# Patient Record
Sex: Female | Born: 1945
Health system: Southern US, Community
[De-identification: ages and names within clinical notes are randomized; demographics above are authoritative.]

## PROBLEM LIST (undated history)

## (undated) DIAGNOSIS — I10 Essential (primary) hypertension: Secondary | ICD-10-CM

## (undated) DIAGNOSIS — M1990 Unspecified osteoarthritis, unspecified site: Secondary | ICD-10-CM

## (undated) DIAGNOSIS — R7303 Prediabetes: Secondary | ICD-10-CM

## (undated) DIAGNOSIS — E785 Hyperlipidemia, unspecified: Secondary | ICD-10-CM

## (undated) DIAGNOSIS — E8881 Metabolic syndrome: Secondary | ICD-10-CM

## (undated) DIAGNOSIS — A0472 Enterocolitis due to Clostridium difficile, not specified as recurrent: Principal | ICD-10-CM

## (undated) DIAGNOSIS — Z8619 Personal history of other infectious and parasitic diseases: Secondary | ICD-10-CM

## (undated) HISTORY — PX: FRACTURE SURGERY: SHX138

## (undated) HISTORY — PX: ABDOMINAL HYSTERECTOMY: SHX81

## (undated) HISTORY — DX: Metabolic syndrome: E88.810

## (undated) HISTORY — DX: Hyperlipidemia, unspecified: E78.5

## (undated) HISTORY — DX: Prediabetes: R73.03

## (undated) HISTORY — DX: Unspecified osteoarthritis, unspecified site: M19.90

## (undated) HISTORY — PX: ANKLE SURGERY: SHX546

## (undated) HISTORY — DX: Metabolic syndrome: E88.81

## (undated) HISTORY — DX: Personal history of other infectious and parasitic diseases: Z86.19

## (undated) HISTORY — DX: Enterocolitis due to Clostridium difficile, not specified as recurrent: A04.72

---

## 2001-08-12 ENCOUNTER — Encounter: Payer: Self-pay | Admitting: Family Medicine

## 2001-08-12 ENCOUNTER — Ambulatory Visit (HOSPITAL_COMMUNITY): Admission: RE | Admit: 2001-08-12 | Discharge: 2001-08-12 | Payer: Self-pay | Admitting: Family Medicine

## 2002-06-02 ENCOUNTER — Ambulatory Visit (HOSPITAL_COMMUNITY): Admission: RE | Admit: 2002-06-02 | Discharge: 2002-06-02 | Payer: Self-pay | Admitting: Family Medicine

## 2002-06-02 ENCOUNTER — Encounter: Payer: Self-pay | Admitting: Family Medicine

## 2002-09-09 ENCOUNTER — Encounter: Payer: Self-pay | Admitting: Family Medicine

## 2002-09-09 ENCOUNTER — Ambulatory Visit (HOSPITAL_COMMUNITY): Admission: RE | Admit: 2002-09-09 | Discharge: 2002-09-09 | Payer: Self-pay | Admitting: Family Medicine

## 2003-05-05 ENCOUNTER — Ambulatory Visit (HOSPITAL_COMMUNITY): Admission: RE | Admit: 2003-05-05 | Discharge: 2003-05-05 | Payer: Self-pay | Admitting: Unknown Physician Specialty

## 2003-05-05 IMAGING — US US SOFT TISSUE HEAD/NECK
1 series · 14 of 25 positions shown · non-contrast
Comparison: none

CLINICAL DATA: Enlarged thyroid.
 ULTRASOUND SOFT TISSUE NECK
 Multiple scans of the thyroid show the right lobe of the thyroid to measure 5.8 x 2.4 x 2.2 cm.  There are multiple nodules seen, the largest measures 1.6 x 1.6 x 2.1 cm.  No cystic areas are seen.  The isthmus thyroid measures 8 mm in thickness and shows no evidence of abnormality.  The left lobe of the thyroid measures 7.3 x 3.2 x 3.3 cm and has multiple nodular areas, but no cystic regions.  
 IMPRESSION
 Multinodular goiter as described above.

[Series 1: unknown · 0.11mm/px · 14 of 45 slices shown]
[im 1/45]
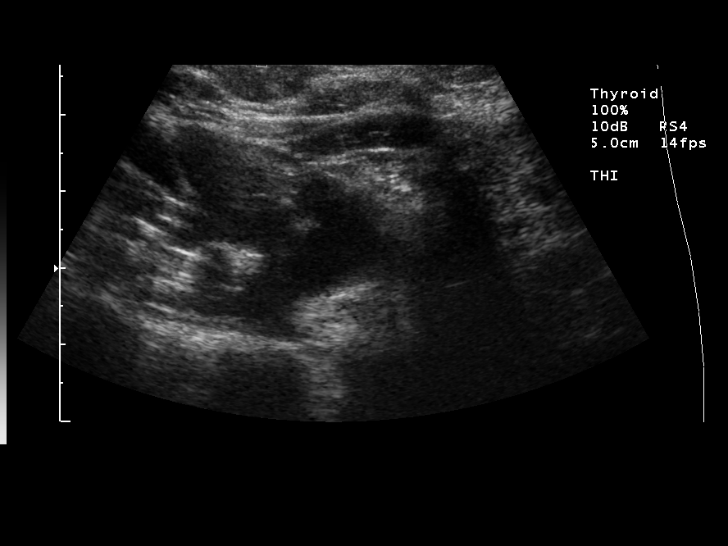
[im 4/45]
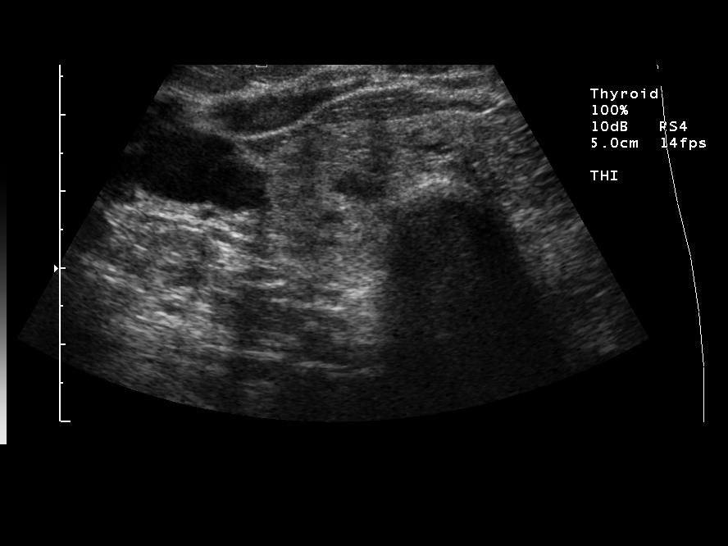
[im 8/45]
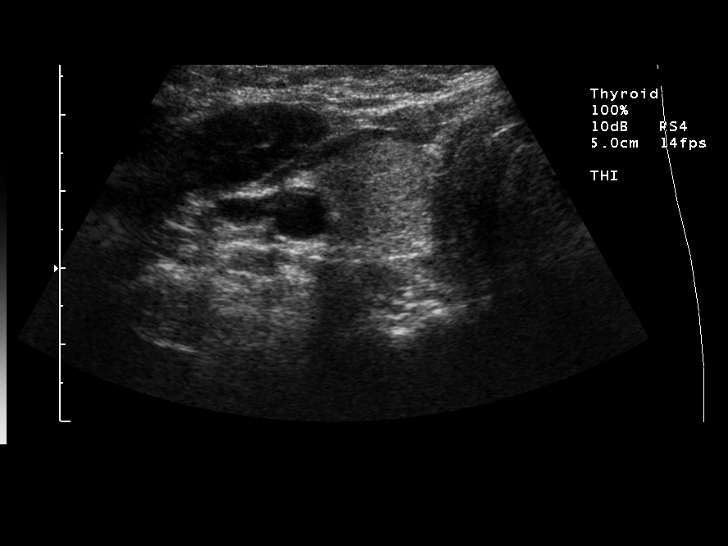
[im 12/45]
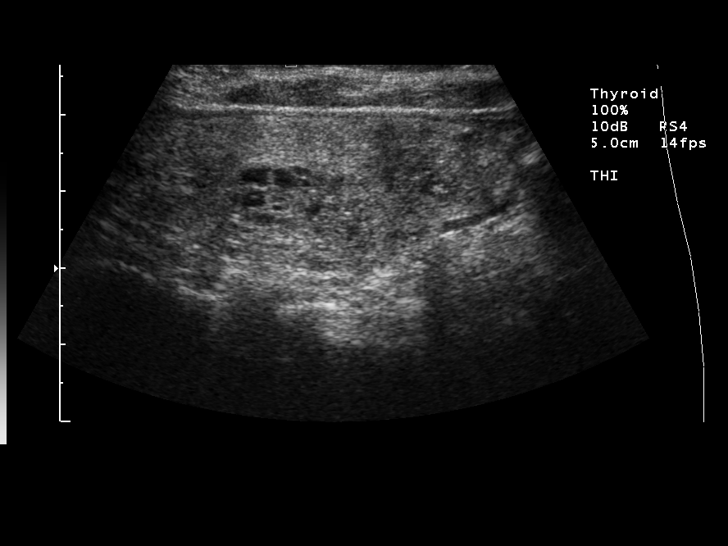
[im 15/45]
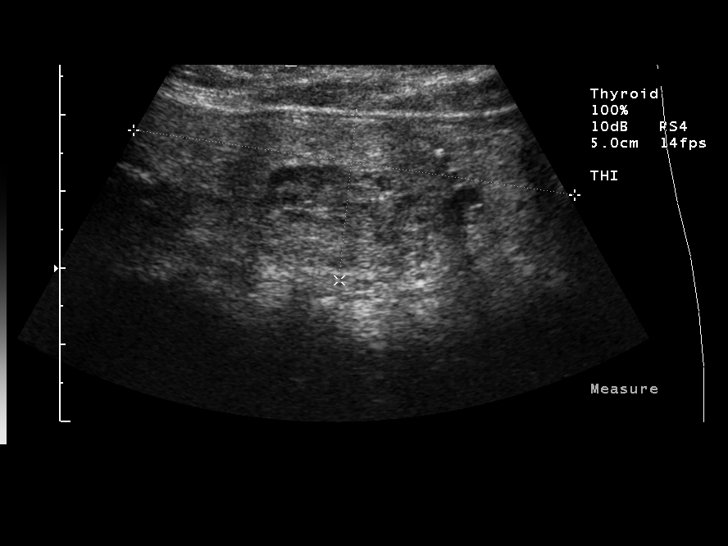
[im 17/45]
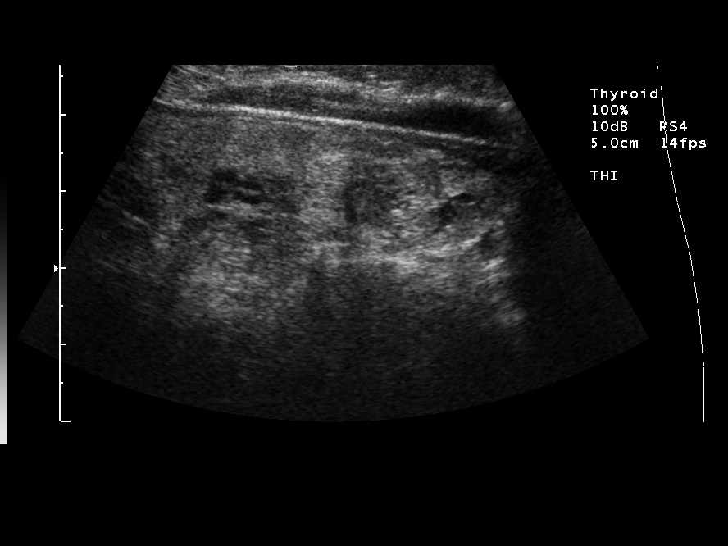
[im 21/45]
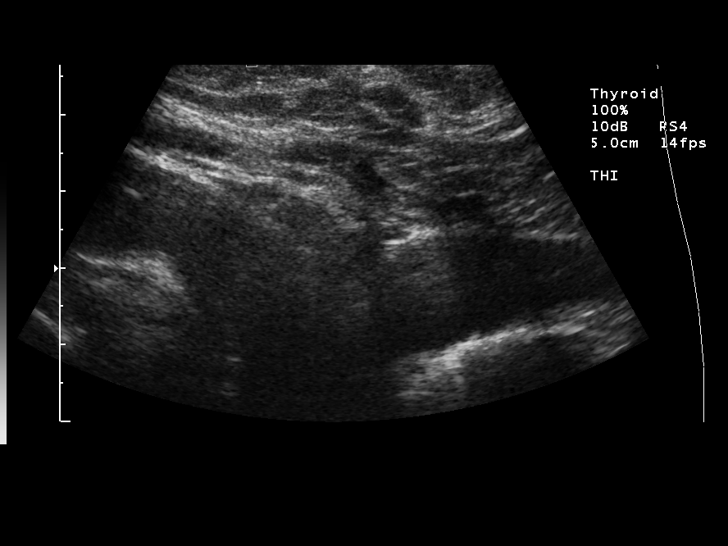
[im 24/45]
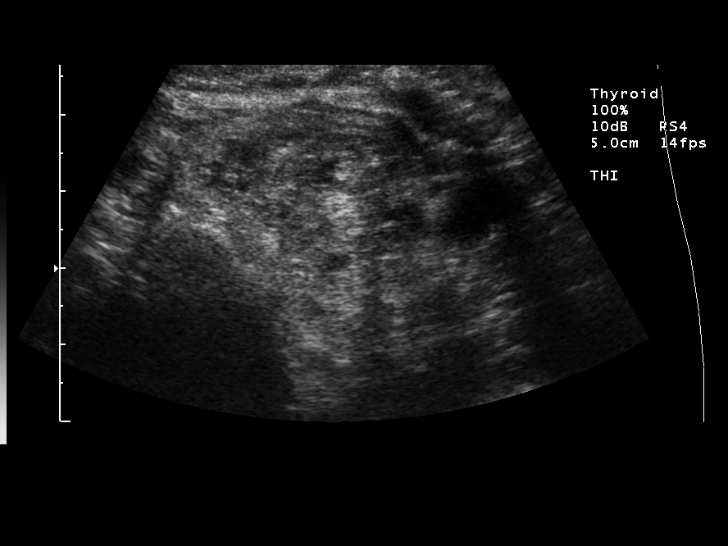
[im 28/45]
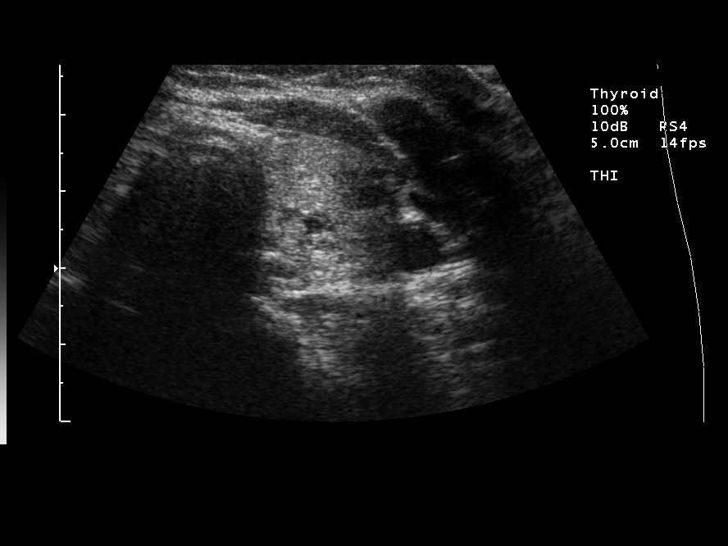
[im 30/45]
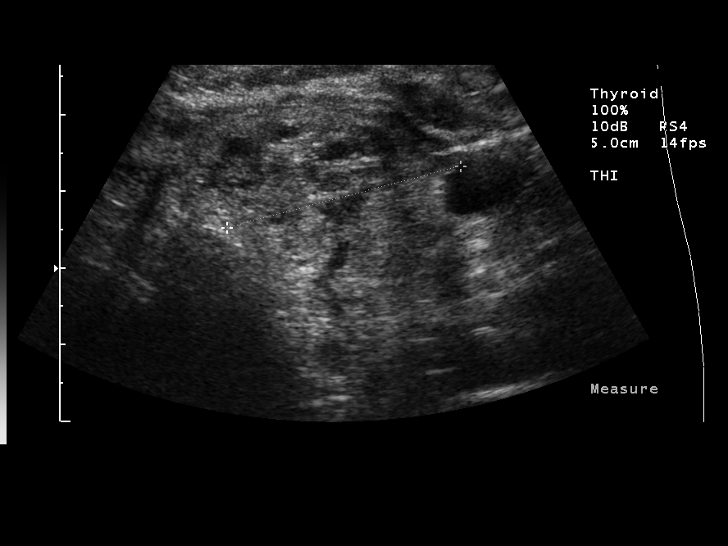
[im 34/45]
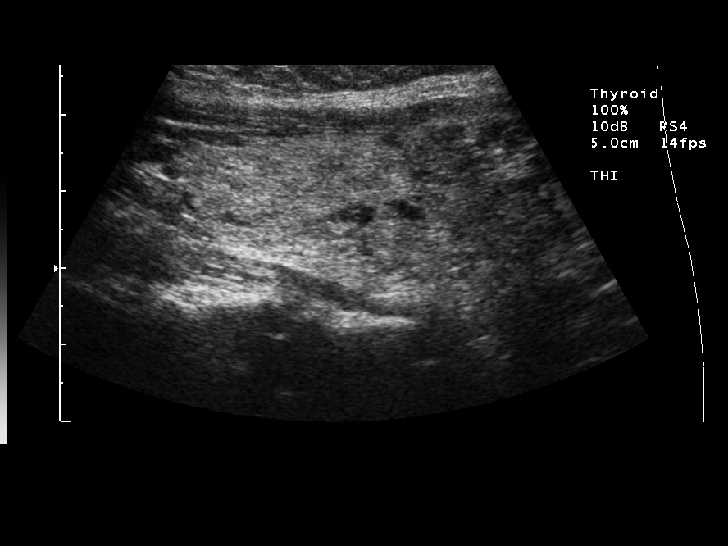
[im 37/45]
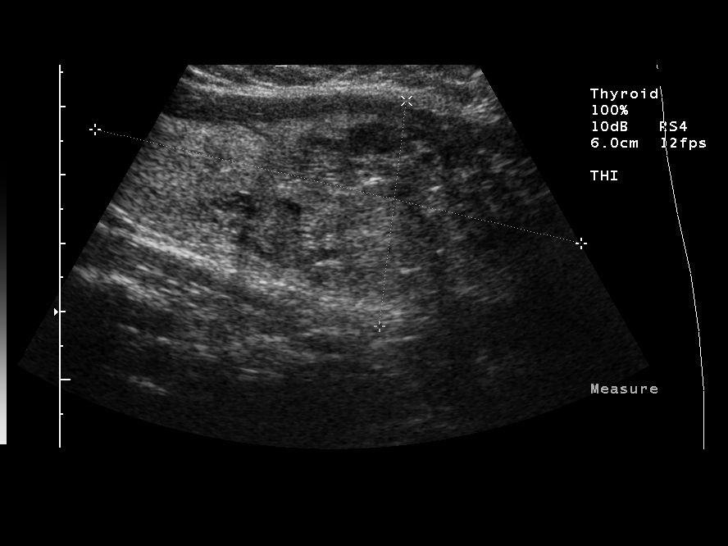
[im 41/45]
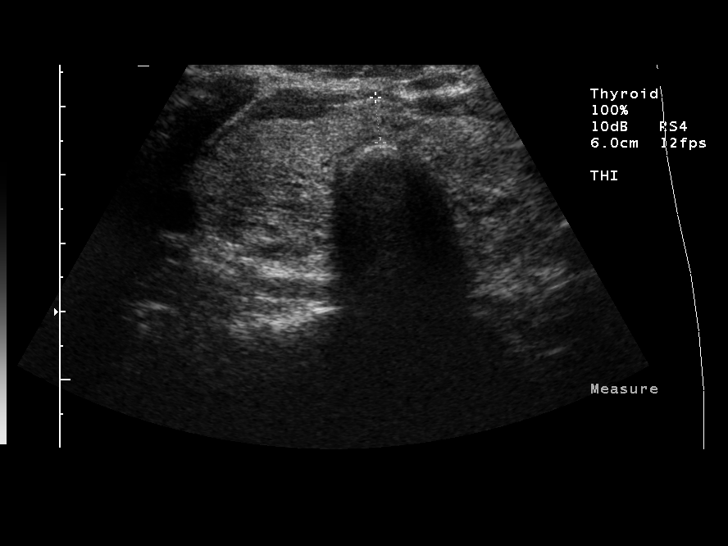
[im 45/45]
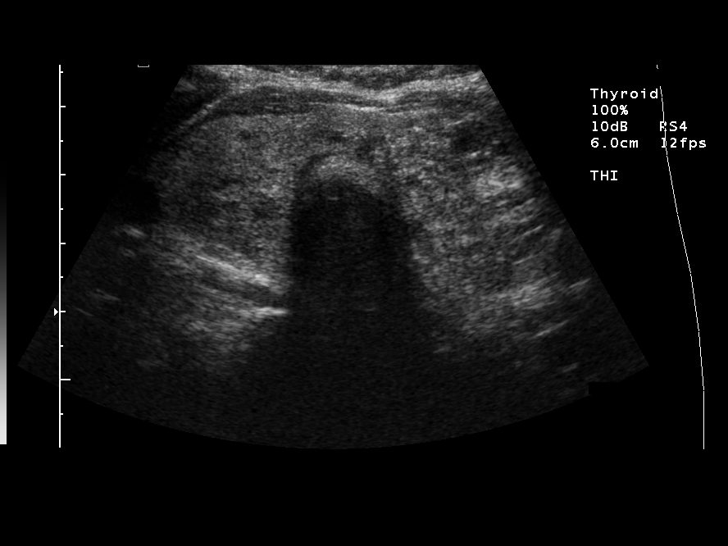

[14 of 25 positions shown; findings below may reference images not displayed]

## 2003-09-11 ENCOUNTER — Ambulatory Visit (HOSPITAL_COMMUNITY): Admission: RE | Admit: 2003-09-11 | Discharge: 2003-09-11 | Payer: Self-pay | Admitting: Family Medicine

## 2004-04-27 ENCOUNTER — Ambulatory Visit: Payer: Self-pay | Admitting: Family Medicine

## 2004-08-25 ENCOUNTER — Ambulatory Visit: Payer: Self-pay | Admitting: Family Medicine

## 2004-11-10 ENCOUNTER — Ambulatory Visit (HOSPITAL_COMMUNITY): Admission: RE | Admit: 2004-11-10 | Discharge: 2004-11-10 | Payer: Self-pay | Admitting: Family Medicine

## 2004-12-26 ENCOUNTER — Ambulatory Visit: Payer: Self-pay | Admitting: Family Medicine

## 2005-03-28 ENCOUNTER — Ambulatory Visit: Payer: Self-pay | Admitting: Family Medicine

## 2005-04-27 ENCOUNTER — Encounter: Payer: Self-pay | Admitting: Internal Medicine

## 2005-04-27 ENCOUNTER — Ambulatory Visit (HOSPITAL_COMMUNITY): Admission: RE | Admit: 2005-04-27 | Discharge: 2005-04-27 | Payer: Self-pay | Admitting: Internal Medicine

## 2005-04-27 ENCOUNTER — Ambulatory Visit: Payer: Self-pay | Admitting: Internal Medicine

## 2005-04-27 HISTORY — PX: COLONOSCOPY: SHX174

## 2005-08-24 ENCOUNTER — Ambulatory Visit: Payer: Self-pay | Admitting: Family Medicine

## 2005-11-13 ENCOUNTER — Ambulatory Visit (HOSPITAL_COMMUNITY): Admission: RE | Admit: 2005-11-13 | Discharge: 2005-11-13 | Payer: Self-pay | Admitting: Family Medicine

## 2005-12-25 ENCOUNTER — Ambulatory Visit: Payer: Self-pay | Admitting: Family Medicine

## 2006-03-29 ENCOUNTER — Ambulatory Visit: Payer: Self-pay | Admitting: Family Medicine

## 2006-05-10 ENCOUNTER — Ambulatory Visit: Payer: Self-pay | Admitting: Family Medicine

## 2006-09-04 ENCOUNTER — Encounter: Payer: Self-pay | Admitting: Family Medicine

## 2006-09-04 LAB — CONVERTED CEMR LAB
ALT: 20 units/L (ref 0–35)
BUN: 17 mg/dL (ref 6–23)
Bilirubin, Direct: 0.2 mg/dL (ref 0.0–0.3)
CO2: 26 meq/L (ref 19–32)
Chloride: 105 meq/L (ref 96–112)
Creatinine, Ser: 0.74 mg/dL (ref 0.40–1.20)
Glucose, Bld: 86 mg/dL (ref 70–99)
Potassium: 3.8 meq/L (ref 3.5–5.3)
Sodium: 143 meq/L (ref 135–145)
Total Protein: 7.2 g/dL (ref 6.0–8.3)
VLDL: 13 mg/dL (ref 0–40)

## 2007-12-28 ENCOUNTER — Encounter: Payer: Self-pay | Admitting: Family Medicine

## 2007-12-28 LAB — CONVERTED CEMR LAB
Alkaline Phosphatase: 67 units/L (ref 39–117)
BUN: 15 mg/dL (ref 6–23)
Bilirubin, Direct: 0.1 mg/dL (ref 0.0–0.3)
CO2: 22 meq/L (ref 19–32)
Chloride: 108 meq/L (ref 96–112)
Indirect Bilirubin: 0.4 mg/dL (ref 0.0–0.9)
LDL Cholesterol: 89 mg/dL (ref 0–99)
Sodium: 140 meq/L (ref 135–145)
Total Bilirubin: 0.5 mg/dL (ref 0.3–1.2)
Total Protein: 7.1 g/dL (ref 6.0–8.3)

## 2008-01-02 DIAGNOSIS — I10 Essential (primary) hypertension: Secondary | ICD-10-CM | POA: Insufficient documentation

## 2008-01-02 DIAGNOSIS — E669 Obesity, unspecified: Secondary | ICD-10-CM | POA: Insufficient documentation

## 2008-01-02 DIAGNOSIS — E785 Hyperlipidemia, unspecified: Secondary | ICD-10-CM | POA: Insufficient documentation

## 2008-01-06 ENCOUNTER — Ambulatory Visit: Payer: Self-pay | Admitting: Family Medicine

## 2008-01-22 ENCOUNTER — Ambulatory Visit (HOSPITAL_COMMUNITY): Admission: RE | Admit: 2008-01-22 | Discharge: 2008-01-22 | Payer: Self-pay | Admitting: Family Medicine

## 2008-04-17 ENCOUNTER — Ambulatory Visit: Payer: Self-pay | Admitting: Family Medicine

## 2008-05-21 ENCOUNTER — Telehealth: Payer: Self-pay | Admitting: Family Medicine

## 2008-05-25 ENCOUNTER — Encounter: Payer: Self-pay | Admitting: Family Medicine

## 2008-05-27 ENCOUNTER — Telehealth: Payer: Self-pay | Admitting: Family Medicine

## 2008-05-28 LAB — CONVERTED CEMR LAB
Alkaline Phosphatase: 82 units/L (ref 39–117)
Basophils Absolute: 0 10*3/uL (ref 0.0–0.1)
Bilirubin, Direct: 0.1 mg/dL (ref 0.0–0.3)
CO2: 23 meq/L (ref 19–32)
Calcium: 9.3 mg/dL (ref 8.4–10.5)
Creatinine, Ser: 0.74 mg/dL (ref 0.40–1.20)
Eosinophils Absolute: 0.1 10*3/uL (ref 0.0–0.7)
Eosinophils Relative: 1 % (ref 0–5)
Glucose, Bld: 98 mg/dL (ref 70–99)
HDL: 49 mg/dL (ref >39)
Hemoglobin: 13.4 g/dL (ref 12.0–15.0)
Lymphocytes Relative: 41 % (ref 12–46)
Lymphs Abs: 1.8 10*3/uL (ref 0.7–4.0)
Monocytes Absolute: 0.4 10*3/uL (ref 0.1–1.0)
Monocytes Relative: 10 % (ref 3–12)
Neutro Abs: 2.1 10*3/uL (ref 1.7–7.7)
Neutrophils Relative %: 47 % (ref 43–77)
Platelets: 362 10*3/uL (ref 150–400)
Potassium: 4.3 meq/L (ref 3.5–5.3)
RBC: 4.72 M/uL (ref 3.87–5.11)
Sodium: 141 meq/L (ref 135–145)
Total Bilirubin: 0.5 mg/dL (ref 0.3–1.2)
Total CHOL/HDL Ratio: 4.1
Triglycerides: 77 mg/dL (ref <150)
WBC: 4.4 10*3/uL (ref 4.0–10.5)

## 2008-06-17 ENCOUNTER — Ambulatory Visit: Payer: Self-pay | Admitting: Family Medicine

## 2008-08-10 ENCOUNTER — Encounter: Payer: Self-pay | Admitting: Family Medicine

## 2008-11-03 ENCOUNTER — Telehealth: Payer: Self-pay | Admitting: Family Medicine

## 2008-11-16 ENCOUNTER — Encounter: Payer: Self-pay | Admitting: Family Medicine

## 2008-11-16 LAB — CONVERTED CEMR LAB
ALT: 29 units/L (ref 0–35)
Alkaline Phosphatase: 71 units/L (ref 39–117)
BUN: 14 mg/dL (ref 6–23)
Bilirubin, Direct: 0.1 mg/dL (ref 0.0–0.3)
Calcium: 8.8 mg/dL (ref 8.4–10.5)
Glucose, Bld: 90 mg/dL (ref 70–99)
HDL: 47 mg/dL (ref >39)
LDL Cholesterol: 151 mg/dL — ABNORMAL HIGH (ref 0–99)
Potassium: 4.3 meq/L (ref 3.5–5.3)
Sodium: 144 meq/L (ref 135–145)
Total Bilirubin: 0.5 mg/dL (ref 0.3–1.2)
Total Protein: 6.9 g/dL (ref 6.0–8.3)
VLDL: 13 mg/dL (ref 0–40)

## 2008-12-02 ENCOUNTER — Ambulatory Visit: Payer: Self-pay | Admitting: Family Medicine

## 2009-01-25 ENCOUNTER — Encounter: Payer: Self-pay | Admitting: Family Medicine

## 2009-02-08 ENCOUNTER — Ambulatory Visit (HOSPITAL_COMMUNITY): Admission: RE | Admit: 2009-02-08 | Discharge: 2009-02-08 | Payer: Self-pay | Admitting: Family Medicine

## 2009-02-08 IMAGING — MG MM DIGITAL SCREENING
4 series · 4 of 4 positions shown · non-contrast
Comparison: Prior studies.

DG SCREEN MAMMOGRAM BILATERAL
Bilateral CC and MLO view(s) were taken.

DIGITAL SCREENING MAMMOGRAM WITH CAD:

[L CC]
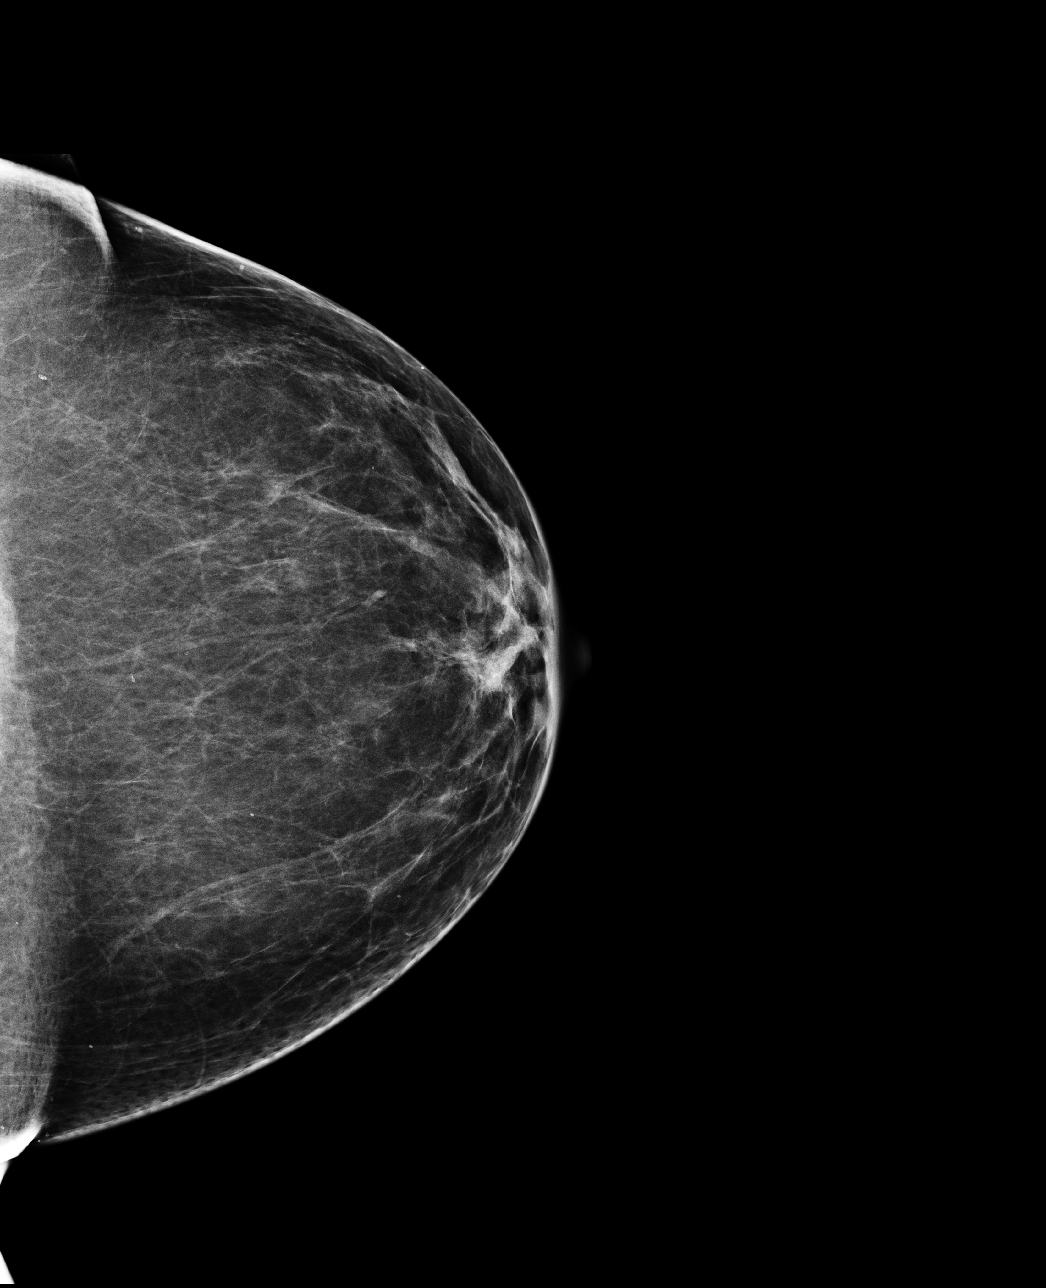

[L MLO]
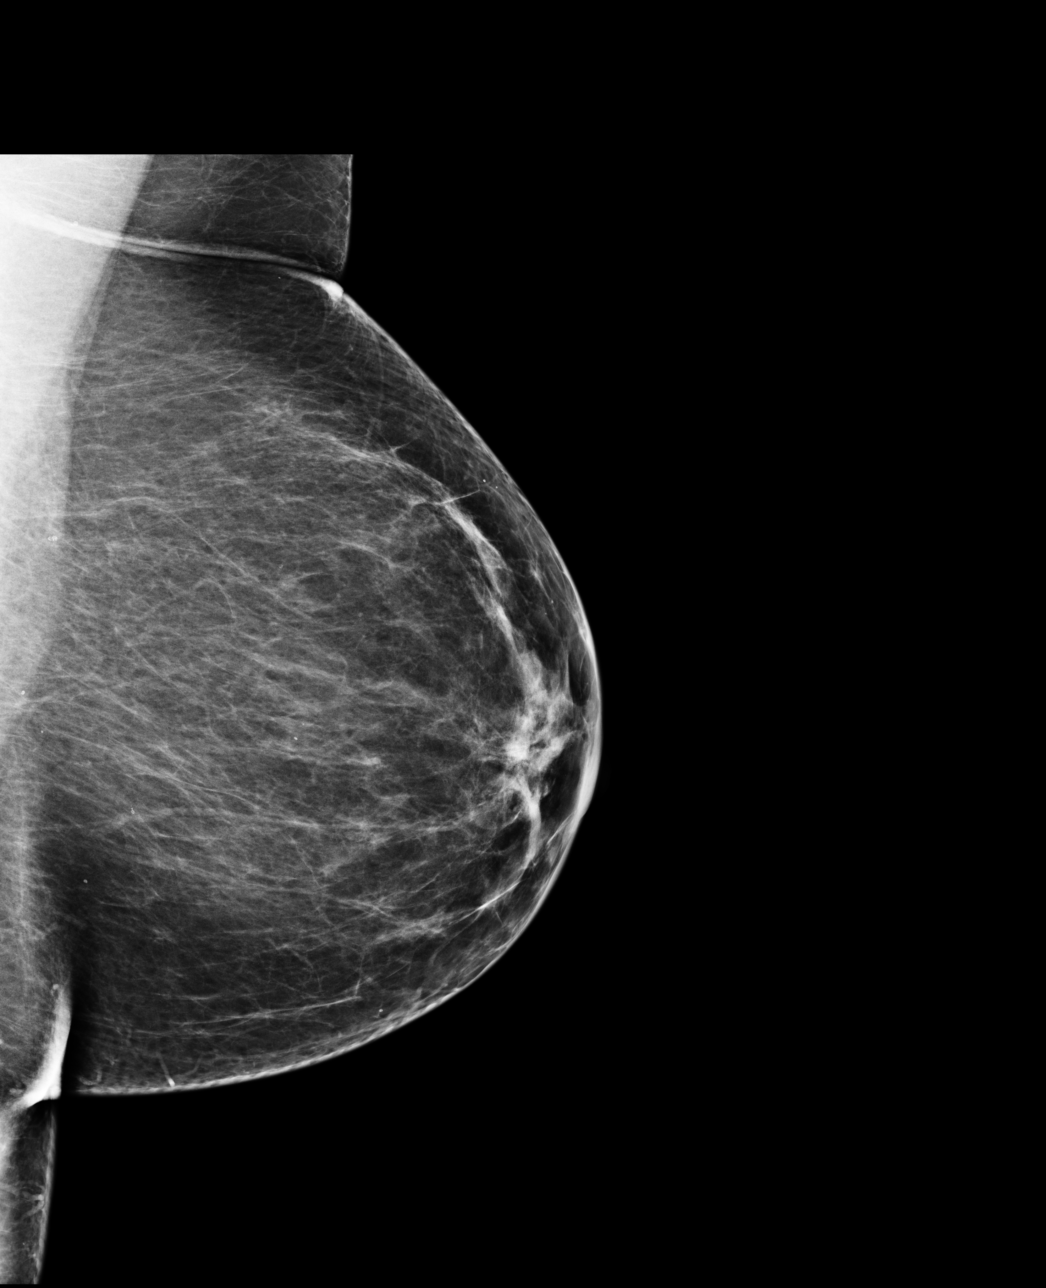

[R CC]
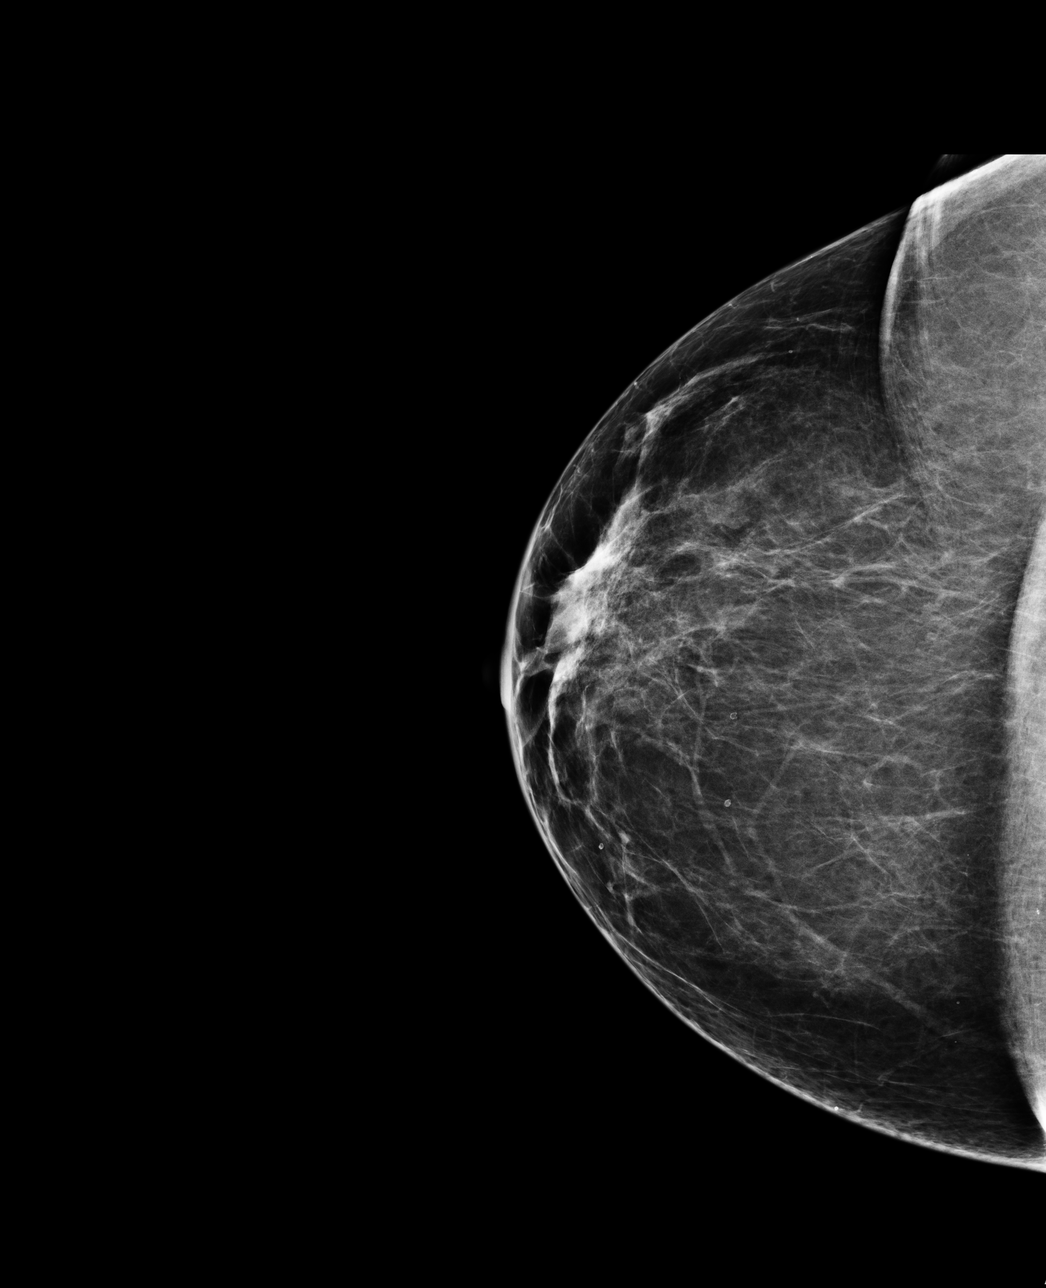

[R MLO]
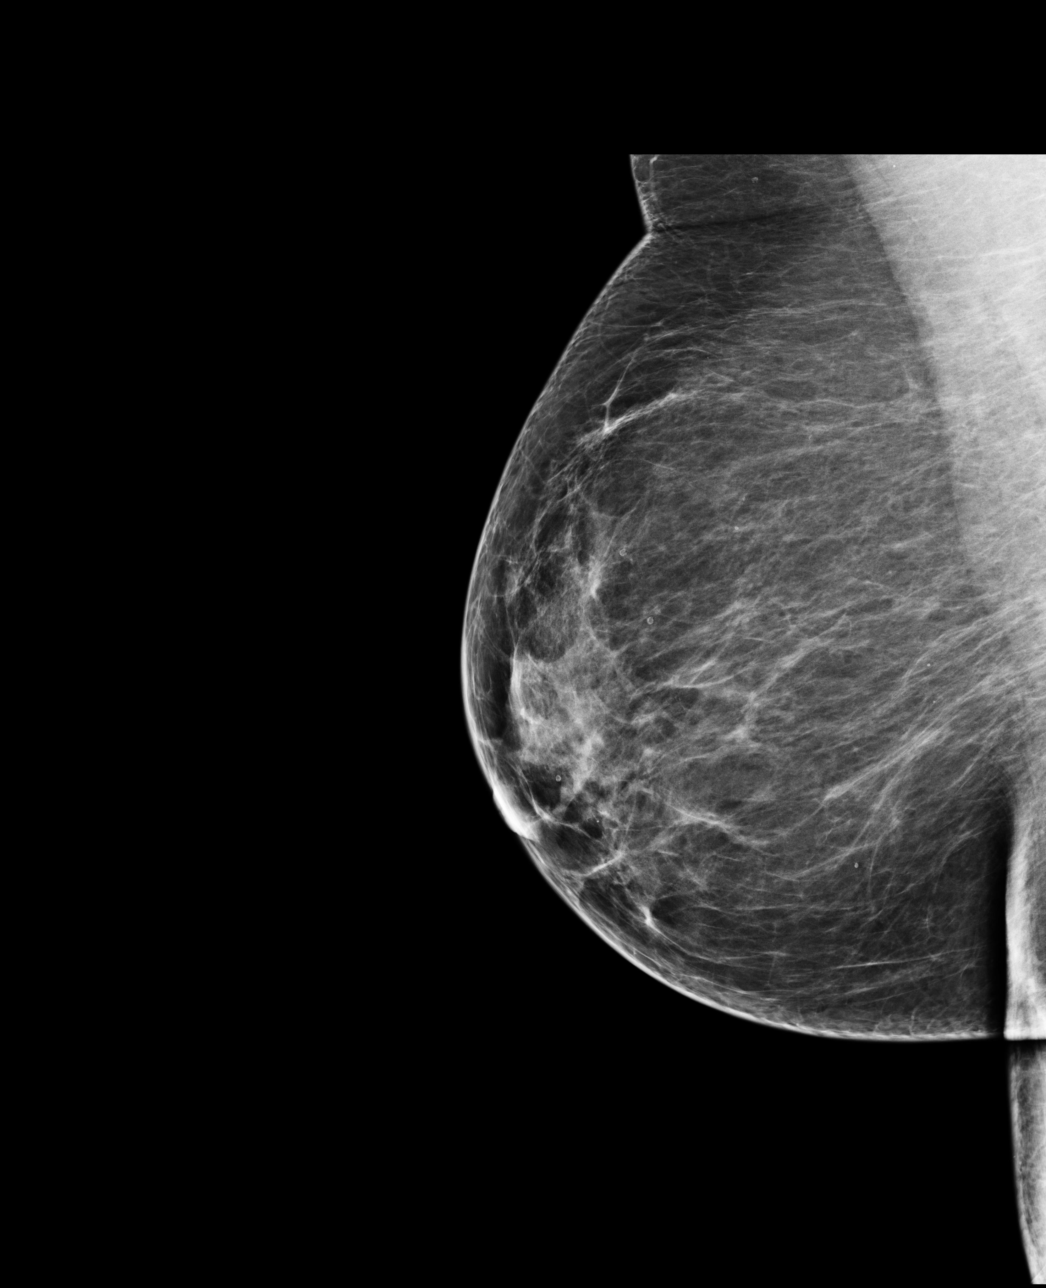

[4 of 4 positions shown; findings below may reference images not displayed]

There are scattered fibroglandular densities.  There is no dominant mass, architectural distortion 
or calcification to suggest malignancy.

Images were processed with CAD.
IMPRESSION: No mammographic evidence of malignancy.  Suggest yearly screening mammography.

A result letter of this screening mammogram will be mailed directly to the patient.

ASSESSMENT: Negative - BI-RADS 1

Screening mammogram in 1 year.
,

## 2009-03-26 ENCOUNTER — Ambulatory Visit: Payer: Self-pay | Admitting: Family Medicine

## 2009-04-26 LAB — CONVERTED CEMR LAB
ALT: 25 units/L (ref 0–35)
AST: 22 units/L (ref 0–37)
Albumin: 4.2 g/dL (ref 3.5–5.2)
Alkaline Phosphatase: 83 units/L (ref 39–117)
Bilirubin, Direct: 0.1 mg/dL (ref 0.0–0.3)
Cholesterol: 169 mg/dL (ref 0–200)
Indirect Bilirubin: 0.5 mg/dL (ref 0.0–0.9)
LDL Cholesterol: 106 mg/dL — ABNORMAL HIGH (ref 0–99)
Total Protein: 7.2 g/dL (ref 6.0–8.3)

## 2009-05-04 ENCOUNTER — Encounter: Payer: Self-pay | Admitting: Family Medicine

## 2009-05-27 ENCOUNTER — Telehealth: Payer: Self-pay | Admitting: Family Medicine

## 2009-11-22 ENCOUNTER — Telehealth: Payer: Self-pay | Admitting: Family Medicine

## 2009-11-23 ENCOUNTER — Ambulatory Visit: Payer: Self-pay | Admitting: Family Medicine

## 2009-11-24 LAB — CONVERTED CEMR LAB
ALT: 24 units/L (ref 0–35)
AST: 26 units/L (ref 0–37)
Basophils Absolute: 0 10*3/uL (ref 0.0–0.1)
Basophils Relative: 0 % (ref 0–1)
Bilirubin, Direct: 0.1 mg/dL (ref 0.0–0.3)
Chloride: 104 meq/L (ref 96–112)
Creatinine, Ser: 0.79 mg/dL (ref 0.40–1.20)
Eosinophils Relative: 1 % (ref 0–5)
HDL: 49 mg/dL (ref >39)
Hemoglobin: 12.8 g/dL (ref 12.0–15.0)
MCHC: 31.7 g/dL (ref 30.0–36.0)
Neutro Abs: 2 10*3/uL (ref 1.7–7.7)
RDW: 13.1 % (ref 11.5–15.5)
Sodium: 141 meq/L (ref 135–145)
VLDL: 13 mg/dL (ref 0–40)
WBC: 4.6 10*3/uL (ref 4.0–10.5)

## 2010-02-21 ENCOUNTER — Telehealth: Payer: Self-pay | Admitting: Family Medicine

## 2010-03-22 ENCOUNTER — Ambulatory Visit: Payer: Self-pay | Admitting: Family Medicine

## 2010-03-22 DIAGNOSIS — B029 Zoster without complications: Secondary | ICD-10-CM | POA: Insufficient documentation

## 2010-03-24 ENCOUNTER — Telehealth: Payer: Self-pay | Admitting: Family Medicine

## 2010-05-23 ENCOUNTER — Telehealth: Payer: Self-pay | Admitting: Family Medicine

## 2010-07-12 NOTE — Progress Notes (Signed)
Summary: speak with nurse  Phone Note Call from Patient   Summary of Call: needs to get nurse to call back for cream [phone] Initial call taken by: Rudene Anda,  March 24, 2010 8:50 AM  Follow-up for Phone Call        Wants cream for the shingles also Zovirax ? Can she have some sent to her pharmacy. I see its the same thing as the pills. Can she even take those together?  WM eden Follow-up by: Everitt Amber LPN,  March 24, 2010 1:56 PM  Additional Follow-up for Phone Call Additional follow up Details #1::        Advise pt that the treatment for shingles is the prescription pills I gave her.  We could prescription the cream to use in addition, just so she could put something on topically, but it really is just a waste of her money since she has no insurance.  Additional Follow-up by: Esperanza Sheets PA,  March 24, 2010 3:14 PM    Additional Follow-up for Phone Call Additional follow up Details #2::    Patient aware Follow-up by: Everitt Amber LPN,  March 24, 2010 3:46 PM

## 2010-07-12 NOTE — Assessment & Plan Note (Signed)
Summary: office visit   Vital Signs:  Patient profile:   65 year old female Menstrual status:  hysterectomy Height:      65 inches Weight:      210 pounds BMI:     35.07 O2 Sat:      99 % Pulse rate:   78 / minute BP sitting:   124 / 72  (right arm) CC: need refill on medication and she states crestor is too expensive, and to get lab work--rm #2 Is Patient Diabetic? No   CC:  need refill on medication and she states crestor is too expensive and and to get lab work--rm #2.  History of Present Illness: Pt is here today for check up.  Hx of hyperlipidemia.  Has been out of Crestor x 2 mos.  Due to expense.  Is currently taking Fish oil 1200mg  1 daily.  Pt states with her last labs she was also taking fish oil & had reduced her Crestor to twice a week.  Watching her diet.  Not exercising.  States she took Lipitor in the past.  Hx of htn. Taking medications as prescribed. No headache, chest pain or palpitations.  Pts mother is terminally ill. She is spending her evenings caring for her mother. Was walking for exercise regularly but has stopped.  Pt has lost some wt & hopes to resume exercise in the future.     Allergies: No Known Drug Allergies  Past History:  Past medical history reviewed for relevance to current acute and chronic problems.  Past Medical History: Reviewed history from 01/02/2008 and no changes required. Current Problems:  OBESITY (ICD-278.00) HYPERLIPIDEMIA (ICD-272.4) HYPERTENSION (ICD-401.9)  Review of Systems General:  Denies chills and fever. ENT:  Denies nasal congestion and sore throat. CV:  Denies chest pain or discomfort and palpitations. Resp:  Denies cough and shortness of breath. GI:  Denies abdominal pain, change in bowel habits, indigestion, nausea, and vomiting. Allergy:  Denies seasonal allergies and sneezing.  Physical Exam  General:  Well-developed,well-nourished,in no acute distress; alert,appropriate and cooperative throughout  examination Head:  Normocephalic and atraumatic without obvious abnormalities. No apparent alopecia or balding. Ears:  External ear exam shows no significant lesions or deformities.  Otoscopic examination reveals clear canals, tympanic membranes are intact bilaterally without bulging, retraction, inflammation or discharge. Hearing is grossly normal bilaterally. Nose:  External nasal examination shows no deformity or inflammation. Nasal mucosa are pink and moist without lesions or exudates. Mouth:  Oral mucosa and oropharynx without lesions or exudates.   Neck:  No deformities, masses, or tenderness noted. Lungs:  Normal respiratory effort, chest expands symmetrically. Lungs are clear to auscultation, no crackles or wheezes. Heart:  Normal rate and regular rhythm. S1 and S2 normal without gallop, murmur, click, rub or other extra sounds. Cervical Nodes:  No lymphadenopathy noted Psych:  Cognition and judgment appear intact. Alert and cooperative with normal attention span and concentration. No apparent delusions, illusions, hallucinations   Impression & Recommendations:  Problem # 1:  HYPERTENSION (ICD-401.9) Assessment Improved  Her updated medication list for this problem includes:    Lisinopril-hydrochlorothiazide 20-12.5 Mg Tabs (Lisinopril-hydrochlorothiazide) .Marland Kitchen... Take 1 tablet by mouth once a day  BP today: 124/72 Prior BP: 140/76 (12/02/2008)  Labs Reviewed: K+: 4.3 (11/16/2008) Creat: : 0.76 (11/16/2008)   Chol: 169 (04/17/2009)   HDL: 47 (04/17/2009)   LDL: 106 (04/17/2009)   TG: 78 (04/17/2009)  Problem # 2:  HYPERLIPIDEMIA (ICD-272.4) Assessment: Comment Only  Her updated medication list for this  problem includes:    Crestor 10 Mg Tabs (Rosuvastatin calcium) ..... One tab by mouth at bedtime  Problem # 3:  OBESITY (ICD-278.00) Assessment: Improved Discussed healthy diet and exercise.  Ht: 65 (11/23/2009)   Wt: 210 (11/23/2009)   BMI: 35.07 (11/23/2009)  Complete  Medication List: 1)  Anacin 81 Mg Tbec (Aspirin) .... One tab by mouth once daily 2)  Calcium 600 1500 Mg Tabs (Calcium carbonate) .... Two and half tabs by mouth once daily 3)  Fish Oil 1000 Mg Caps (Omega-3 fatty acids) .... One cap by mouth once daily 4)  Crestor 10 Mg Tabs (Rosuvastatin calcium) .... One tab by mouth at bedtime 5)  Lisinopril-hydrochlorothiazide 20-12.5 Mg Tabs (Lisinopril-hydrochlorothiazide) .... Take 1 tablet by mouth once a day  Patient Instructions: 1)  Please schedule a follow-up appointment in 3 months with Dr Lodema Hong.  You should have a physical at this time. 2)  It is important that you exercise regularly at least 20 minutes 5 times a week. If you develop chest pain, have severe difficulty breathing, or feel very tired , stop exercising immediately and seek medical attention. 3)  You need to lose weight. Consider a lower calorie diet and regular exercise.  4)  I have refilled your blood pressure medication. 5)  Wait for your cholesterol results to make a decision regarding your cholesterol medication. Prescriptions: LISINOPRIL-HYDROCHLOROTHIAZIDE 20-12.5 MG TABS (LISINOPRIL-HYDROCHLOROTHIAZIDE) Take 1 tablet by mouth once a day  #90 x 3   Entered and Authorized by:   Esperanza Sheets PA   Signed by:   Esperanza Sheets PA on 11/23/2009   Method used:   Electronically to        [facility] (retail)       [address]       [place], [place]  [address]       Ph: [phone]       Fax: [phone]   RxID:   [id number]

## 2010-07-12 NOTE — Progress Notes (Signed)
Summary: lab order  Phone Note Call from Patient   Summary of Call: pt coming in to see Dawn tomorrow and she wants jamie to fax over a lab order on her. [phone] Initial call taken by: Rudene Anda,  November 22, 2009 9:04 AM  Follow-up for Phone Call        what labs will this patient need? Follow-up by: Adella Hare LPN,  November 22, 2009 9:10 AM  Additional Follow-up for Phone Call Additional follow up Details #1::        fasting cbc, chem7 , lipidds, hepatic Additional Follow-up by: Syliva Overman MD,  November 22, 2009 10:15 AM    Additional Follow-up for Phone Call Additional follow up Details #2::    lab order sent Follow-up by: Adella Hare LPN,  November 22, 2009 11:16 AM

## 2010-07-12 NOTE — Letter (Signed)
Summary: med review sheet  med review sheet   Imported By: Rudene Anda 03/22/2010 16:00:47  _____________________________________________________________________  External Attachment:    Type:   Image     Comment:   External Document

## 2010-07-12 NOTE — Progress Notes (Signed)
Summary: Lab work please advise  Phone Note Call from Patient   Summary of Call: patient had an appt. for Wednesday the 14th of Sept. however she had to change it b/c she no longer has insurance she resch. for the 18th of Oct. she would like you to fax order for lab work to the lab if she needs any.  Thanks  Initial call taken by: Curtis Sites,  February 21, 2010 4:26 PM  Follow-up for Phone Call        fasting lipid  needed Follow-up by: Syliva Overman MD,  February 21, 2010 4:56 PM  Additional Follow-up for Phone Call Additional follow up Details #1::        order faxed as requested Additional Follow-up by: Adella Hare LPN,  February 23, 2010 11:43 AM

## 2010-07-12 NOTE — Progress Notes (Signed)
Summary: BP MEDS  Phone Note Call from Patient   Summary of Call: NEEDS HER BP MEDICINE FILLED AT [facility] [place]  CALL BACK AT [phone] Initial call taken by: Lind Guest,  November 22, 2009 8:33 AM  Follow-up for Phone Call        one month only, needs appt Follow-up by: Adella Hare LPN,  November 22, 2009 9:02 AM    Prescriptions: LISINOPRIL-HYDROCHLOROTHIAZIDE 20-12.5 MG TABS (LISINOPRIL-HYDROCHLOROTHIAZIDE) Take 1 tablet by mouth once a day  #30 x 0   Entered by:   Adella Hare LPN   Authorized by:   Syliva Overman MD   Signed by:   Adella Hare LPN on 01/60/1093   Method used:   Electronically to        [facility] (retail)       [address]       [place], [place]  [address]       Ph: [phone]       Fax: [phone]   RxID:   [id number]

## 2010-07-12 NOTE — Assessment & Plan Note (Signed)
Summary: office visit   Vital Signs:  Patient profile:   65 year old female Menstrual status:  hysterectomy Height:      65 inches Weight:      218.25 pounds BMI:     36.45 O2 Sat:      99 % Pulse rate:   90 / minute Resp:     16 per minute BP sitting:   134 / 80  (left arm) Cuff size:   large  Vitals Entered By: Everitt Amber LPN  CC: Has been hurting from the waste down, started today when she bent over to get something out of the fridge. Also has a red rash that doesn't itch or burn on left hip that just came up    CC:  Has been hurting from the waste down and started today when she bent over to get something out of the fridge. Also has a red rash that doesn't itch or burn on left hip that just came up .  History of Present Illness: Pt reports aching discomfort in lower back and Lt leg since she awoke this am.  Noticed when she got in the shower that she has a rash on her Lt hip.  She is wondering if this is shingles.  Hx of aching and discomfort in Rt lower leg since ankle injury yrs ago.  Took Ibuprofen 600 mg this am. Pt has no health ins at this time.    Current Medications (verified): 1)  Anacin 81 Mg  Tbec (Aspirin) .... One Tab By Mouth Once Daily 2)  Calcium 600 1500 Mg  Tabs (Calcium Carbonate) .... Two and Half Tabs By Mouth Once Daily 3)  Fish Oil 1000 Mg  Caps (Omega-3 Fatty Acids) .... One Cap By Mouth Once Daily 4)  Lisinopril-Hydrochlorothiazide 20-12.5 Mg Tabs (Lisinopril-Hydrochlorothiazide) .... Take 1 Tablet By Mouth Once A Day 5)  Simvastatin 40 Mg Tabs (Simvastatin) .... One Tab By Mouth At Bedtime  Allergies (verified): 1)  ! * Pcn  Past History:  Past medical history reviewed for relevance to current acute and chronic problems.  Past Medical History: Reviewed history from 01/02/2008 and no changes required. Current Problems:  OBESITY (ICD-278.00) HYPERLIPIDEMIA (ICD-272.4) HYPERTENSION (ICD-401.9)  Review of Systems General:  Denies chills and  fever. GI:  Denies abdominal pain, change in bowel habits, nausea, and vomiting. GU:  Denies dysuria and urinary frequency.  Physical Exam  General:  Well-developed,well-nourished,in no acute distress; alert,appropriate and cooperative throughout examination Lungs:  Normal respiratory effort, chest expands symmetrically. Lungs are clear to auscultation, no crackles or wheezes. Heart:  Normal rate and regular rhythm. S1 and S2 normal without gallop, murmur, click, rub or other extra sounds. Msk:  Lumbar spinous process and musculature nontender. Neurologic:  alert & oriented X3, sensation intact to light touch, and gait normal.   Skin:  Lt hip erythematous macular and papular rash, grouped. Psych:  Cognition and judgment appear intact. Alert and cooperative with normal attention span and concentration. No apparent delusions, illusions, hallucinations   Impression & Recommendations:  Problem # 1:  HERPES ZOSTER (ICD-053.9) Assessment New  Complete Medication List: 1)  Anacin 81 Mg Tbec (Aspirin) .... One tab by mouth once daily 2)  Calcium 600 1500 Mg Tabs (Calcium carbonate) .... Two and half tabs by mouth once daily 3)  Fish Oil 1000 Mg Caps (Omega-3 fatty acids) .... One cap by mouth once daily 4)  Lisinopril-hydrochlorothiazide 20-12.5 Mg Tabs (Lisinopril-hydrochlorothiazide) .... Take 1 tablet by mouth once a day  5)  Simvastatin 40 Mg Tabs (Simvastatin) .... One tab by mouth at bedtime 6)  Acyclovir 200 Mg Caps (Acyclovir) .... Take 4 caps 5 times a day as directed  Patient Instructions: 1)  Please schedule a follow-up appointment as needed. 2)  You are being treated for shingles.  Take Acyclovir as directed. 3)  You may use Ibuprofen as needed for discomfort.  If you feel that you need something stronger please let us know. Prescriptions: ACYCLOVIR 200 MG CAPS (ACYCLOVIR) take 4 caps 5 times a day as directed  #90 x 1   Entered and Authorized by:   Esperanza Sheets PA   Signed by:    Esperanza Sheets PA on 03/22/2010   Method used:   Electronically to        [facility] (retail)       [address]       [place], [place]  [address]       Ph: [phone]       Fax: [phone]   RxID:   [id number]

## 2010-07-14 NOTE — Progress Notes (Signed)
Summary: MEDCIATION  Phone Note Call from Patient   Summary of Call: PATIENT CALLED IN STATES THAT SHE HAS BEEN TAKING MOTHER MEDICATION LIPATOR FOR CHOLESTROL BECAUSE SIMVASTAIN 40 MG GIVES HER MUSCLE CRAMPS WOULD RATHER TAKE LIPATOR.. AND WANTED KNOW ABOUT THE LAB WORK.  PLEASE CALLED BACK @ [phone]  Initial call taken by: Eugenio Hoes,  May 23, 2010 10:58 AM  Follow-up for Phone Call        cholesterol was high in june, labs otherwise normal, she needs ov in jan/feb with rept lipid and hepatic at that time, let her know Follow-up by: Syliva Overman MD,  May 24, 2010 6:27 AM  Additional Follow-up for Phone Call Additional follow up Details #1::        can she have lipitor instead of simvastatin Additional Follow-up by: Adella Hare LPN,  May 24, 2010 2:38 PM    Additional Follow-up for Phone Call Additional follow up Details #2::    ok lipitor 40mg  Take 1 tab by mouth at bedtime #30 refill 2 pls send in after you spk with her Follow-up by: Syliva Overman MD,  May 25, 2010 6:38 AM  Additional Follow-up for Phone Call Additional follow up Details #3:: Details for Additional Follow-up Action Taken: called patient, left message Additional Follow-up by: Adella Hare LPN,  May 25, 2010 10:12 AM  called patient, no answer Adella Hare LPN  May 26, 2010 8:25 AM

## 2010-08-18 ENCOUNTER — Other Ambulatory Visit: Payer: Self-pay | Admitting: Family Medicine

## 2010-08-18 DIAGNOSIS — Z139 Encounter for screening, unspecified: Secondary | ICD-10-CM

## 2010-08-23 ENCOUNTER — Ambulatory Visit (HOSPITAL_COMMUNITY)
Admission: RE | Admit: 2010-08-23 | Discharge: 2010-08-23 | Disposition: A | Discharge status: Home / Self Care | Payer: Self-pay | Source: Ambulatory Visit | Attending: Family Medicine | Admitting: Family Medicine

## 2010-08-23 DIAGNOSIS — Z139 Encounter for screening, unspecified: Secondary | ICD-10-CM

## 2010-08-23 IMAGING — MG MM DIGITAL [PERSON_NAME] SCREEN BILAT {[facility]}
5 series · 5 of 5 positions shown · non-contrast
Comparison: none

DG FRANTZ SCREENING BILAT
Bilateral CC and MLO view(s) were taken.

DIGITAL SCREENING MAMMOGRAM WITH CAD:
There are scattered fibroglandular densities.  No masses or malignant type calcifications are 
identified.  Compared with prior studies.
Images were processed with CAD.

[L CC (1 of 2)]
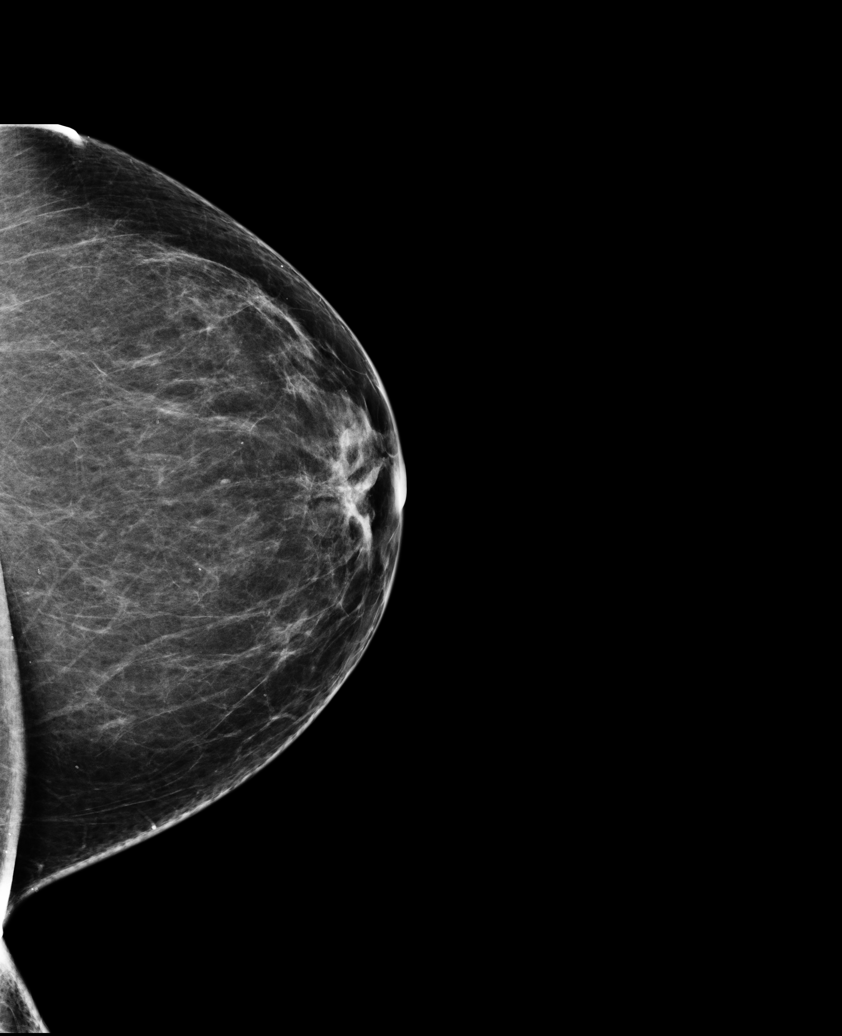

[L MLO]
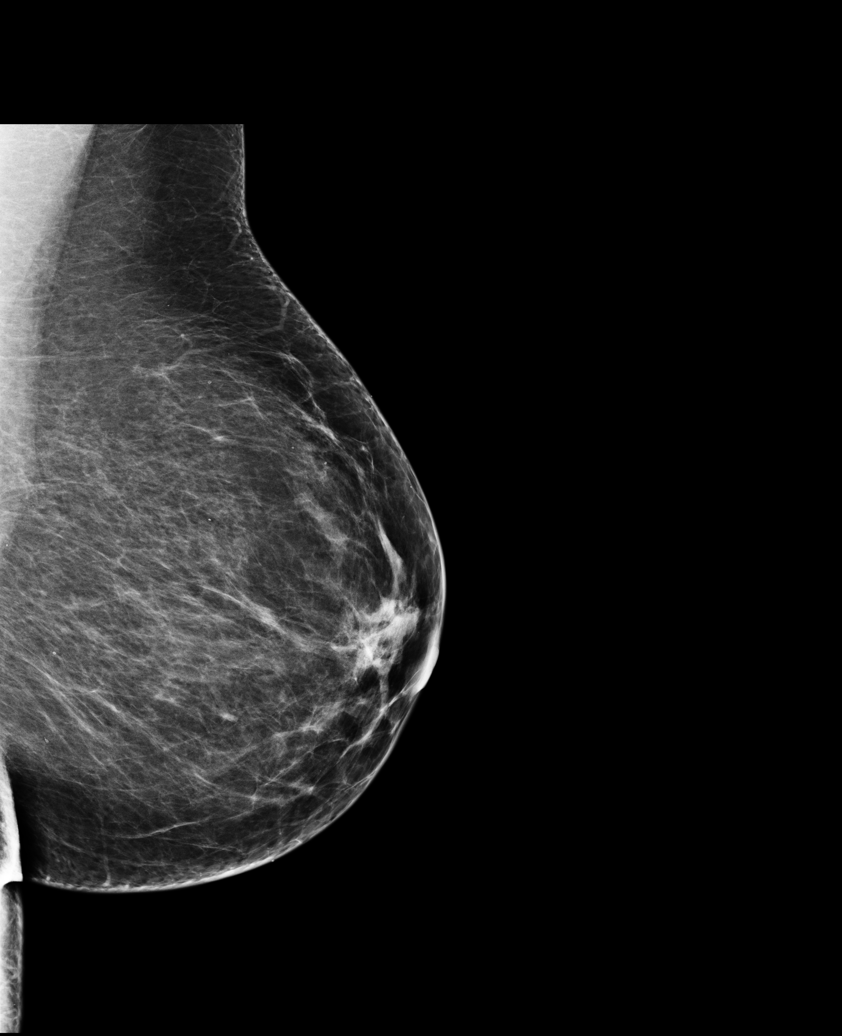

[R CC]
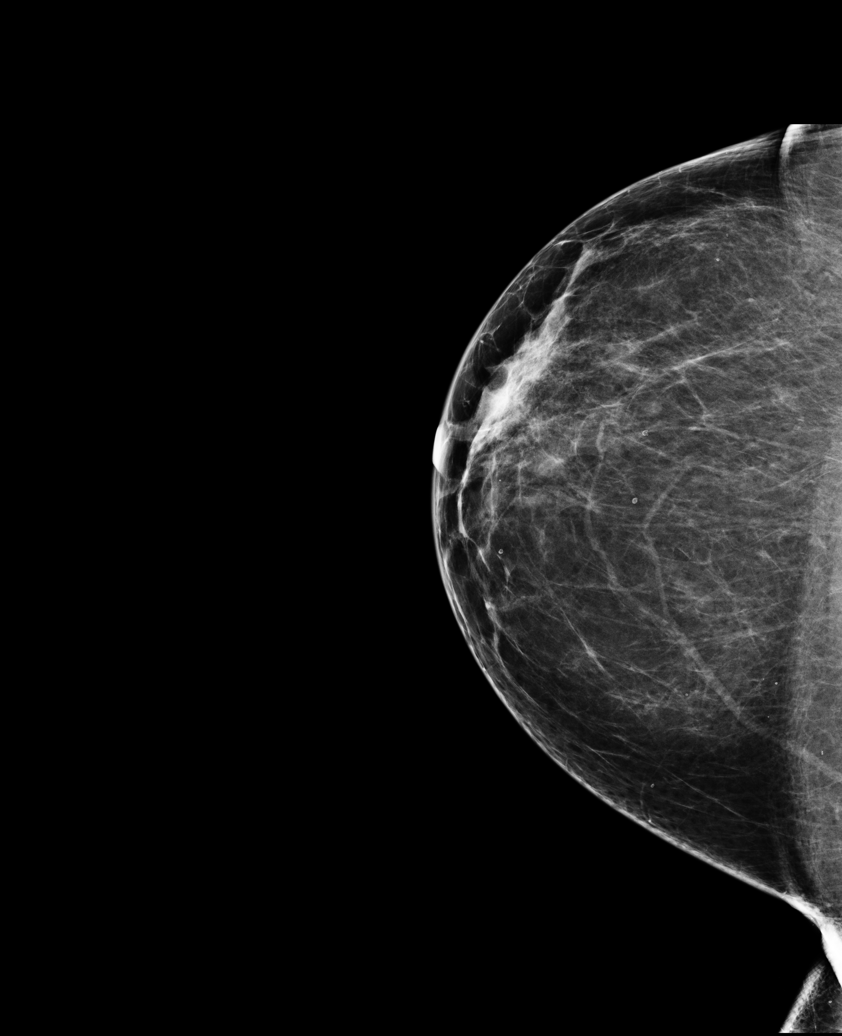

[R MLO]
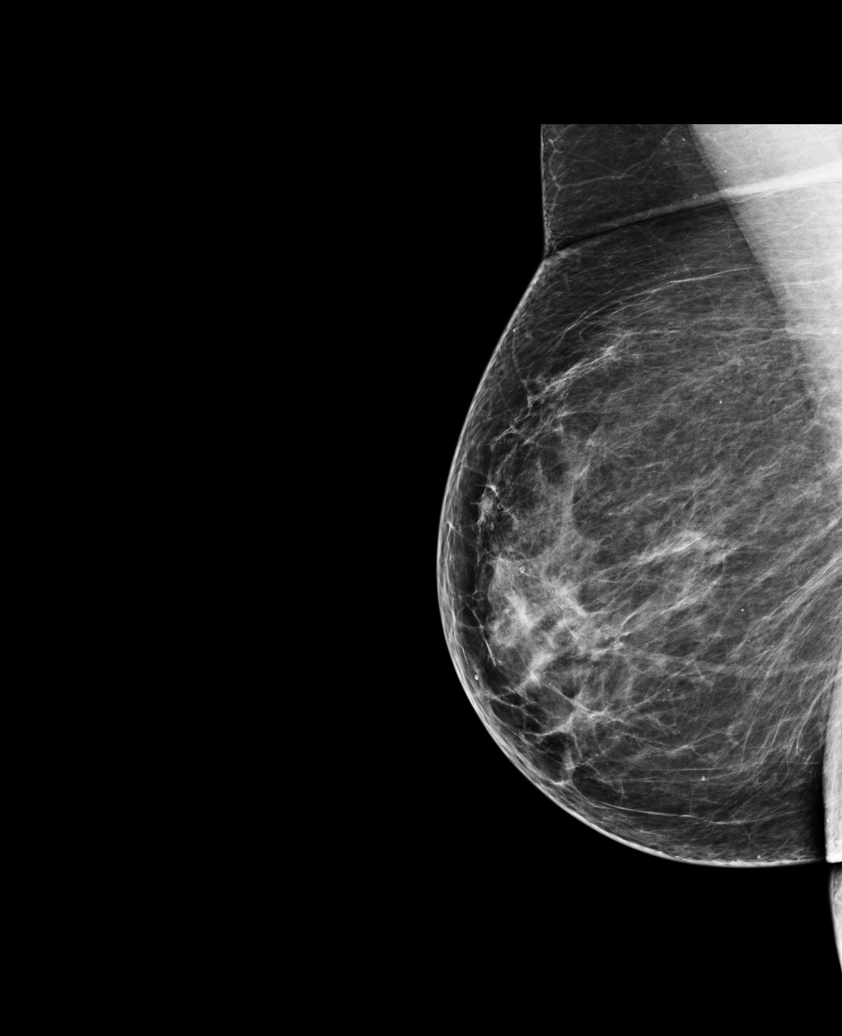

[L CC (2 of 2)]
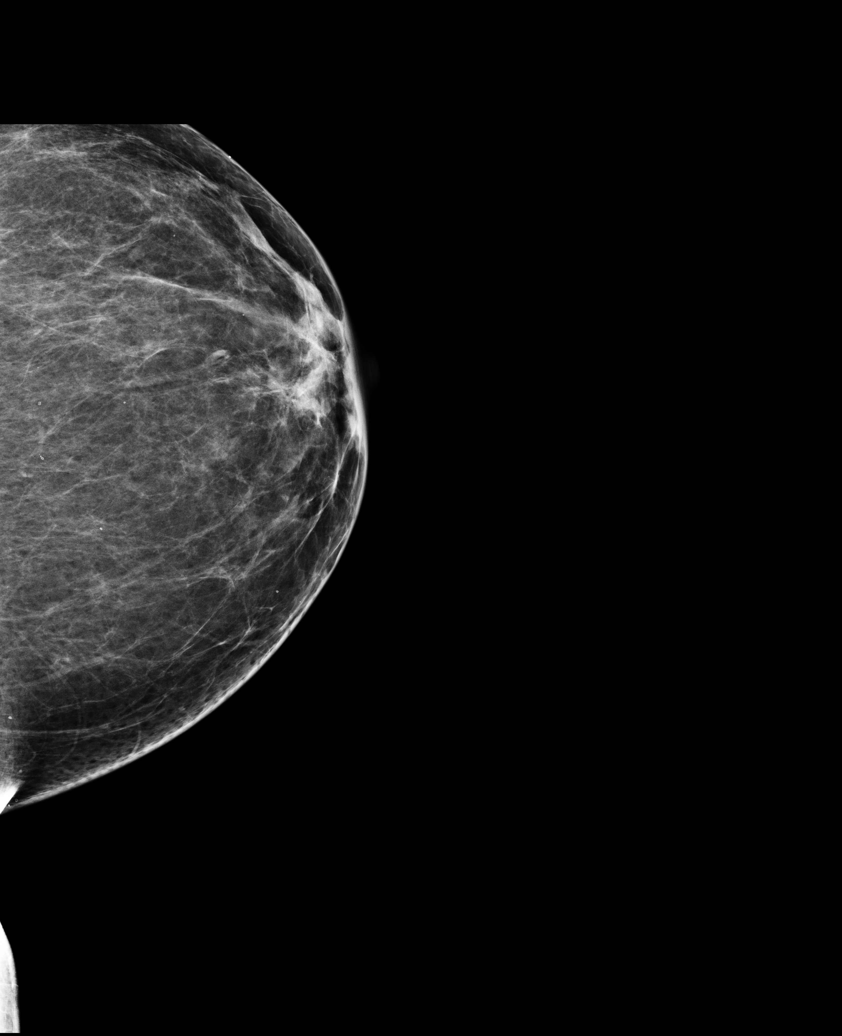

[5 of 5 positions shown; findings below may reference images not displayed]

IMPRESSION: No specific mammographic evidence of malignancy.  Next screening mammogram is recommended in one 
year.

A result letter of this screening mammogram will be mailed directly to the patient.

ASSESSMENT: Negative - BI-RADS 1

Screening mammogram in 1 year.
,

## 2010-10-28 NOTE — Op Note (Signed)
NAME:  Karen [name], Karen [name]              ACCOUNT NO.:  [id number]   MEDICAL RECORD NO.:  [id number]          PATIENT TYPE:  AMB   LOCATION:  DAY                           FACILITY:  [facility]   PHYSICIAN:  R. Roetta Sessions, M.D. DATE OF BIRTH:  July 20, 1945   DATE OF PROCEDURE:  04/27/2005  DATE OF DISCHARGE:                                 OPERATIVE REPORT   PROCEDURE:  Colonoscopy with biopsy, snare polypectomy.   INDICATIONS FOR PROCEDURE:  The patient is a 65 year old lady with no GI  symptoms referred for screening colonoscopy. She was referred by Dr.  Lodema Hong. No prior imaging of her colon. No family history of colorectal  cancer. Colonoscopy is now being done. This approach has been discussed with  the patient at length. Potential risks, benefits, and alternatives have been  reviewed and questions answered. She is agreeable. Please see documentation  in the medical record.   PROCEDURE NOTE:  O2 saturation, blood pressure, pulse, and respirations were  monitored throughout the entire procedure. Conscious sedation with Versed 4  mg IV and Demerol 75 mg IV in divided doses.   INSTRUMENT:  Olympus video chip system.   FINDINGS:  Digital rectal exam revealed no abnormalities.   ENDOSCOPIC FINDINGS:  Prep was adequate.   Rectum:  Examination of the rectal mucosa including retroflexed view of the  anal verge revealed no abnormalities.   Colon:  Colonic mucosa was surveyed from the rectosigmoid junction through  the left, transverse, and right colon to the area of the appendiceal  orifice, ileocecal valve, and cecum. These structures were well seen and  photographed for the record. From this level, the scope was slowly  withdrawn, and all previously mentioned mucosal surfaces were again seen.  The patient was noted to have a 3-mm polyp at the splenic flexure which was  cold biopsied/removed and a 5-mm pedunculated polyp in the descending colon  which was cold snared. In recovery, the  remainder of the colonic mucosa  appeared normal. The patient tolerated the procedure well and was reactive  to endoscopy.   IMPRESSION:  1.  Normal rectum.  2.  Diminutive polyp at the splenic flexure cold biopsied/removed. Mid      descending polyp removed with cold snare technique. The remainder of the      colonic mucosa appeared normal.   RECOMMENDATIONS:  No aspirin or arthritis medications for 10 days. Follow up  on pathology. Further recommendations to follow.      Jonathon Bellows, M.D.  Electronically Signed     RMR/MEDQ  D:  04/27/2005  T:  04/27/2005  Job:  [id number]   cc:   Milus Mallick. Lodema Hong, M.D.  Fax: [phone]

## 2011-09-07 ENCOUNTER — Other Ambulatory Visit (HOSPITAL_COMMUNITY): Payer: Self-pay | Admitting: Family Medicine

## 2011-09-07 DIAGNOSIS — Z139 Encounter for screening, unspecified: Secondary | ICD-10-CM

## 2011-09-14 ENCOUNTER — Other Ambulatory Visit (HOSPITAL_COMMUNITY): Payer: Self-pay | Admitting: *Deleted

## 2011-09-14 ENCOUNTER — Ambulatory Visit (HOSPITAL_COMMUNITY)
Admission: RE | Admit: 2011-09-14 | Discharge: 2011-09-14 | Disposition: A | Discharge status: Home / Self Care | Payer: Self-pay | Source: Ambulatory Visit | Attending: *Deleted | Admitting: *Deleted

## 2011-09-14 ENCOUNTER — Ambulatory Visit (HOSPITAL_COMMUNITY)
Admission: RE | Admit: 2011-09-14 | Discharge: 2011-09-14 | Disposition: A | Discharge status: Home / Self Care | Payer: Self-pay | Source: Ambulatory Visit | Attending: Family Medicine | Admitting: Family Medicine

## 2011-09-14 DIAGNOSIS — M25569 Pain in unspecified knee: Secondary | ICD-10-CM

## 2011-09-14 DIAGNOSIS — M25469 Effusion, unspecified knee: Secondary | ICD-10-CM | POA: Insufficient documentation

## 2011-09-14 DIAGNOSIS — Z139 Encounter for screening, unspecified: Secondary | ICD-10-CM

## 2011-09-14 IMAGING — CR DG KNEE 1-2V*R*
2 series · 2 of 2 positions shown · non-contrast
Comparison: None.

CLINICAL DATA: Pain.  No known injury.

RIGHT KNEE - 1-2 VIEW

[view not recorded (1 of 2)]
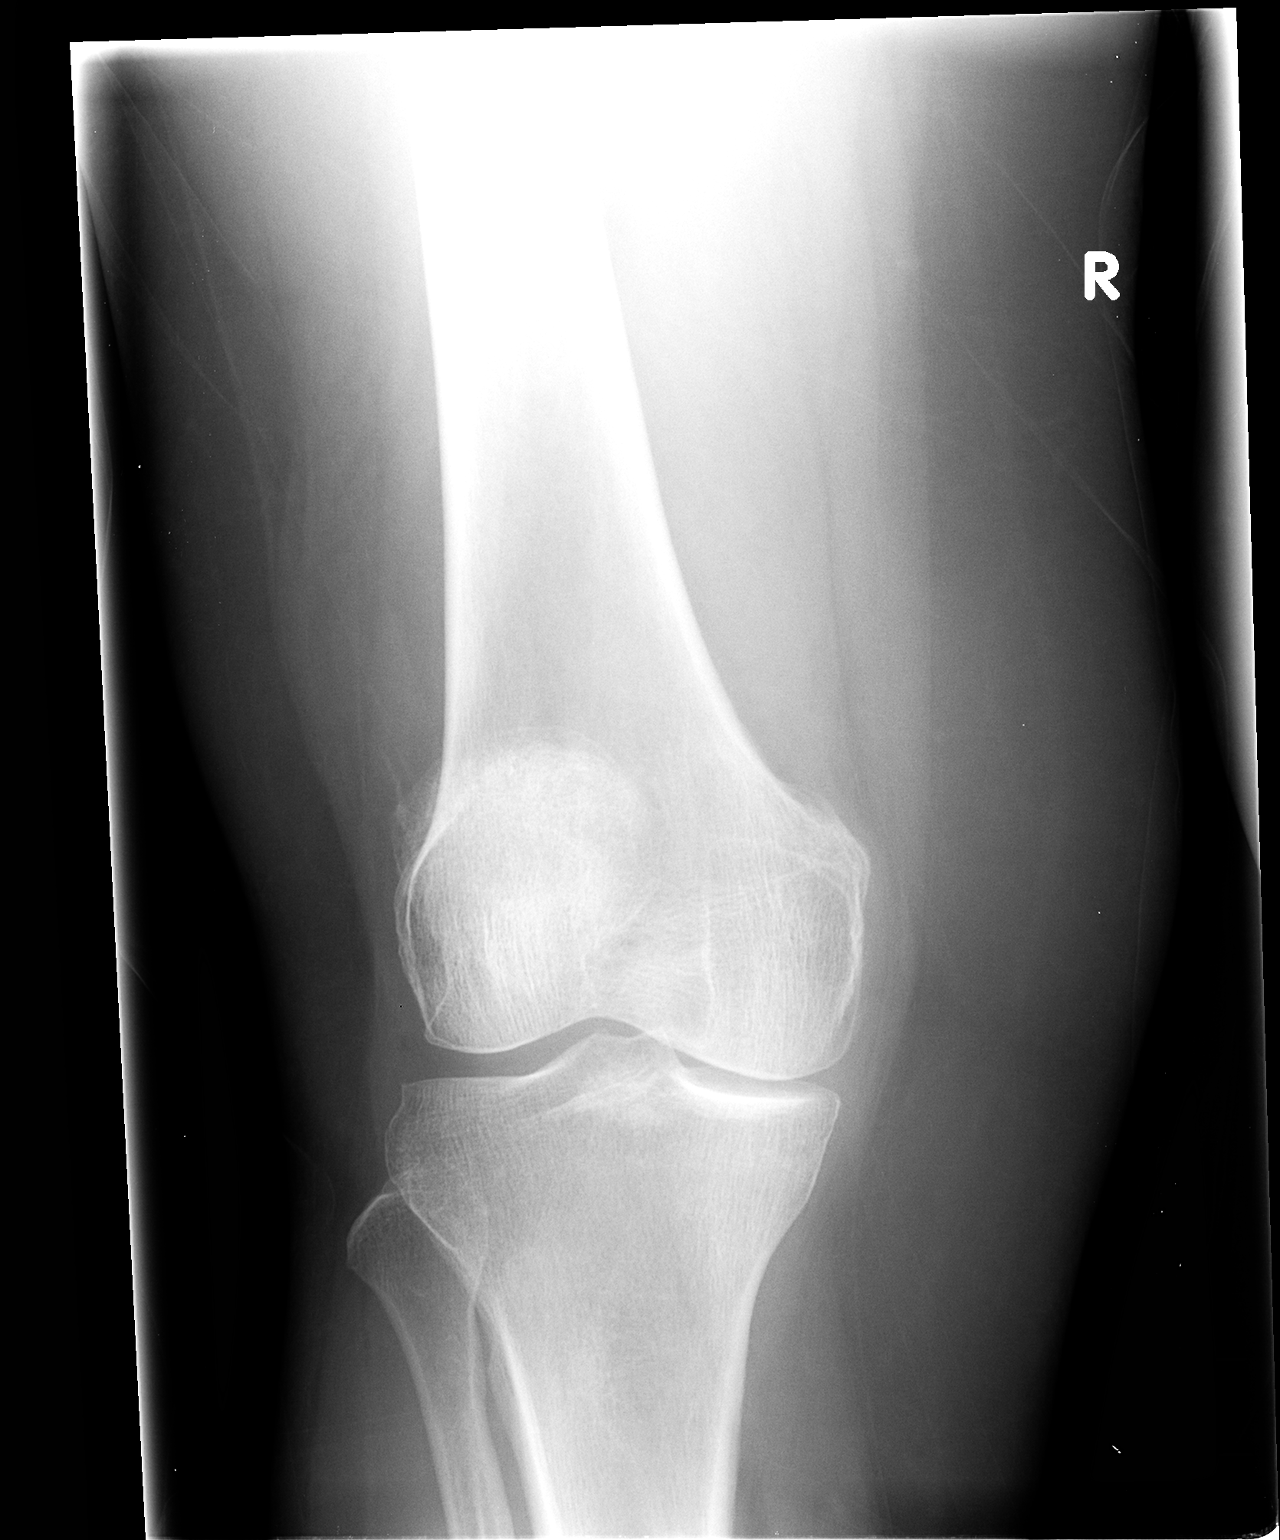

[view not recorded (2 of 2)]
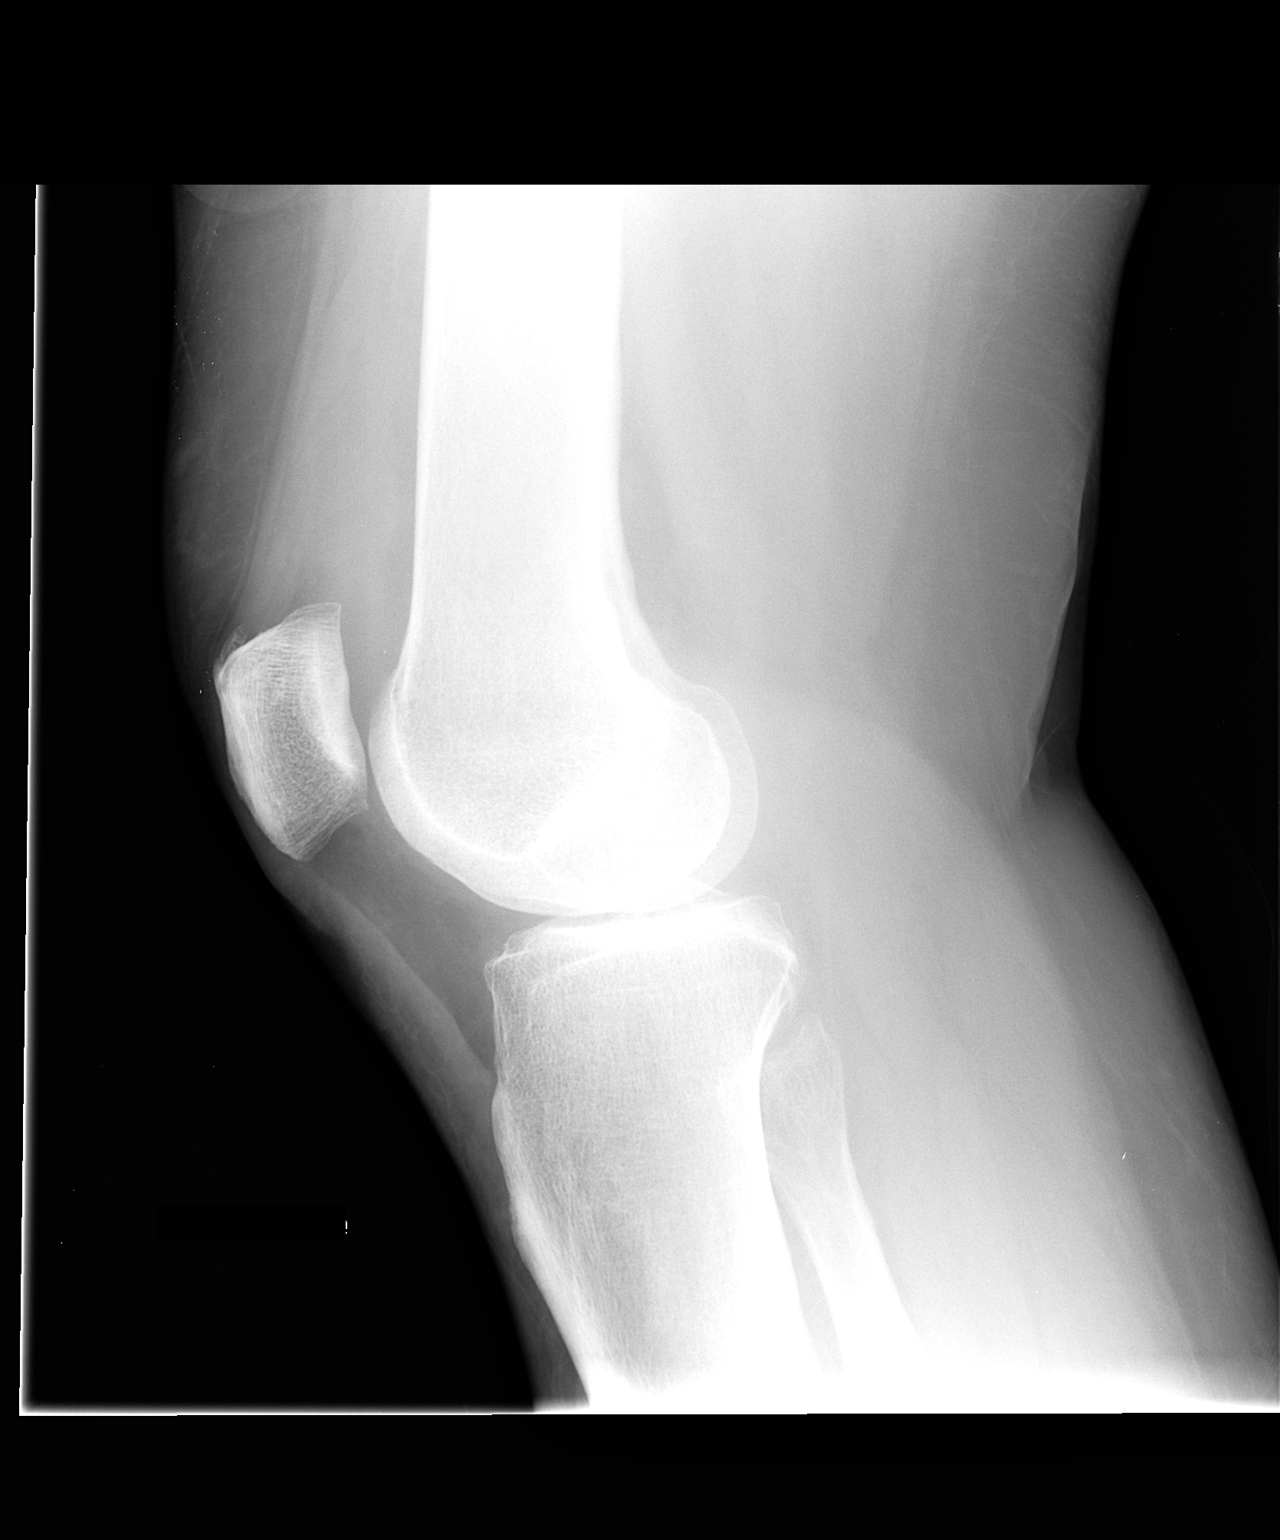

[2 of 2 positions shown; findings below may reference images not displayed]

FINDINGS: Medial tibiofemoral and patellofemoral joint degenerative
changes.  Small joint effusion.  No fracture or dislocation.
IMPRESSION: Medial tibiofemoral and patellofemoral joint degenerative changes.
Small joint effusion.

## 2011-09-15 ENCOUNTER — Telehealth: Payer: Self-pay

## 2011-09-15 NOTE — Telephone Encounter (Signed)
Wanting the results of her right knee xray

## 2011-09-15 NOTE — Telephone Encounter (Signed)
Patient aware to contact the health dept

## 2011-09-15 NOTE — Telephone Encounter (Signed)
Pt has not been seen here since 2010??, we did not order this.

## 2011-11-15 ENCOUNTER — Emergency Department (HOSPITAL_COMMUNITY)
Admission: EM | Admit: 2011-11-15 | Discharge: 2011-11-15 | Disposition: A | Discharge status: Home / Self Care | Payer: Self-pay | Attending: Emergency Medicine | Admitting: Emergency Medicine

## 2011-11-15 ENCOUNTER — Encounter (HOSPITAL_COMMUNITY): Payer: Self-pay | Admitting: Emergency Medicine

## 2011-11-15 DIAGNOSIS — Z880 Allergy status to penicillin: Secondary | ICD-10-CM | POA: Insufficient documentation

## 2011-11-15 DIAGNOSIS — I10 Essential (primary) hypertension: Secondary | ICD-10-CM | POA: Insufficient documentation

## 2011-11-15 DIAGNOSIS — M1710 Unilateral primary osteoarthritis, unspecified knee: Secondary | ICD-10-CM | POA: Insufficient documentation

## 2011-11-15 DIAGNOSIS — M25569 Pain in unspecified knee: Secondary | ICD-10-CM

## 2011-11-15 HISTORY — DX: Essential (primary) hypertension: I10

## 2011-11-15 MED ORDER — HYDROCODONE-ACETAMINOPHEN 5-325 MG PO TABS: 1.0000 | ORAL_TABLET | ORAL | Status: DC | PRN

## 2011-11-15 MED ORDER — HYDROCODONE-ACETAMINOPHEN 5-325 MG PO TABS
2.0000 | ORAL_TABLET | Freq: Once | ORAL | Status: AC
  Administered 2011-11-15: 2 via ORAL
  Filled 2011-11-15: qty 2

## 2011-11-15 NOTE — Discharge Instructions (Signed)
You have arthritis in the knee. Apply ice for swelling. Continue to use the ibuprofen. Use the stronger medicine in addition to the ibuprofen. Follow up with an orthopedist. One is listed for you on the paperwork.    Arthralgia Arthralgia is joint pain. A joint is a place where two bones meet. Joint pain can happen for many reasons. The joint can be bruised, stiff, infected, or weak from aging. Pain usually goes away after resting and taking medicine for soreness.  HOME CARE  Rest the joint as told by your doctor.   Keep the sore joint raised (elevated) for the first 24 hours.   Put ice on the joint area.   Put ice in a plastic bag.   Place a towel between your skin and the bag.   Leave the ice on for 15 to 20 minutes, 3 to 4 times a day.   Wear your splint, casting, elastic bandage, or sling as told by your doctor.   Only take medicine as told by your doctor. Do not take aspirin.   Use crutches as told by your doctor. Do not put weight on the joint until told to by your doctor.  GET HELP RIGHT AWAY IF:   You have bruising, puffiness (swelling), or more pain.   Your fingers or toes turn blue or start to lose feeling (numb).   Your medicine does not lessen the pain.   Your pain becomes severe.   You have a temperature by mouth above 102 F (38.9 C), not controlled by medicine.   You cannot move or use the joint.  MAKE SURE YOU:   Understand these instructions.   Will watch your condition.   Will get help right away if you are not doing well or get worse.  Document Released: 05/17/2009 Document Revised: 05/18/2011 Document Reviewed: 05/17/2009 [facility] Patient Information 2012 [place], [place].

## 2011-11-15 NOTE — ED Provider Notes (Signed)
History     CSN: [id number]  Arrival date & time 11/15/11  4098   First MD Initiated Contact with Patient 11/15/11 [phone]      Chief Complaint  Patient presents with  . Leg Pain    (Consider location/radiation/quality/duration/timing/severity/associated sxs/prior treatment) HPI Karen [name] is a 66 y.o. female with a h/o arthritis who presents to the Emergency Department complaining of persistent and worsening pain to her right knee. She has had increasing pain for over 3 months and is being followed at the health department. They have obtained an xray which shows arthritic changes and a mild effusion. Over the past 3-4 days she has been on her feet more and the pain has gotten worse. She is using ibuprofen 3 x day with no relief.  PCP Dr. Lodema Hong  Past Medical History  Diagnosis Date  . Hypertension     Past Surgical History  Procedure Date  . Ankle surgery   . Abdominal hysterectomy     History reviewed. No pertinent family history.  History  Substance Use Topics  . Smoking status: Never Smoker   . Smokeless tobacco: Not on file  . Alcohol Use: No    OB History    Grav Para Term Preterm Abortions TAB SAB Ect Mult Living                  Review of Systems  Constitutional: Negative for fever.       10 Systems reviewed and are negative for acute change except as noted in the HPI.  HENT: Negative for congestion.   Eyes: Negative for discharge and redness.  Respiratory: Negative for cough and shortness of breath.   Cardiovascular: Negative for chest pain.  Gastrointestinal: Negative for vomiting and abdominal pain.  Musculoskeletal: Negative for back pain.       Right knee pain  Skin: Negative for rash.  Neurological: Negative for syncope, numbness and headaches.  Psychiatric/Behavioral:       No behavior change.    Allergies  Penicillins  Home Medications   Current Outpatient Rx  Name Route Sig Dispense Refill  . LISINOPRIL-HYDROCHLOROTHIAZIDE 20-25  MG PO TABS Oral Take 1 tablet by mouth daily.      BP 155/79  Pulse 89  Temp(Src) 98.5 F (36.9 C) (Oral)  Resp 16  Ht 5\' 6"  (1.676 m)  Wt 200 lb (90.719 kg)  BMI 32.28 kg/m2  SpO2 97%  Physical Exam  Nursing note and vitals reviewed. Constitutional: She is oriented to person, place, and time.       Awake, alert, nontoxic appearance.  HENT:  Head: Atraumatic.  Eyes: Right eye exhibits no discharge. Left eye exhibits no discharge.  Neck: Neck supple.  Pulmonary/Chest: Effort normal. She exhibits no tenderness.  Abdominal: Soft. There is no tenderness. There is no rebound.  Musculoskeletal: She exhibits no tenderness.       Baseline ROM, no obvious new focal weakness.FROM to right knee with mild discomfort. NO crepitus. No marked effusion noted.   Neurological: She is alert and oriented to person, place, and time.       Mental status and motor strength appears baseline for patient and situation.  Skin: No rash noted.  Psychiatric: She has a normal mood and affect.    ED Course  Procedures (including critical care time)    MDM  Patient with persistent worsening knee pain c/w arthritis.Given analgesics. Referral to orthopedist.Pt stable in ED with no significant deterioration in condition.The patient appears reasonably screened  and/or stabilized for discharge and I doubt any other medical condition or other [facility] requiring further screening, evaluation, or treatment in the ED at this time prior to discharge.  MDM Reviewed: nursing note, vitals and previous chart Reviewed previous: x-ray           Nicoletta Dress. Colon Branch, MD 11/15/11 [phone]

## 2011-11-15 NOTE — ED Notes (Signed)
Patient complaining of right leg pain x 3 months, getting worse the last 3-4 days.

## 2011-11-22 ENCOUNTER — Encounter: Payer: Self-pay | Admitting: Orthopedic Surgery

## 2011-11-22 ENCOUNTER — Ambulatory Visit (INDEPENDENT_AMBULATORY_CARE_PROVIDER_SITE_OTHER): Payer: Self-pay | Admitting: Orthopedic Surgery

## 2011-11-22 VITALS — BP 130/70 | Ht 66.0 in | Wt 200.0 lb

## 2011-11-22 DIAGNOSIS — IMO0002 Reserved for concepts with insufficient information to code with codable children: Secondary | ICD-10-CM

## 2011-11-22 DIAGNOSIS — M76899 Other specified enthesopathies of unspecified lower limb, excluding foot: Secondary | ICD-10-CM

## 2011-11-22 DIAGNOSIS — M1710 Unilateral primary osteoarthritis, unspecified knee: Secondary | ICD-10-CM

## 2011-11-22 HISTORY — DX: Other specified enthesopathies of unspecified lower limb, excluding foot: M76.899

## 2011-11-22 MED ORDER — HYDROCODONE-ACETAMINOPHEN 5-325 MG PO TABS: 1.0000 | ORAL_TABLET | Freq: Four times a day (QID) | ORAL | Status: AC | PRN
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
## 2011-11-22 NOTE — Patient Instructions (Signed)
You have received a steroid shot. 15% of patients experience increased pain at the injection site with in the next 24 hours. This is best treated with ice and tylenol extra strength 2 tabs every 8 hours. If you are still having pain please call the office.   Exercises   Weight loss   Wear and Tear Disorders of the Knee (Arthritis, Osteoarthritis) Everyone will experience wear and tear injuries (arthritis, osteoarthritis) of the knee. These are the changes we all get as we age. They come from the joint stress of daily living. The amount of cartilage damage in your knee and your symptoms determine if you need surgery. Mild problems require approximately two months recovery time. More severe problems take several months to recover. With mild problems, your surgeon may find worn and rough cartilage surfaces. With severe changes, your surgeon may find cartilage that has completely worn away and exposed the bone. Loose bodies of bone and cartilage, bone spurs (excess bone growth), and injuries to the menisci (cushions between the large bones of your leg) are also common. All of these problems can cause pain. For a mild wear and tear problem, rough cartilage may simply need to be shaved and smoothed. For more severe problems with areas of exposed bone, your surgeon may use an instrument for roughing up the bone surfaces to stimulate new cartilage growth. Loose bodies are usually removed. Torn menisci may be trimmed or repaired. ABOUT THE ARTHROSCOPIC PROCEDURE Arthroscopy is a surgical technique. It allows your orthopedic surgeon to diagnose and treat your knee injury with accuracy. The surgeon looks into your knee through a small scope. The scope is like a small (pencil-sized) telescope. Arthroscopy is less invasive than open knee surgery. You can expect a more rapid recovery. After the procedure, you will be moved to a recovery area until most of the effects of the medication have worn off. Your caregiver will  discuss the test results with you. RECOVERY The severity of the arthritis and the type of procedure performed will determine recovery time. Other important factors include age, physical condition, medical conditions, and the type of rehabilitation program. Strengthening your muscles after arthroscopy helps guarantee a better recovery. Follow your caregiver's instructions. Use crutches, rest, elevate, ice, and do knee exercises as instructed. Your caregivers will help you and instruct you with exercises and other physical therapy required to regain your mobility, muscle strength, and functioning following surgery. Only take over-the-counter or prescription medicines for pain, discomfort, or fever as directed by your caregiver.   SEEK MEDICAL CARE IF:    There is increased bleeding (more than a small spot) from the wound.   You notice redness, swelling, or increasing pain in the wound.   Pus is coming from wound.   You develop an unexplained oral temperature above 102 F (38.9 C) , or as your caregiver suggests.   You notice a foul smell coming from the wound or dressing.   You have severe pain with motion of the knee.  SEEK IMMEDIATE MEDICAL CARE IF:    You develop a rash.   You have difficulty breathing.   You have any allergic problems.  MAKE SURE YOU:    Understand these instructions.   Will watch your condition.   Will get help right away if you are not doing well or get worse.  Document Released: 05/26/2000 Document Revised: 05/18/2011 Document Reviewed: 10/23/2007 [facility] Patient Information 2012 [place], [place].

## 2012-06-12 DIAGNOSIS — R7303 Prediabetes: Secondary | ICD-10-CM

## 2012-06-12 HISTORY — DX: Prediabetes: R73.03

## 2012-09-11 ENCOUNTER — Ambulatory Visit (INDEPENDENT_AMBULATORY_CARE_PROVIDER_SITE_OTHER): Payer: Medicare Other | Admitting: Family Medicine

## 2012-09-11 ENCOUNTER — Encounter: Payer: Self-pay | Admitting: Family Medicine

## 2012-09-11 VITALS — BP 132/74 | HR 76 | Resp 18 | Ht 66.5 in | Wt 222.1 lb

## 2012-09-11 DIAGNOSIS — E8881 Metabolic syndrome: Secondary | ICD-10-CM

## 2012-09-11 DIAGNOSIS — M1710 Unilateral primary osteoarthritis, unspecified knee: Secondary | ICD-10-CM

## 2012-09-11 DIAGNOSIS — Z131 Encounter for screening for diabetes mellitus: Secondary | ICD-10-CM

## 2012-09-11 DIAGNOSIS — I10 Essential (primary) hypertension: Secondary | ICD-10-CM

## 2012-09-11 DIAGNOSIS — E669 Obesity, unspecified: Secondary | ICD-10-CM

## 2012-09-11 DIAGNOSIS — E785 Hyperlipidemia, unspecified: Secondary | ICD-10-CM

## 2012-09-11 DIAGNOSIS — R7309 Other abnormal glucose: Secondary | ICD-10-CM

## 2012-09-11 DIAGNOSIS — R7303 Prediabetes: Secondary | ICD-10-CM

## 2012-09-11 LAB — CBC
HCT: 37.5 % (ref 36.0–46.0)
MCH: 28.7 pg (ref 26.0–34.0)
MCV: 83.5 fL (ref 78.0–100.0)
Platelets: 416 10*3/uL — ABNORMAL HIGH (ref 150–400)
RBC: 4.49 MIL/uL (ref 3.87–5.11)
RDW: 13.2 % (ref 11.5–15.5)

## 2012-09-11 LAB — BASIC METABOLIC PANEL
CO2: 29 mEq/L (ref 19–32)
Calcium: 9.7 mg/dL (ref 8.4–10.5)
Chloride: 102 mEq/L (ref 96–112)
Creat: 0.73 mg/dL (ref 0.50–1.10)

## 2012-09-11 LAB — LIPID PANEL
Cholesterol: 212 mg/dL — ABNORMAL HIGH (ref 0–200)
HDL: 44 mg/dL (ref >39)
Total CHOL/HDL Ratio: 4.8 Ratio

## 2012-09-11 LAB — HEMOGLOBIN A1C
Hgb A1c MFr Bld: 6.1 % — ABNORMAL HIGH (ref <5.7)
Mean Plasma Glucose: 128 mg/dL — ABNORMAL HIGH (ref <117)

## 2012-09-11 LAB — TSH: TSH: 1.055 u[IU]/mL (ref 0.350–4.500)

## 2012-09-11 MED ORDER — PREDNISONE 5 MG PO TABS: 5.0000 mg | ORAL_TABLET | Freq: Two times a day (BID) | ORAL | Status: AC

## 2012-09-11 MED ORDER — LISINOPRIL-HYDROCHLOROTHIAZIDE 20-25 MG PO TABS: 1.0000 | ORAL_TABLET | Freq: Every day | ORAL | Status: DC

## 2012-09-11 NOTE — Patient Instructions (Addendum)
F/u inn 5 month and 3 weeks   It is important that you exercise regularly at least 30 minutes 5 times a week. If you develop chest pain, have severe difficulty breathing, or feel very tired, stop exercising immediately and seek medical attention    A healthy diet is rich in fruit, vegetables and whole grains. Poultry fish, nuts and beans are a healthy choice for protein rather then red meat. A low sodium diet and drinking 64 ounces of water daily is generally recommended. Oils and sweet should be limited. Carbohydrates especially for those who are diabetic or overweight, should be limited to 30-45 gram per meal. It is important to eat on a regular schedule, at least 3 times daily. Snacks should be primarily fruits, vegetables or nuts.  Fasting lipid, chem 7, HBA1C, TSh, CBC today   Prednisone twice daily for 5 days, and ibuprofen twice daily for 5 days for knee pain

## 2012-09-14 ENCOUNTER — Telehealth: Payer: Self-pay | Admitting: Family Medicine

## 2012-09-14 ENCOUNTER — Encounter: Payer: Self-pay | Admitting: Family Medicine

## 2012-09-14 DIAGNOSIS — E8881 Metabolic syndrome: Secondary | ICD-10-CM | POA: Insufficient documentation

## 2012-09-14 DIAGNOSIS — R7302 Impaired glucose tolerance (oral): Secondary | ICD-10-CM | POA: Insufficient documentation

## 2012-09-14 DIAGNOSIS — I10 Essential (primary) hypertension: Secondary | ICD-10-CM

## 2012-09-14 DIAGNOSIS — E785 Hyperlipidemia, unspecified: Secondary | ICD-10-CM

## 2012-09-14 NOTE — Progress Notes (Signed)
  Subjective:    Patient ID: Karen [name], female    DOB: 1945/09/23, 67 y.o.   MRN: [id number]  HPI The PT is here for follow up and re-evaluation of chronic medical conditions, medication management and review of any available recent lab and radiology data.  Preventive health is updated, specifically  Cancer screening and Immunization.  Needs pneumonia vaccine, same will be administered at next visit Questions or concerns regarding consultations or procedures which the PT has had in the interim are  Addressed.Had to see orthopedics re severe arthritis of the knee. Has been followed at the health dept pending insurance coverage The PT denies any adverse reactions to current medications since the last visit.  There are no new concerns.  There are no specific complaints       Review of Systems See HPI Denies recent fever or chills. Denies sinus pressure, nasal congestion, ear pain or sore throat. Denies chest congestion, productive cough or wheezing. Denies chest pains, palpitations and leg swelling Denies abdominal pain, nausea, vomiting,diarrhea or constipation.   Denies dysuria, frequency, hesitancy or incontinence. C/o knee pain and swelling and limitation in mobility. Denies headaches, seizures, numbness, or tingling. Denies depression, anxiety or insomnia. Denies skin break down or rash.         Objective:   Physical Exam  Patient alert and oriented and in no cardiopulmonary distress.  HEENT: No facial asymmetry, EOMI, no sinus tenderness,  oropharynx pink and moist.  Neck supple no adenopathy.  Chest: Clear to auscultation bilaterally.  CVS: S1, S2 no murmurs, no S3.  ABD: Soft non tender. Bowel sounds normal.  Ext: No edema  MS: Adequate ROM spine, shoulders, hips and knees.  Skin: Intact, no ulcerations or rash noted.  Psych: Good eye contact, normal affect. Memory intact not anxious or depressed appearing.  CNS: CN 2-12 intact, power, tone and sensation  normal throughout.       Assessment & Plan:

## 2012-09-14 NOTE — Assessment & Plan Note (Signed)
New diagnosis. Pt to be contacted after the visit when lab is available for education re the seriousness of the dx and need to work on lifestyle change and weight lsss, she will also be referred to a class and literature provided Rept in 4 monht

## 2012-09-14 NOTE — Assessment & Plan Note (Signed)
Controlled, no change in medication DASH diet and commitment to daily physical activity for a minimum of 30 minutes discussed and encouraged, as a part of hypertension management. The importance of attaining a healthy weight is also discussed.  

## 2012-09-14 NOTE — Assessment & Plan Note (Signed)
Elevated LDL in pt with increased CV risk due to prediabetic status.Has resisted statins in the past, will re introduce the idea again based on overall picture Hyperlipidemia:Low fat diet discussed and encouraged.

## 2012-09-14 NOTE — Assessment & Plan Note (Signed)
Increased risk of cAd without lifestyle change, weight loss and improvement in labs , this will be discussed at length at next visit

## 2012-09-14 NOTE — Assessment & Plan Note (Signed)
Uncontrolled right knee pain and swelling , trial of oral meds, if no relief , she will call for referral to ortho

## 2012-09-14 NOTE — Assessment & Plan Note (Signed)
Deteriorated. Patient re-educated about  the importance of commitment to a  minimum of 150 minutes of exercise per week. The importance of healthy food choices with portion control discussed. Encouraged to start a food diary, count calories and to consider  joining a support group. Sample diet sheets offered. Goals set by the patient for the next several months.    

## 2012-09-14 NOTE — Telephone Encounter (Signed)
Luann, please change f/u to 4 month, do as the welcome to medicare visit, based on her labs she needs to come in sooner than i had previously expected.  Brandi, please order HBA1C, chem 7 for f/u visit, she NEEDs to draw blood 1 week before the visit, let her know and sh needs to collect sheet or you fax it in, thanks

## 2012-09-16 NOTE — Telephone Encounter (Signed)
Lab order mailed with instructions to do 1 week before visit

## 2012-09-16 NOTE — Telephone Encounter (Signed)
Needs labs done 1 week before visit

## 2012-09-16 NOTE — Addendum Note (Signed)
Addended by: Abner Greenspan on: 09/16/2012 08:16 AM   Modules accepted: Orders

## 2013-02-13 ENCOUNTER — Other Ambulatory Visit: Payer: Self-pay | Admitting: Family Medicine

## 2013-02-13 LAB — BASIC METABOLIC PANEL
BUN: 16 mg/dL (ref 6–23)
CO2: 27 mEq/L (ref 19–32)
Calcium: 9.7 mg/dL (ref 8.4–10.5)
Creat: 0.83 mg/dL (ref 0.50–1.10)
Glucose, Bld: 97 mg/dL (ref 70–99)
Sodium: 136 mEq/L (ref 135–145)

## 2013-02-14 ENCOUNTER — Other Ambulatory Visit: Payer: Self-pay | Admitting: Family Medicine

## 2013-02-14 DIAGNOSIS — Z139 Encounter for screening, unspecified: Secondary | ICD-10-CM

## 2013-02-17 ENCOUNTER — Ambulatory Visit (HOSPITAL_COMMUNITY)
Admission: RE | Admit: 2013-02-17 | Discharge: 2013-02-17 | Disposition: A | Discharge status: Home / Self Care | Payer: Medicare Other | Source: Ambulatory Visit | Attending: Family Medicine | Admitting: Family Medicine

## 2013-02-17 DIAGNOSIS — Z1231 Encounter for screening mammogram for malignant neoplasm of breast: Secondary | ICD-10-CM | POA: Insufficient documentation

## 2013-02-17 DIAGNOSIS — Z139 Encounter for screening, unspecified: Secondary | ICD-10-CM

## 2013-02-20 ENCOUNTER — Encounter: Payer: Self-pay | Admitting: Family Medicine

## 2013-02-20 ENCOUNTER — Ambulatory Visit (INDEPENDENT_AMBULATORY_CARE_PROVIDER_SITE_OTHER): Payer: Medicare Other | Admitting: Family Medicine

## 2013-02-20 ENCOUNTER — Telehealth: Payer: Self-pay | Admitting: Family Medicine

## 2013-02-20 VITALS — BP 130/72 | HR 79 | Resp 16 | Ht 66.0 in | Wt 217.0 lb

## 2013-02-20 DIAGNOSIS — Z23 Encounter for immunization: Secondary | ICD-10-CM

## 2013-02-20 DIAGNOSIS — E785 Hyperlipidemia, unspecified: Secondary | ICD-10-CM

## 2013-02-20 DIAGNOSIS — E8881 Metabolic syndrome: Secondary | ICD-10-CM

## 2013-02-20 DIAGNOSIS — I10 Essential (primary) hypertension: Secondary | ICD-10-CM

## 2013-02-20 DIAGNOSIS — Z0000 Encounter for general adult medical examination without abnormal findings: Secondary | ICD-10-CM

## 2013-02-20 MED ORDER — LISINOPRIL-HYDROCHLOROTHIAZIDE 20-25 MG PO TABS: 1.0000 | ORAL_TABLET | Freq: Every day | ORAL | Status: DC

## 2013-02-20 NOTE — Patient Instructions (Addendum)
F/u in 4 month, call if you need me before  CONGRATS on 4 pound weight loss and improved blood sugar  Fasting lipid, chem 7 and HBA1C in 4 month  It is important that you exercise regularly at least 30 minutes 5 times a week. If you develop chest pain, have severe difficulty breathing, or feel very tired, stop exercising immediately and seek medical attention   We will give you info on carb counting and explain to improve your blood sugars. you will be referred for colonoscopy once record is obtained, if due  Pneumonia and flu vaccines today  Info on shingles vaccine, you need this, pls check with your pharmacy  Cholesterol is high, pls reduce fried and fatty foods

## 2013-02-20 NOTE — Telephone Encounter (Signed)
rx sent in 
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
## 2013-03-03 DIAGNOSIS — Z0000 Encounter for general adult medical examination without abnormal findings: Secondary | ICD-10-CM | POA: Insufficient documentation

## 2013-03-03 NOTE — Assessment & Plan Note (Signed)
Welcome to medicare exam as documented. Pt needs to work on reduced carb diet and regular physical activity to improve her health and prevent diabetes. She is motivated. Routine health maintenance issues need to be updated also

## 2013-03-04 ENCOUNTER — Telehealth: Payer: Self-pay | Admitting: Family Medicine

## 2013-03-05 ENCOUNTER — Encounter: Payer: Self-pay | Admitting: Internal Medicine

## 2013-03-05 ENCOUNTER — Telehealth: Payer: Self-pay | Admitting: Family Medicine

## 2013-03-05 ENCOUNTER — Telehealth: Payer: Self-pay | Admitting: Internal Medicine

## 2013-03-05 DIAGNOSIS — Z86010 Personal history of colonic polyps: Secondary | ICD-10-CM

## 2013-03-05 DIAGNOSIS — Z1211 Encounter for screening for malignant neoplasm of colon: Secondary | ICD-10-CM

## 2013-03-05 NOTE — Telephone Encounter (Signed)
Pt is aware of OV on 10/14 at 0900 with [facility] and appt card was mailed

## 2013-03-05 NOTE — Telephone Encounter (Signed)
Message copied by Ferne Reus on Wed Mar 05, 2013  8:46 AM ------      Message from: Corbin Ade      Created: Wed Mar 05, 2013  8:13 AM      Regarding: RE: colonoscopy check       Sounds good. We will be looking to get her scheduled      ----- Message -----         From: Kerri Perches, MD         Sent: 03/04/2013   5:52 PM           To: Corbin Ade, MD, Kerri Perches, MD      Subject: colonoscopy check                                        Karen [name],      Our mutual pt had 2 adenomatous polyps removed by you 04/27/2005      I plan on referring re for re evaluation, just let me know if oK            Thanks      Karen [name]        ------

## 2013-03-05 NOTE — Telephone Encounter (Signed)
Please let pt know that she need a colonoscopy, I have discussed with Dr Jena Gauss and she is beiong referred back to him, thanks

## 2013-03-05 NOTE — Telephone Encounter (Signed)
Message copied by Ferne Reus on Wed Mar 05, 2013  8:45 AM ------      Message from: Corbin Ade      Created: Wed Mar 05, 2013  8:13 AM      Regarding: RE: colonoscopy check       Sounds good. We will be looking to get her scheduled      ----- Message -----         From: Kerri Perches, MD         Sent: 03/04/2013   5:52 PM           To: Corbin Ade, MD, Kerri Perches, MD      Subject: colonoscopy check                                        Alba Destine,      Our mutual pt had 2 adenomatous polyps removed by you 04/27/2005      I plan on referring re for re evaluation, just let me know if oK            Thanks      Claris Che        ------

## 2013-03-25 ENCOUNTER — Ambulatory Visit: Payer: Medicare Other | Admitting: Gastroenterology

## 2013-04-17 ENCOUNTER — Ambulatory Visit: Payer: Medicare Other | Admitting: Gastroenterology

## 2013-05-01 ENCOUNTER — Ambulatory Visit (INDEPENDENT_AMBULATORY_CARE_PROVIDER_SITE_OTHER): Payer: Medicare Other | Admitting: Gastroenterology

## 2013-05-01 ENCOUNTER — Encounter (INDEPENDENT_AMBULATORY_CARE_PROVIDER_SITE_OTHER): Payer: Self-pay

## 2013-05-01 ENCOUNTER — Encounter: Payer: Self-pay | Admitting: Gastroenterology

## 2013-05-01 VITALS — BP 126/73 | HR 73 | Temp 98.7°F | Ht 66.5 in | Wt 205.0 lb

## 2013-05-01 DIAGNOSIS — Z860101 Personal history of adenomatous and serrated colon polyps: Secondary | ICD-10-CM

## 2013-05-01 DIAGNOSIS — Z86010 Personal history of colonic polyps: Secondary | ICD-10-CM | POA: Insufficient documentation

## 2013-05-01 MED ORDER — PEG-KCL-NACL-NASULF-NA ASC-C 100 G PO SOLR: 1.0000 | ORAL | Status: DC

## 2013-05-01 NOTE — Patient Instructions (Signed)
1. We have scheduled you for a colonoscopy with Dr. Jena Gauss. Please see separate instructions.

## 2013-05-01 NOTE — Progress Notes (Signed)
[facility] PCP 

## 2013-05-01 NOTE — Addendum Note (Signed)
Addended by: Jennings Books on: 05/01/2013 11:34 AM   Modules accepted: Orders

## 2013-05-01 NOTE — Assessment & Plan Note (Signed)
Due for surveillance colonoscopy at this time.  I have discussed the risks, alternatives, benefits with regards to but not limited to the risk of reaction to medication, bleeding, infection, perforation and the patient is agreeable to proceed. Written consent to be obtained.  

## 2013-05-01 NOTE — Progress Notes (Signed)
Primary Care Physician:  Syliva Overman, MD  Primary Gastroenterologist:  Roetta Sessions, MD   Chief Complaint  Patient presents with  . Colonoscopy    past time for next one/ no problems    HPI:  Karen [name] is a 67 y.o. female here to schedule surveillance colonoscopy. H/o adenomatous polyps in 2006. She has chronic constipation controlled with fiber. 16 pounds weight loss since 09/2012 intentionally. Denies abdominal pain, diarrhea, melena, brbpr, heartburn, dysphagia, vomiting, anorexia.  Current Outpatient Prescriptions  Medication Sig Dispense Refill  . aspirin 81 MG tablet Take 81 mg by mouth daily.      . Cholecalciferol (D3-1000) 1000 UNITS tablet Take 1,000 Units by mouth daily.      . Coenzyme Q10 (CO Q 10 PO) Take by mouth.      . Ginger, Zingiber officinalis, (GINGER ROOT) 550 MG CAPS Take by mouth.      . Glucosamine-Chondroitin (OSTEO BI-FLEX REGULAR STRENGTH) 250-200 MG TABS Take 1 tablet by mouth daily.      Marland Kitchen ibuprofen (ADVIL,MOTRIN) 200 MG tablet Take 200 mg by mouth as needed for pain.      Marland Kitchen lisinopril-hydrochlorothiazide (PRINZIDE,ZESTORETIC) 20-25 MG per tablet Take 1 tablet by mouth daily.  90 tablet  1  . Misc Natural Products (OSTEO BI-FLEX ADV DOUBLE ST PO) Take by mouth.      . Omega-3 Fatty Acids (FISH OIL) 1200 MG CAPS Take by mouth.       No current facility-administered medications for this visit.    Allergies as of 05/01/2013 - Review Complete 05/01/2013  Allergen Reaction Noted  . Penicillins  03/22/2010    Past Medical History  Diagnosis Date  . Hypertension   . Hyperlipidemia   . Arthritis   . Prediabetes 2014    lifestyle     Past Surgical History  Procedure Laterality Date  . Ankle surgery    . Abdominal hysterectomy  1983 approx    fibroids, partial  . Fracture surgery Right mid 1990's    pin put in and waas removed  . Colonoscopy  04/27/2005    [id number] polyp at the splenic flexure cold biopsied/removed. Mid   descending polyp removed with cold snare technique. The remainder of the of the colon looked normal/ Normal rectum. inflammed adenomatous polyps.    Family History  Problem Relation Age of Onset  . Arthritis    . Diabetes      History   Social History  . Marital Status: Married    Spouse Name: N/A    Number of Children: N/A  . Years of Education: 12   Occupational History  . Not on file.   Social History Main Topics  . Smoking status: Never Smoker   . Smokeless tobacco: Not on file  . Alcohol Use: No  . Drug Use: No  . Sexual Activity:    Other Topics Concern  . Not on file   Social History Narrative  . No narrative on file      ROS:  General: Negative for anorexia, weight loss, fever, chills, fatigue, weakness. Eyes: Negative for vision changes.  ENT: Negative for hoarseness, difficulty swallowing , nasal congestion. CV: Negative for chest pain, angina, palpitations, dyspnea on exertion, peripheral edema.  Respiratory: Negative for dyspnea at rest, dyspnea on exertion, cough, sputum, wheezing.  GI: See history of present illness. GU:  Negative for dysuria, hematuria, urinary incontinence, urinary frequency, nocturnal urination.  MS: Negative for joint pain, low back pain.  Derm: Negative for  rash or itching.  Neuro: Negative for weakness, abnormal sensation, seizure, frequent headaches, memory loss, confusion.  Psych: Negative for anxiety, depression, suicidal ideation, hallucinations.  Endo: Negative for unusual weight change.  Heme: Negative for bruising or bleeding. Allergy: Negative for rash or hives.    Physical Examination:  BP 126/73  Pulse 73  Temp(Src) 98.7 F (37.1 C) (Oral)  Ht 5' 6.5" (1.689 m)  Wt 205 lb (92.987 kg)  BMI 32.60 kg/m2   General: Well-nourished, well-developed in no acute distress.  Head: Normocephalic, atraumatic.   Eyes: Conjunctiva pink, no icterus. Mouth: Oropharyngeal mucosa moist and pink , no lesions erythema or  exudate. Neck: Supple without thyromegaly, masses, or lymphadenopathy.  Lungs: Clear to auscultation bilaterally.  Heart: Regular rate and rhythm, no murmurs rubs or gallops.  Abdomen: Bowel sounds are normal, nontender, nondistended, no hepatosplenomegaly or masses, no abdominal bruits or    hernia , no rebound or guarding.   Rectal: deferred Extremities: No lower extremity edema. No clubbing or deformities.  Neuro: Alert and oriented x 4 , grossly normal neurologically.  Skin: Warm and dry, no rash or jaundice.   Psych: Alert and cooperative, normal mood and affect.  Labs: Lab Results  Component Value Date   WBC 5.1 09/11/2012   HGB 12.9 09/11/2012   HCT 37.5 09/11/2012   MCV 83.5 09/11/2012   PLT 416* 09/11/2012     Imaging Studies: No results found.

## 2013-05-09 ENCOUNTER — Telehealth: Payer: Self-pay | Admitting: Family Medicine

## 2013-05-09 NOTE — Telephone Encounter (Signed)
Call was made earlier this year for pt to receive pneumonia vaccine

## 2013-05-14 ENCOUNTER — Encounter (HOSPITAL_COMMUNITY): Payer: Self-pay | Admitting: Pharmacy Technician

## 2013-05-25 ENCOUNTER — Emergency Department (HOSPITAL_COMMUNITY)
Admission: EM | Admit: 2013-05-25 | Discharge: 2013-05-25 | Disposition: A | Discharge status: Home / Self Care | Payer: Medicare Other | Attending: Emergency Medicine | Admitting: Emergency Medicine

## 2013-05-25 ENCOUNTER — Encounter (HOSPITAL_COMMUNITY): Payer: Self-pay | Admitting: Emergency Medicine

## 2013-05-25 DIAGNOSIS — K047 Periapical abscess without sinus: Secondary | ICD-10-CM | POA: Insufficient documentation

## 2013-05-25 DIAGNOSIS — Z862 Personal history of diseases of the blood and blood-forming organs and certain disorders involving the immune mechanism: Secondary | ICD-10-CM | POA: Insufficient documentation

## 2013-05-25 DIAGNOSIS — L03211 Cellulitis of face: Secondary | ICD-10-CM | POA: Insufficient documentation

## 2013-05-25 DIAGNOSIS — M129 Arthropathy, unspecified: Secondary | ICD-10-CM | POA: Insufficient documentation

## 2013-05-25 DIAGNOSIS — Z7982 Long term (current) use of aspirin: Secondary | ICD-10-CM | POA: Insufficient documentation

## 2013-05-25 DIAGNOSIS — I10 Essential (primary) hypertension: Secondary | ICD-10-CM | POA: Insufficient documentation

## 2013-05-25 DIAGNOSIS — Z79899 Other long term (current) drug therapy: Secondary | ICD-10-CM | POA: Insufficient documentation

## 2013-05-25 DIAGNOSIS — Z8639 Personal history of other endocrine, nutritional and metabolic disease: Secondary | ICD-10-CM | POA: Insufficient documentation

## 2013-05-25 DIAGNOSIS — Z880 Allergy status to penicillin: Secondary | ICD-10-CM | POA: Insufficient documentation

## 2013-05-25 DIAGNOSIS — L0201 Cutaneous abscess of face: Secondary | ICD-10-CM | POA: Insufficient documentation

## 2013-05-25 MED ORDER — CLINDAMYCIN HCL 150 MG PO CAPS: 450.0000 mg | ORAL_CAPSULE | Freq: Three times a day (TID) | ORAL | Status: AC

## 2013-05-25 MED ORDER — IBUPROFEN 400 MG PO TABS: 400.0000 mg | ORAL_TABLET | Freq: Four times a day (QID) | ORAL | Status: DC | PRN

## 2013-05-25 NOTE — ED Provider Notes (Signed)
CSN: [id number]     Arrival date & time 05/25/13  7829 History  This chart was scribed for Shanna Cisco, MD by Quintella Reichert, ED scribe.  This patient was seen in room APA15/APA15 and the patient's care was started at 7:54 AM.   Chief Complaint  Patient presents with  . Dental Pain    Patient is a 67 y.o. female presenting with tooth pain. The history is provided by the patient. No language interpreter was used.  Dental Pain Toothache location: left lower. Quality:  Throbbing Severity:  Moderate Onset quality:  Gradual Duration:  3 days Timing:  Constant Progression:  Worsening Chronicity:  Recurrent Context comment:  "infected gum" Relieved by:  NSAIDs Associated symptoms: facial swelling   Associated symptoms: no congestion, no fever, no headaches and no neck pain     HPI Comments: Karen [name] is a 67 y.o. female who presents to the Emergency Department complaining of 3 days of constant, worsening, throbbing left lower dental pain.  Pt states she has "infected gum" in that area and she has had recurrent pain there for some time but this is the most severe it has been.  Pain is associated with facial swelling to that area which began 2 days ago.  She has attempted to treat pain with ibuprofen with some relief.  She denies drainage, fevers, trouble swallowing or opening mouth, nausea, vomiting, diarrhea or any other associated symptoms.  Pt does have a dentist.  Tetanus is UTD.   Past Medical History  Diagnosis Date  . Hypertension   . Hyperlipidemia   . Arthritis   . Prediabetes 2014    lifestyle     Past Surgical History  Procedure Laterality Date  . Ankle surgery    . Abdominal hysterectomy  1983 approx    fibroids, partial  . Fracture surgery Right mid 1990's    pin put in and waas removed  . Colonoscopy  04/27/2005    [id number] polyp at the splenic flexure cold biopsied/removed. Mid  descending polyp removed with cold snare technique. The remainder  of the of the colon looked normal/ Normal rectum. inflammed adenomatous polyps.    Family History  Problem Relation Age of Onset  . Arthritis    . Diabetes      History  Substance Use Topics  . Smoking status: Never Smoker   . Smokeless tobacco: Never Used  . Alcohol Use: No    OB History   Grav Para Term Preterm Abortions TAB SAB Ect Mult Living   4 3 3  1  1   3       Review of Systems  Constitutional: Negative for fever, chills, diaphoresis, activity change, appetite change and fatigue.  HENT: Positive for dental problem and facial swelling. Negative for congestion, rhinorrhea, sore throat and trouble swallowing.   Eyes: Negative for photophobia and discharge.  Respiratory: Negative for cough, chest tightness and shortness of breath.   Cardiovascular: Negative for chest pain, palpitations and leg swelling.  Gastrointestinal: Negative for nausea, vomiting, abdominal pain and diarrhea.  Endocrine: Negative for polydipsia and polyuria.  Genitourinary: Negative for dysuria, frequency, difficulty urinating and pelvic pain.  Musculoskeletal: Negative for arthralgias, back pain, neck pain and neck stiffness.  Skin: Negative for color change and wound.  Allergic/Immunologic: Negative for immunocompromised state.  Neurological: Negative for facial asymmetry, weakness, numbness and headaches.  Hematological: Does not bruise/bleed easily.  Psychiatric/Behavioral: Negative for confusion and agitation.     Allergies  Penicillins  Home  Medications   Current Outpatient Rx  Name  Route  Sig  Dispense  Refill  . aspirin 81 MG tablet   Oral   Take 81 mg by mouth daily.         . Cholecalciferol (D3-1000) 1000 UNITS tablet   Oral   Take 1,000 Units by mouth daily.         . clindamycin (CLEOCIN) 150 MG capsule   Oral   Take 3 capsules (450 mg total) by mouth 3 (three) times daily. For 10 days   90 capsule   0   . Coenzyme Q10 (CO Q 10 PO)   Oral   Take 1 tablet by  mouth daily.          . Ginger, Zingiber officinalis, (GINGER ROOT) 550 MG CAPS   Oral   Take 1 capsule by mouth daily.          . Glucosamine-Chondroitin (OSTEO BI-FLEX REGULAR STRENGTH) 250-200 MG TABS   Oral   Take 1 tablet by mouth daily.         Marland Kitchen ibuprofen (ADVIL,MOTRIN) 200 MG tablet   Oral   Take 200 mg by mouth as needed for pain.         Marland Kitchen ibuprofen (ADVIL,MOTRIN) 400 MG tablet   Oral   Take 1 tablet (400 mg total) by mouth every 6 (six) hours as needed.   30 tablet   0   . lisinopril-hydrochlorothiazide (PRINZIDE,ZESTORETIC) 20-25 MG per tablet   Oral   Take 1 tablet by mouth daily.   90 tablet   1   . Omega-3 Fatty Acids (FISH OIL) 1200 MG CAPS   Oral   Take 1 capsule by mouth daily.          . peg 3350 powder (MOVIPREP) 100 G SOLR   Oral   Take 1 kit (200 g total) by mouth as directed.   1 kit   0    BP 135/65  Pulse 82  Temp(Src) 98.3 F (36.8 C)  Resp 18  Ht 5' 6.5" (1.689 m)  Wt 199 lb (90.266 kg)  BMI 31.64 kg/m2  SpO2 98%  Physical Exam  Nursing note and vitals reviewed. Constitutional: She is oriented to person, place, and time. She appears well-developed and well-nourished. No distress.  HENT:  Head: Normocephalic.  Mouth/Throat: Oropharynx is clear and moist. No oropharyngeal exudate, posterior oropharyngeal edema or posterior oropharyngeal erythema.  Swelling to left lower face. Abscess along gumline of 2nd molar on lower left.  Eyes: Pupils are equal, round, and reactive to light.  Neck: Neck supple.  Cardiovascular: Normal rate, regular rhythm and normal heart sounds.   No murmur heard. Pulmonary/Chest: Effort normal and breath sounds normal. No respiratory distress. She has no wheezes. She has no rales.  Abdominal: Soft. She exhibits no distension. There is no tenderness. There is no rebound and no guarding.  Musculoskeletal: She exhibits no edema and no tenderness.  Neurological: She is alert and oriented to person,  place, and time.  Skin: Skin is warm and dry.  Psychiatric: She has a normal mood and affect.    ED Course  Procedures (including critical care time)  DIAGNOSTIC STUDIES: Oxygen Saturation is 98% on room air, normal by my interpretation.    COORDINATION OF CARE: 8:00 AM-Discussed treatment plan which includes I&D, antibiotics, and dental f/u with pt at bedside and pt agreed to plan.    INCISION AND DRAINAGE Performed by: Shanna Cisco, MD  Consent:  Verbal consent obtained. Risks and benefits: risks, benefits and alternatives were discussed Type: abscess Body area: Gumline of 2nd molar on lower left Anesthesia: local infiltration Incision was made with a scalpel. Local anesthetic: benzocaine w/o epinephrine Anesthetic total: 0.5 ml Complexity: complex Blunt dissection to break up loculations Drainage: purulent Drainage amount: small Packing material: None Patient tolerance: Patient tolerated the procedure well with no immediate complications.    Labs Review Labs Reviewed - No data to display  Imaging Review No results found.  EKG Interpretation   None       MDM   1. Dental abscess   2. Facial cellulitis    Pt is a 67 y.o. female with Pmhx as above who presents with several days of dental pain, facial swelling.  No fevers, trismus, difficulty swallowing. On PE, VSS, pt in NAD.  Pt has swelling, and purulence at gumline of tooth 18, mild L facial swelling w/o palpable facial abscess.  Posterior oropharynx clear.  I&D done w/ purulent drainage.  Will place on clinda (pen allergic) and have her follow closely with her dentist.  Return precautions given for new or worsening symptoms including fever, trismus, facial swelling, trouble swallowing.          I personally performed the services described in this documentation, which was scribed in my presence. The recorded information has been reviewed and is accurate.     Shanna Cisco, MD 05/25/13 [phone]

## 2013-05-25 NOTE — ED Notes (Signed)
Patient c/o left lower dental pain. Patient thinks gum is infected, unsure of any fevers. Patient reports taking 800mg  ibuprofen at 0645 with some relief.

## 2013-05-25 NOTE — ED Notes (Signed)
MD at bedside. 

## 2013-05-26 ENCOUNTER — Ambulatory Visit (HOSPITAL_COMMUNITY)
Admission: RE | Admit: 2013-05-26 | Discharge: 2013-05-26 | Disposition: A | Discharge status: Home / Self Care | Payer: Medicare Other | Source: Ambulatory Visit | Attending: Internal Medicine | Admitting: Internal Medicine

## 2013-05-26 ENCOUNTER — Encounter (HOSPITAL_COMMUNITY): Payer: Self-pay | Admitting: *Deleted

## 2013-05-26 ENCOUNTER — Encounter (HOSPITAL_COMMUNITY)
Admission: RE | Disposition: A | Discharge status: Home / Self Care | Payer: Self-pay | Source: Ambulatory Visit | Attending: Internal Medicine

## 2013-05-26 DIAGNOSIS — D126 Benign neoplasm of colon, unspecified: Secondary | ICD-10-CM | POA: Insufficient documentation

## 2013-05-26 DIAGNOSIS — Z860100 Personal history of colon polyps, unspecified: Secondary | ICD-10-CM | POA: Insufficient documentation

## 2013-05-26 DIAGNOSIS — E785 Hyperlipidemia, unspecified: Secondary | ICD-10-CM | POA: Insufficient documentation

## 2013-05-26 DIAGNOSIS — I10 Essential (primary) hypertension: Secondary | ICD-10-CM | POA: Insufficient documentation

## 2013-05-26 DIAGNOSIS — Z1211 Encounter for screening for malignant neoplasm of colon: Secondary | ICD-10-CM

## 2013-05-26 DIAGNOSIS — Z7982 Long term (current) use of aspirin: Secondary | ICD-10-CM | POA: Insufficient documentation

## 2013-05-26 HISTORY — PX: COLONOSCOPY: SHX5424

## 2013-05-26 SURGERY — COLONOSCOPY
Anesthesia: Moderate Sedation

## 2013-05-26 MED ORDER — MEPERIDINE HCL 100 MG/ML IJ SOLN
INTRAMUSCULAR | Status: AC
  Filled 2013-05-26: qty 2

## 2013-05-26 MED ORDER — MEPERIDINE HCL 100 MG/ML IJ SOLN
INTRAMUSCULAR | Status: DC | PRN
  Administered 2013-05-26: 25 mg via INTRAVENOUS
  Administered 2013-05-26: 50 mg via INTRAVENOUS

## 2013-05-26 MED ORDER — STERILE WATER FOR IRRIGATION IR SOLN
Status: DC | PRN
  Administered 2013-05-26: 11:00:00

## 2013-05-26 MED ORDER — SODIUM CHLORIDE 0.9 % IV SOLN
INTRAVENOUS | Status: DC
  Administered 2013-05-26: 1000 mL via INTRAVENOUS

## 2013-05-26 MED ORDER — ONDANSETRON HCL 4 MG/2ML IJ SOLN
INTRAMUSCULAR | Status: AC
  Filled 2013-05-26: qty 2

## 2013-05-26 MED ORDER — MIDAZOLAM HCL 5 MG/5ML IJ SOLN
INTRAMUSCULAR | Status: DC | PRN
  Administered 2013-05-26: 1 mg via INTRAVENOUS
  Administered 2013-05-26: 2 mg via INTRAVENOUS

## 2013-05-26 MED ORDER — MIDAZOLAM HCL 5 MG/5ML IJ SOLN
INTRAMUSCULAR | Status: AC
  Filled 2013-05-26: qty 10

## 2013-05-26 MED ORDER — ONDANSETRON HCL 4 MG/2ML IJ SOLN
INTRAMUSCULAR | Status: DC | PRN
  Administered 2013-05-26: 4 mg via INTRAVENOUS

## 2013-05-26 NOTE — Op Note (Signed)
[facility] [address] [place], [address]   COLONOSCOPY PROCEDURE REPORT  PATIENT: [name], Karen Roseman.  MR#:         [id number] BIRTHDATE: [birth year] , 67  yrs. old GENDER: Female ENDOSCOPIST: R.  Roetta Sessions, MD FACP FACG REFERRED BY:  Syliva Overman, M.D. PROCEDURE DATE:  05/26/2013 PROCEDURE:     Ileocolonoscopy with multiple snare polypectomies  INDICATIONS: Distant history of colonic adenoma  INFORMED CONSENT:  The risks, benefits, alternatives and imponderables including but not limited to bleeding, perforation as well as the possibility of a missed lesion have been reviewed.  The potential for biopsy, lesion removal, etc. have also been discussed.  Questions have been answered.  All parties agreeable. Please see the history and physical in the medical record for more information.  MEDICATIONS: Versed 3 mg IV and Demerol 75 mg IV in divided doses. Zofran 4 mg IV  DESCRIPTION OF PROCEDURE:  After a digital rectal exam was performed, the EC-3890Li ([id number])  colonoscope was advanced from the anus through the rectum and colon to the area of the cecum, ileocecal valve and appendiceal orifice.  The cecum was deeply intubated.  These structures were well-seen and photographed for the record.  From the level of the cecum and ileocecal valve, the scope was slowly and cautiously withdrawn.  The mucosal surfaces were carefully surveyed utilizing scope tip deflection to facilitate fold flattening as needed.  The scope was pulled down into the rectum where a thorough examination including retroflexion was performed.    FINDINGS:  Adequate preparation. Normal rectum.. (2) 4 mm polyps in the transverse segment;  (1) 6 mm polyp in the mid descending segment; otherwise, the remainder of the colonic mucosa appeared normal. The distal 5 cm of terminal ileal mucosa also appeared normal. THERAPEUTIC / DIAGNOSTIC MANEUVERS PERFORMED:  The above-mentioned polyps  were cold snare removed and recovered for pathology  COMPLICATIONS: None  CECAL WITHDRAWAL TIME:  13 minutes  IMPRESSION:  Multiple colonic polyps removed as described above  RECOMMENDATIONS: Followup on pathology  _______________________________ eSigned:  R. Roetta Sessions, MD FACP [facility] 05/26/2013 11:52 AM   CC:

## 2013-05-26 NOTE — H&P (View-Only) (Signed)
Primary Care Physician:  Margaret Simpson, MD  Primary Gastroenterologist:  Michael Rourk, MD   Chief Complaint  Patient presents with  . Colonoscopy    past time for next one/ no problems    HPI:  Karen [name] is a 67 y.o. female here to schedule surveillance colonoscopy. H/o adenomatous polyps in 2006. She has chronic constipation controlled with fiber. 16 pounds weight loss since 09/2012 intentionally. Denies abdominal pain, diarrhea, melena, brbpr, heartburn, dysphagia, vomiting, anorexia.  Current Outpatient Prescriptions  Medication Sig Dispense Refill  . aspirin 81 MG tablet Take 81 mg by mouth daily.      . Cholecalciferol (D3-1000) 1000 UNITS tablet Take 1,000 Units by mouth daily.      . Coenzyme Q10 (CO Q 10 PO) Take by mouth.      . Ginger, Zingiber officinalis, (GINGER ROOT) 550 MG CAPS Take by mouth.      . Glucosamine-Chondroitin (OSTEO BI-FLEX REGULAR STRENGTH) 250-200 MG TABS Take 1 tablet by mouth daily.      . ibuprofen (ADVIL,MOTRIN) 200 MG tablet Take 200 mg by mouth as needed for pain.      . lisinopril-hydrochlorothiazide (PRINZIDE,ZESTORETIC) 20-25 MG per tablet Take 1 tablet by mouth daily.  90 tablet  1  . Misc Natural Products (OSTEO BI-FLEX ADV DOUBLE ST PO) Take by mouth.      . Omega-3 Fatty Acids (FISH OIL) 1200 MG CAPS Take by mouth.       No current facility-administered medications for this visit.    Allergies as of 05/01/2013 - Review Complete 05/01/2013  Allergen Reaction Noted  . Penicillins  03/22/2010    Past Medical History  Diagnosis Date  . Hypertension   . Hyperlipidemia   . Arthritis   . Prediabetes 2014    lifestyle     Past Surgical History  Procedure Laterality Date  . Ankle surgery    . Abdominal hysterectomy  1983 approx    fibroids, partial  . Fracture surgery Right mid 1990's    pin put in and waas removed  . Colonoscopy  04/27/2005    RMR:Diminutive polyp at the splenic flexure cold biopsied/removed. Mid   descending polyp removed with cold snare technique. The remainder of the of the colon looked normal/ Normal rectum. inflammed adenomatous polyps.    Family History  Problem Relation Age of Onset  . Arthritis    . Diabetes      History   Social History  . Marital Status: Married    Spouse Name: N/A    Number of Children: N/A  . Years of Education: 12   Occupational History  . Not on file.   Social History Main Topics  . Smoking status: Never Smoker   . Smokeless tobacco: Not on file  . Alcohol Use: No  . Drug Use: No  . Sexual Activity:    Other Topics Concern  . Not on file   Social History Narrative  . No narrative on file      ROS:  General: Negative for anorexia, weight loss, fever, chills, fatigue, weakness. Eyes: Negative for vision changes.  ENT: Negative for hoarseness, difficulty swallowing , nasal congestion. CV: Negative for chest pain, angina, palpitations, dyspnea on exertion, peripheral edema.  Respiratory: Negative for dyspnea at rest, dyspnea on exertion, cough, sputum, wheezing.  GI: See history of present illness. GU:  Negative for dysuria, hematuria, urinary incontinence, urinary frequency, nocturnal urination.  MS: Negative for joint pain, low back pain.  Derm: Negative for   rash or itching.  Neuro: Negative for weakness, abnormal sensation, seizure, frequent headaches, memory loss, confusion.  Psych: Negative for anxiety, depression, suicidal ideation, hallucinations.  Endo: Negative for unusual weight change.  Heme: Negative for bruising or bleeding. Allergy: Negative for rash or hives.    Physical Examination:  BP 126/73  Pulse 73  Temp(Src) 98.7 F (37.1 C) (Oral)  Ht 5' 6.5" (1.689 m)  Wt 205 lb (92.987 kg)  BMI 32.60 kg/m2   General: Well-nourished, well-developed in no acute distress.  Head: Normocephalic, atraumatic.   Eyes: Conjunctiva pink, no icterus. Mouth: Oropharyngeal mucosa moist and pink , no lesions erythema or  exudate. Neck: Supple without thyromegaly, masses, or lymphadenopathy.  Lungs: Clear to auscultation bilaterally.  Heart: Regular rate and rhythm, no murmurs rubs or gallops.  Abdomen: Bowel sounds are normal, nontender, nondistended, no hepatosplenomegaly or masses, no abdominal bruits or    hernia , no rebound or guarding.   Rectal: deferred Extremities: No lower extremity edema. No clubbing or deformities.  Neuro: Alert and oriented x 4 , grossly normal neurologically.  Skin: Warm and dry, no rash or jaundice.   Psych: Alert and cooperative, normal mood and affect.  Labs: Lab Results  Component Value Date   WBC 5.1 09/11/2012   HGB 12.9 09/11/2012   HCT 37.5 09/11/2012   MCV 83.5 09/11/2012   PLT 416* 09/11/2012     Imaging Studies: No results found.    

## 2013-05-26 NOTE — Interval H&P Note (Signed)
History and Physical Interval Note:  05/26/2013 11:19 AM  Karen [name]  has presented today for surgery, with the diagnosis of H/O COLON POLYPS  The various methods of treatment have been discussed with the patient and family. After consideration of risks, benefits and other options for treatment, the patient has consented to  Procedure(s) with comments: COLONOSCOPY (N/A) - 11:00AM as a surgical intervention .  The patient's history has been reviewed, patient examined, no change in status, stable for surgery.  I have reviewed the patient's chart and labs.  Questions were answered to the patient's satisfaction.     No change. Colonoscopy per plan.The risks, benefits, limitations, alternatives and imponderables have been reviewed with the patient. Questions have been answered. All parties are agreeable.   [name]

## 2013-05-27 ENCOUNTER — Encounter: Payer: Self-pay | Admitting: Internal Medicine

## 2013-05-28 ENCOUNTER — Encounter (HOSPITAL_COMMUNITY): Payer: Self-pay | Admitting: Internal Medicine

## 2013-05-30 ENCOUNTER — Encounter: Payer: Self-pay | Admitting: Internal Medicine

## 2013-06-18 LAB — BASIC METABOLIC PANEL
BUN: 19 mg/dL (ref 6–23)
CO2: 30 mEq/L (ref 19–32)
Calcium: 9.6 mg/dL (ref 8.4–10.5)
Chloride: 102 mEq/L (ref 96–112)
Creat: 0.75 mg/dL (ref 0.50–1.10)
Glucose, Bld: 89 mg/dL (ref 70–99)
Potassium: 4 mEq/L (ref 3.5–5.3)
Sodium: 138 mEq/L (ref 135–145)

## 2013-06-18 LAB — LIPID PANEL
Cholesterol: 252 mg/dL — ABNORMAL HIGH (ref 0–200)
HDL: 49 mg/dL (ref >39)
LDL Cholesterol: 191 mg/dL — ABNORMAL HIGH (ref 0–99)
Total CHOL/HDL Ratio: 5.1 Ratio
Triglycerides: 60 mg/dL (ref <150)
VLDL: 12 mg/dL (ref 0–40)

## 2013-06-18 LAB — HEMOGLOBIN A1C
Hgb A1c MFr Bld: 5.8 % — ABNORMAL HIGH (ref <5.7)
Mean Plasma Glucose: 120 mg/dL — ABNORMAL HIGH (ref <117)

## 2013-06-25 ENCOUNTER — Encounter: Payer: Self-pay | Admitting: Family Medicine

## 2013-06-25 ENCOUNTER — Ambulatory Visit (INDEPENDENT_AMBULATORY_CARE_PROVIDER_SITE_OTHER): Payer: Medicare HMO | Admitting: Family Medicine

## 2013-06-25 ENCOUNTER — Encounter (INDEPENDENT_AMBULATORY_CARE_PROVIDER_SITE_OTHER): Payer: Self-pay

## 2013-06-25 VITALS — BP 128/74 | HR 98 | Temp 99.8°F | Resp 16 | Ht 66.0 in | Wt 197.0 lb

## 2013-06-25 DIAGNOSIS — E669 Obesity, unspecified: Secondary | ICD-10-CM

## 2013-06-25 DIAGNOSIS — J208 Acute bronchitis due to other specified organisms: Principal | ICD-10-CM

## 2013-06-25 DIAGNOSIS — G4700 Insomnia, unspecified: Secondary | ICD-10-CM

## 2013-06-25 DIAGNOSIS — B9689 Other specified bacterial agents as the cause of diseases classified elsewhere: Secondary | ICD-10-CM

## 2013-06-25 DIAGNOSIS — R7309 Other abnormal glucose: Secondary | ICD-10-CM

## 2013-06-25 DIAGNOSIS — J209 Acute bronchitis, unspecified: Secondary | ICD-10-CM

## 2013-06-25 DIAGNOSIS — I10 Essential (primary) hypertension: Secondary | ICD-10-CM

## 2013-06-25 DIAGNOSIS — R748 Abnormal levels of other serum enzymes: Secondary | ICD-10-CM

## 2013-06-25 DIAGNOSIS — R7303 Prediabetes: Secondary | ICD-10-CM

## 2013-06-25 DIAGNOSIS — A499 Bacterial infection, unspecified: Secondary | ICD-10-CM

## 2013-06-25 DIAGNOSIS — E8881 Metabolic syndrome: Secondary | ICD-10-CM

## 2013-06-25 DIAGNOSIS — E785 Hyperlipidemia, unspecified: Secondary | ICD-10-CM

## 2013-06-25 LAB — HEPATIC FUNCTION PANEL
ALT: 38 U/L — AB (ref 0–35)
AST: 35 U/L (ref 0–37)
Albumin: 3.9 g/dL (ref 3.5–5.2)
Alkaline Phosphatase: 81 U/L (ref 39–117)
BILIRUBIN DIRECT: 0.1 mg/dL (ref 0.0–0.3)
BILIRUBIN INDIRECT: 0.3 mg/dL (ref 0.0–0.9)
Total Bilirubin: 0.4 mg/dL (ref 0.3–1.2)
Total Protein: 7.1 g/dL (ref 6.0–8.3)

## 2013-06-25 MED ORDER — LEVOFLOXACIN 500 MG PO TABS
500.0000 mg | ORAL_TABLET | Freq: Every day | ORAL | Status: AC
Start: 2013-06-25 — End: 2013-06-30

## 2013-06-25 MED ORDER — PROMETHAZINE-DM 6.25-15 MG/5ML PO SYRP: ORAL_SOLUTION | ORAL | Status: AC

## 2013-06-25 MED ORDER — SIMVASTATIN 20 MG PO TABS: 20.0000 mg | ORAL_TABLET | Freq: Every day | ORAL | Status: DC

## 2013-06-25 MED ORDER — TEMAZEPAM 15 MG PO CAPS: 15.0000 mg | ORAL_CAPSULE | Freq: Every evening | ORAL | Status: DC | PRN

## 2013-06-25 MED ORDER — BENZONATATE 100 MG PO CAPS: 100.0000 mg | ORAL_CAPSULE | Freq: Three times a day (TID) | ORAL | Status: DC | PRN

## 2013-06-25 NOTE — Patient Instructions (Addendum)
F/u in May, call if youi need me before  Congrats on weight loss and improved blood sugar, keep it up  Cholesterol is higher, pls cut back on fat intake , new is zocor 1 at bedtime  You will get info on good sleep hygien and start restoril at bedtime  Commit to exercise daily  At the [facility] are being treated for acute bronchitis  Fasting lipid, cmp and HBA1C

## 2013-06-26 NOTE — Assessment & Plan Note (Signed)
Improved , pt congratulated on this, and encouraged to continue healthy lifestyle changes

## 2013-06-26 NOTE — Assessment & Plan Note (Signed)
Sleep hygiene reviewed, pt will also start restoril

## 2013-06-26 NOTE — Assessment & Plan Note (Signed)
Decongestant and antibiotic prescribed, also cough suppressant

## 2013-06-26 NOTE — Assessment & Plan Note (Signed)
Controlled, no change in medication DASH diet and commitment to daily physical activity for a minimum of 30 minutes discussed and encouraged, as a part of hypertension management. The importance of attaining a healthy weight is also discussed.  

## 2013-06-26 NOTE — Assessment & Plan Note (Signed)
Deteriorated, pt reports overindulgence in hot wings. Hyperlipidemia:Low fat diet discussed and encouraged.  Start statin also

## 2013-06-26 NOTE — Progress Notes (Signed)
   Subjective:    Patient ID: Karen [name], female    DOB: Jan 10, 1946, 68 y.o.   MRN: [id number]  HPI The PT is here for follow up and re-evaluation of chronic medical conditions, medication management and review of any available recent lab and radiology data.  Preventive health is updated, specifically  Cancer screening and Immunization.   Recent colonoscopy, pt has adenomatous polyp and is for rept study in 3 years. The PT denies any adverse reactions to current medications since the last visit.  C/o poor sleep, relationship with her spouse is not good, depression screen is negative, just unhappy in her home and states she intends to leave in the Spring. A lot of positive support from her 3 children and her grandchildren. A lot of support from her spiritual home base also 3 day h/o cough, fever and chills, had very close contact with her ill grandchild this past weekend who has pneumonia Has done extremely well with reduced sweet intake and exercise , 18 pound weight loss and improved blood sugar       Review of Systems See HPI  Denies sinus pressure, nasal congestion, ear pain or sore throat.  Denies chest pains, palpitations and leg swelling Denies abdominal pain, nausea, vomiting,diarrhea or constipation.   Denies dysuria, frequency, hesitancy or incontinence. Denies joint pain, swelling and limitation in mobility. Denies headaches, seizures, numbness, or tingling.  Denies skin break down or rash.        Objective:   Physical Exam Patient alert and oriented and in no cardiopulmonary distress.  HEENT: No facial asymmetry, EOMI, no sinus tenderness,  oropharynx pink and moist.  Neck supple no adenopathy.  Chest:Adequate air entry, few crackles in right mid lung, no wheezes  CVS: S1, S2 no murmurs, no S3.  ABD: Soft non tender. Bowel sounds normal.  Ext: No edema  MS: Adequate ROM spine, shoulders, hips and knees.  Skin: Intact, no ulcerations or rash  noted.  Psych: Good eye contact, normal affect. Memory intact not anxious or depressed appearing.  CNS: CN 2-12 intact, power, tone and sensation normal throughout.        Assessment & Plan:

## 2013-06-26 NOTE — Assessment & Plan Note (Signed)
Improved. Pt applauded on succesful weight loss through lifestyle change, and encouraged to continue same. Weight loss goal set for the next several months.  

## 2013-06-27 NOTE — Addendum Note (Signed)
Addended by: Eual Fines on: 06/27/2013 02:02 PM   Modules accepted: Orders

## 2013-07-11 NOTE — Telephone Encounter (Signed)
Contact attempted re need for pneumonia vaccine unable to contact pt , no msg left

## 2013-07-15 ENCOUNTER — Encounter (HOSPITAL_COMMUNITY): Payer: Self-pay | Admitting: Emergency Medicine

## 2013-07-15 ENCOUNTER — Telehealth: Payer: Self-pay | Admitting: Family Medicine

## 2013-07-15 ENCOUNTER — Emergency Department (HOSPITAL_COMMUNITY)
Admission: EM | Admit: 2013-07-15 | Discharge: 2013-07-15 | Disposition: A | Discharge status: Home / Self Care | Payer: Medicare HMO | Attending: Emergency Medicine | Admitting: Emergency Medicine

## 2013-07-15 DIAGNOSIS — R110 Nausea: Secondary | ICD-10-CM | POA: Insufficient documentation

## 2013-07-15 DIAGNOSIS — R5383 Other fatigue: Secondary | ICD-10-CM

## 2013-07-15 DIAGNOSIS — R109 Unspecified abdominal pain: Secondary | ICD-10-CM | POA: Insufficient documentation

## 2013-07-15 DIAGNOSIS — R42 Dizziness and giddiness: Secondary | ICD-10-CM | POA: Insufficient documentation

## 2013-07-15 DIAGNOSIS — Z880 Allergy status to penicillin: Secondary | ICD-10-CM | POA: Insufficient documentation

## 2013-07-15 DIAGNOSIS — E785 Hyperlipidemia, unspecified: Secondary | ICD-10-CM | POA: Insufficient documentation

## 2013-07-15 DIAGNOSIS — R197 Diarrhea, unspecified: Secondary | ICD-10-CM | POA: Insufficient documentation

## 2013-07-15 DIAGNOSIS — Z7982 Long term (current) use of aspirin: Secondary | ICD-10-CM | POA: Insufficient documentation

## 2013-07-15 DIAGNOSIS — E860 Dehydration: Secondary | ICD-10-CM

## 2013-07-15 DIAGNOSIS — Z8639 Personal history of other endocrine, nutritional and metabolic disease: Secondary | ICD-10-CM | POA: Insufficient documentation

## 2013-07-15 DIAGNOSIS — Z90710 Acquired absence of both cervix and uterus: Secondary | ICD-10-CM | POA: Insufficient documentation

## 2013-07-15 DIAGNOSIS — R5381 Other malaise: Secondary | ICD-10-CM | POA: Insufficient documentation

## 2013-07-15 DIAGNOSIS — Z79899 Other long term (current) drug therapy: Secondary | ICD-10-CM | POA: Insufficient documentation

## 2013-07-15 DIAGNOSIS — M129 Arthropathy, unspecified: Secondary | ICD-10-CM | POA: Insufficient documentation

## 2013-07-15 DIAGNOSIS — Z9889 Other specified postprocedural states: Secondary | ICD-10-CM | POA: Insufficient documentation

## 2013-07-15 DIAGNOSIS — Z862 Personal history of diseases of the blood and blood-forming organs and certain disorders involving the immune mechanism: Secondary | ICD-10-CM | POA: Insufficient documentation

## 2013-07-15 DIAGNOSIS — I10 Essential (primary) hypertension: Secondary | ICD-10-CM | POA: Insufficient documentation

## 2013-07-15 LAB — URINALYSIS, ROUTINE W REFLEX MICROSCOPIC
Bilirubin Urine: NEGATIVE
Glucose, UA: NEGATIVE mg/dL
HGB URINE DIPSTICK: NEGATIVE
Nitrite: NEGATIVE
PROTEIN: NEGATIVE mg/dL
Specific Gravity, Urine: 1.01 (ref 1.005–1.030)
UROBILINOGEN UA: 0.2 mg/dL (ref 0.0–1.0)
pH: 6 (ref 5.0–8.0)

## 2013-07-15 LAB — BASIC METABOLIC PANEL
BUN: 22 mg/dL (ref 6–23)
CALCIUM: 8.9 mg/dL (ref 8.4–10.5)
CO2: 26 mEq/L (ref 19–32)
Chloride: 96 mEq/L (ref 96–112)
Creatinine, Ser: 0.98 mg/dL (ref 0.50–1.10)
GFR calc Af Amer: 69 mL/min — ABNORMAL LOW (ref >90)
GFR, EST NON AFRICAN AMERICAN: 59 mL/min — AB (ref >90)
GLUCOSE: 108 mg/dL — AB (ref 70–99)
Potassium: 3.3 mEq/L — ABNORMAL LOW (ref 3.7–5.3)
Sodium: 135 mEq/L — ABNORMAL LOW (ref 137–147)

## 2013-07-15 LAB — CBC WITH DIFFERENTIAL/PLATELET
BASOS ABS: 0 10*3/uL (ref 0.0–0.1)
Basophils Relative: 0 % (ref 0–1)
EOS ABS: 0 10*3/uL (ref 0.0–0.7)
EOS PCT: 0 % (ref 0–5)
HCT: 34.6 % — ABNORMAL LOW (ref 36.0–46.0)
Hemoglobin: 11.7 g/dL — ABNORMAL LOW (ref 12.0–15.0)
LYMPHS ABS: 1.7 10*3/uL (ref 0.7–4.0)
Lymphocytes Relative: 13 % (ref 12–46)
MCH: 30.2 pg (ref 26.0–34.0)
MCHC: 33.8 g/dL (ref 30.0–36.0)
MCV: 89.2 fL (ref 78.0–100.0)
Monocytes Absolute: 1.4 10*3/uL — ABNORMAL HIGH (ref 0.1–1.0)
Monocytes Relative: 10 % (ref 3–12)
Neutro Abs: 10.6 10*3/uL — ABNORMAL HIGH (ref 1.7–7.7)
Neutrophils Relative %: 77 % (ref 43–77)
Platelets: 401 10*3/uL — ABNORMAL HIGH (ref 150–400)
RBC: 3.88 MIL/uL (ref 3.87–5.11)
RDW: 12.9 % (ref 11.5–15.5)
WBC: 13.7 10*3/uL — ABNORMAL HIGH (ref 4.0–10.5)

## 2013-07-15 LAB — URINE MICROSCOPIC-ADD ON

## 2013-07-15 MED ORDER — DIPHENOXYLATE-ATROPINE 2.5-0.025 MG PO TABS: 1.0000 | ORAL_TABLET | Freq: Four times a day (QID) | ORAL | Status: DC | PRN

## 2013-07-15 MED ORDER — SODIUM CHLORIDE 0.9 % IV SOLN
Freq: Once | INTRAVENOUS | Status: AC
  Administered 2013-07-15: 18:00:00 via INTRAVENOUS

## 2013-07-15 MED ORDER — ONDANSETRON HCL 4 MG PO TABS: 4.0000 mg | ORAL_TABLET | Freq: Every day | ORAL | Status: DC | PRN

## 2013-07-15 MED ORDER — DIPHENOXYLATE-ATROPINE 2.5-0.025 MG PO TABS
2.0000 | ORAL_TABLET | Freq: Once | ORAL | Status: AC
  Administered 2013-07-15: 2 via ORAL
  Filled 2013-07-15: qty 2

## 2013-07-15 NOTE — Telephone Encounter (Signed)
Vomiting no    Recommended treatment Hydration is important Fluids small frequent amounts as tolerated Good hygiene reduces transmission among family members Review Brat diet  Zofran 4 mg 1 tablet daily as needed for nausea and vomiting no more than 6 tablets   Diarrheayes  Recommended treatment  Imodium OTC  Can also offer Lomotil 1 tablet 4 times daily as needed no more than 10 tablets Good hygiene reduces transmission among family members Review Brat Diet  If patient starts to feel light headed or not passing much urine or becoming dehydrated will need to go to emergency room for IV hydration  Please call office if symptoms worsen or do not improve after 2-3 days

## 2013-07-15 NOTE — ED Provider Notes (Signed)
CSN: [id number]     Arrival date & time 07/15/13  1645 History  This chart was scribed for Karen Greek, MD by Elby Beck, ED Scribe. This patient was seen in room [facility]10/[facility]10 and the patient's care was started at 5:45 PM.   Chief Complaint  Patient presents with  . Diarrhea  . Nausea    The history is provided by the patient. No language interpreter was used.    HPI Comments: Karen [name] is a 68 y.o. female who presents to the Emergency Department complaining of persistent episodes of diarrhea over the past 5 days, with multiple episodes occurring per day. She states that she has not noticed any blood or black coloration in any of these stools, with the exception that today she had 1 diarrhea stool that was "darker than normal". She states that she had a Chocolate Boost this morning and believes this could be the cause. She reports associated intermittent nausea over the past 5 days, as well as lightheadedness and decreased urine output. She reports that she has been having cramping abdominal pain just prior to her episodes of diarrhea. She denies vomiting, chest pain, SOB, palpitations or any other symptoms.    Past Medical History  Diagnosis Date  . Hypertension   . Hyperlipidemia   . Arthritis   . Prediabetes 2014    lifestyle    Past Surgical History  Procedure Laterality Date  . Ankle surgery    . Abdominal hysterectomy  1983 approx    fibroids, partial  . Fracture surgery Right mid 1990's    pin put in and waas removed  . Colonoscopy  04/27/2005    [id number] polyp at the splenic flexure cold biopsied/removed. Mid  descending polyp removed with cold snare technique. The remainder of the of the colon looked normal/ Normal rectum. inflammed adenomatous polyps.  . Colonoscopy N/A 05/26/2013    Procedure: COLONOSCOPY;  Surgeon: Daneil Dolin, MD;  Location: [facility];  Service: Endoscopy;  Laterality: N/A;  11:00AM   Family History  Problem Relation  Age of Onset  . Arthritis    . Diabetes     History  Substance Use Topics  . Smoking status: Never Smoker   . Smokeless tobacco: Never Used  . Alcohol Use: No   OB History   Grav Para Term Preterm Abortions TAB SAB Ect Mult Living   4 3 3  1  1   3      Review of Systems  Respiratory: Negative for shortness of breath.   Cardiovascular: Negative for chest pain and palpitations.  Gastrointestinal: Positive for nausea, abdominal pain and diarrhea. Negative for vomiting.  Genitourinary: Positive for decreased urine volume.  Neurological: Positive for light-headedness.  All other systems reviewed and are negative.   Allergies  Penicillins  Home Medications   Current Outpatient Rx  Name  Route  Sig  Dispense  Refill  . aspirin 81 MG tablet   Oral   Take 81 mg by mouth daily.         . benzonatate (TESSALON) 100 MG capsule   Oral   Take 1 capsule (100 mg total) by mouth 3 (three) times daily as needed for cough.   21 capsule   0   . Cholecalciferol (D3-1000) 1000 UNITS tablet   Oral   Take 2,000 Units by mouth daily.          . Coenzyme Q10 (CO Q 10 PO)   Oral   Take 1 tablet  by mouth daily.          . Ginger, Zingiber officinalis, (GINGER ROOT) 550 MG CAPS   Oral   Take 1 capsule by mouth daily.          . Glucosamine-Chondroitin (OSTEO BI-FLEX REGULAR STRENGTH) 250-200 MG TABS   Oral   Take 1 tablet by mouth daily.         Marland Kitchen ibuprofen (ADVIL,MOTRIN) 200 MG tablet   Oral   Take 200 mg by mouth every 8 (eight) hours as needed.          Marland Kitchen lisinopril-hydrochlorothiazide (PRINZIDE,ZESTORETIC) 20-25 MG per tablet   Oral   Take 1 tablet by mouth daily.   90 tablet   1   . Omega-3 Fatty Acids (FISH OIL) 1200 MG CAPS   Oral   Take 1 capsule by mouth daily.          . simvastatin (ZOCOR) 20 MG tablet   Oral   Take 20 mg by mouth at bedtime.         . temazepam (RESTORIL) 15 MG capsule   Oral   Take 1 capsule (15 mg total) by mouth at  bedtime as needed for sleep.   30 capsule   5   . diphenoxylate-atropine (LOMOTIL) 2.5-0.025 MG per tablet   Oral   Take 1 tablet by mouth 4 (four) times daily as needed for diarrhea or loose stools.   10 tablet   0   . ondansetron (ZOFRAN) 4 MG tablet   Oral   Take 1 tablet (4 mg total) by mouth daily as needed for nausea or vomiting.   6 tablet   0    Triage Vitals: BP 109/66  Pulse 112  Temp(Src) 98.8 F (37.1 C) (Oral)  Resp 18  Ht 5\' 6"  (1.676 m)  Wt 193 lb (87.544 kg)  BMI 31.17 kg/m2  SpO2 99%  Physical Exam  Constitutional: She is oriented to person, place, and time. She appears well-developed and well-nourished. No distress.  HENT:  Head: Normocephalic and atraumatic.  Right Ear: Hearing normal.  Left Ear: Hearing normal.  Nose: Nose normal.  Mouth/Throat: Oropharynx is clear and moist and mucous membranes are normal.  Eyes: Conjunctivae and EOM are normal. Pupils are equal, round, and reactive to light.  Neck: Normal range of motion. Neck supple.  Cardiovascular: Regular rhythm, S1 normal and S2 normal.  Exam reveals no gallop and no friction rub.   No murmur heard. Pulmonary/Chest: Effort normal and breath sounds normal. No respiratory distress. She exhibits no tenderness.  Abdominal: Soft. Normal appearance and bowel sounds are normal. There is no hepatosplenomegaly. There is no tenderness. There is no rebound, no guarding, no tenderness at McBurney's point and negative Murphy's sign. No hernia.  Musculoskeletal: Normal range of motion.  Neurological: She is alert and oriented to person, place, and time. She has normal strength. No cranial nerve deficit or sensory deficit. Coordination normal. GCS eye subscore is 4. GCS verbal subscore is 5. GCS motor subscore is 6.  Skin: Skin is warm, dry and intact. No rash noted. No cyanosis.  Psychiatric: She has a normal mood and affect. Her speech is normal and behavior is normal. Thought content normal.    ED Course   Procedures (including critical care time)  DIAGNOSTIC STUDIES: Oxygen Saturation is 99% on RA, normal by my interpretation.    COORDINATION OF CARE: 5:47 PM- Discussed plan to obtain diagnostic lab work. Will also order IV fluids. Pt advised  of plan for treatment and pt agrees.  Medications  0.9 %  sodium chloride infusion ( Intravenous New Bag/Given 07/15/13 1756)  0.9 %  sodium chloride infusion ( Intravenous Stopped 07/15/13 1910)   Labs Review Labs Reviewed  CBC WITH DIFFERENTIAL - Abnormal; Notable for the following:    WBC 13.7 (*)    Hemoglobin 11.7 (*)    HCT 34.6 (*)    Platelets 401 (*)    Neutro Abs 10.6 (*)    Monocytes Absolute 1.4 (*)    All other components within normal limits  BASIC METABOLIC PANEL - Abnormal; Notable for the following:    Sodium 135 (*)    Potassium 3.3 (*)    Glucose, Bld 108 (*)    GFR calc non Af Amer 59 (*)    GFR calc Af Amer 69 (*)    All other components within normal limits  URINALYSIS, ROUTINE W REFLEX MICROSCOPIC - Abnormal; Notable for the following:    Ketones, ur TRACE (*)    Leukocytes, UA MODERATE (*)    All other components within normal limits  URINE MICROSCOPIC-ADD ON - Abnormal; Notable for the following:    Squamous Epithelial / LPF MANY (*)    Bacteria, UA FEW (*)    All other components within normal limits  URINE CULTURE   Imaging Review No results found.  EKG Interpretation    Date/Time:  Tuesday July 15 2013 17:42:35 EST Ventricular Rate:  97 PR Interval:  166 QRS Duration: 86 QT Interval:  342 QTC Calculation: 434 R Axis:   36 Text Interpretation:  Normal sinus rhythm Possible Anterior infarct , age undetermined Abnormal ECG No previous ECGs available Confirmed by POLLINA  MD, CHRISTOPHER ([id number]) on 07/15/2013 6:11:28 PM            MDM  Diagnosis: 1. Diarrhea 2. Mild dehydration   Patient presents to the ER for evaluation of diarrhea, weakness. She is concerned about possible dehydration. BUN  and creatinine are unremarkable. Urinalysis is not significantly concentrated. She did have white blood cells in the urine with leukocytes, but many squamous epithelial cells. Will await urine culture, as patient's primary complaint is diarrhea and antibiotics could worsen the diarrhea.  Patient was administered IV fluids here in the ER. She has done well. Abdominal exam is benign and nontender. No concern for diverticulitis or other significant intra-abdominal process. Symptoms likely viral in nature.  I personally performed the services described in this documentation, which was scribed in my presence. The recorded information has been reviewed and is accurate.    Karen Greek, MD 07/15/13 2029

## 2013-07-15 NOTE — ED Notes (Signed)
Dr. Betsey Holiday in with pt.

## 2013-07-15 NOTE — ED Notes (Signed)
Pt c/o nausea, lightheaded, decreased urine output, and diarrhea x 5 days.  C/O lower abd pain prior to having diarrhea.

## 2013-07-15 NOTE — Discharge Instructions (Signed)
Dehydration, Adult Dehydration means your body does not have as much fluid as it needs. Your kidneys, brain, and heart will not work properly without the right amount of fluids and salt.  HOME CARE  Ask your doctor how to replace body fluid losses (rehydrate).  Drink enough fluids to keep your pee (urine) clear or pale yellow.  Drink small amounts of fluids often if you feel sick to your stomach (nauseous) or throw up (vomit).  Eat like you normally do.  Avoid:  Foods or drinks high in sugar.  Bubbly (carbonated) drinks.  Juice.  Very hot or cold fluids.  Drinks with caffeine.  Fatty, greasy foods.  Alcohol.  Tobacco.  Eating too much.  Gelatin desserts.  Wash your hands to avoid spreading germs (bacteria, viruses).  Only take medicine as told by your doctor.  Keep all doctor visits as told. GET HELP RIGHT AWAY IF:   You cannot drink something without throwing up.  You get worse even with treatment.  Your vomit has blood in it or looks greenish.  Your poop (stool) has blood in it or looks black and tarry.  You have not peed in 6 to 8 hours.  You pee a small amount of very dark pee.  You have a fever.  You pass out (faint).  You have belly (abdominal) pain that gets worse or stays in one spot (localizes).  You have a rash, stiff neck, or bad headache.  You get easily annoyed, sleepy, or are hard to wake up.  You feel weak, dizzy, or very thirsty. MAKE SURE YOU:   Understand these instructions.  Will watch your condition.  Will get help right away if you are not doing well or get worse. Document Released: 03/25/2009 Document Revised: 08/21/2011 Document Reviewed: 01/16/2011 Exit[employer] Patient Information 2014 [employer]. Diarrhea Diarrhea is watery poop (stool). It can make you feel weak, tired, thirsty, or give you a dry mouth (signs of dehydration). Watery poop is a sign of another problem, most often an infection. It often lasts 2 3 days.  It can last longer if it is a sign of something serious. Take care of yourself as told by your doctor. HOME CARE   Drink 1 cup (8 ounces) of fluid each time you have watery poop.  Do not drink the following fluids:  Those that contain simple sugars (fructose, glucose, galactose, lactose, sucrose, maltose).  Sports drinks.  Fruit juices.  Whole milk products.  Sodas.  Drinks with caffeine (coffee, tea, soda) or alcohol.  Oral rehydration solution may be used if the doctor says it is okay. You may make your own solution. Follow this recipe:    teaspoon table salt.   teaspoon baking soda.   teaspoon salt substitute containing potassium chloride.  1 tablespoons sugar.  1 liter (34 ounces) of water.  Avoid the following foods:  High fiber foods, such as raw fruits and vegetables.  Nuts, seeds, and whole grain breads and cereals.   Those that are sweetened with sugar alcohols (xylitol, sorbitol, mannitol).  Try eating the following foods:  Starchy foods, such as rice, toast, pasta, low-sugar cereal, oatmeal, baked potatoes, crackers, and bagels.  Bananas.  Applesauce.  Eat probiotic-rich foods, such as yogurt and milk products that are fermented.  Wash your hands well after each time you have watery poop.  Only take medicine as told by your doctor.  Take a warm bath to help lessen burning or pain from having watery poop. GET HELP RIGHT AWAY   IF:   You cannot drink fluids without throwing up (vomiting).  You keep throwing up.  You have blood in your poop, or your poop looks black and tarry.  You do not pee (urinate) in 6 8 hours, or there is only a small amount of very dark pee.  You have belly (abdominal) pain that gets worse or stays in the same spot (localizes).  You are weak, dizzy, confused, or lightheaded.  You have a very bad headache.  Your watery poop gets worse or does not get better.  You have a fever or lasting symptoms for more than 2 3  days.  You have a fever and your symptoms suddenly get worse. MAKE SURE YOU:   Understand these instructions.  Will watch your condition.  Will get help right away if you are not doing well or get worse. Document Released: 11/15/2007 Document Revised: 02/21/2012 Document Reviewed: 02/04/2012 Exit[employer] Patient Information 2014 [employer].  

## 2013-07-17 ENCOUNTER — Telehealth: Payer: Self-pay

## 2013-07-17 LAB — URINE CULTURE

## 2013-07-17 MED ORDER — MECLIZINE HCL 12.5 MG PO TABS: 12.5000 mg | ORAL_TABLET | Freq: Three times a day (TID) | ORAL | Status: DC | PRN

## 2013-07-17 NOTE — Telephone Encounter (Signed)
Patient aware and will go to the ED if she worsens.   She will get meds and call in am if she is no better.

## 2013-07-17 NOTE — Telephone Encounter (Signed)
pls send in antivert 12.5 mg three times daily as needed, for dizziness.#12 only.let her know Discuss BRATdiet with her She may need re eval in urgent care /ed. Advise  chem 7 stat and CBC and diff stat, since WBC and potassium were abn, if worse definitely needs to go to the ED for re eval page me with questions pls)

## 2013-07-17 NOTE — Addendum Note (Signed)
Addended by: Denman George B on: 07/17/2013 01:22 PM   Modules accepted: Orders

## 2013-07-18 ENCOUNTER — Inpatient Hospital Stay (HOSPITAL_COMMUNITY)
Admission: EM | Admit: 2013-07-18 | Discharge: 2013-07-22 | DRG: 372 | Disposition: A | Discharge status: Home / Self Care | Payer: Medicare HMO | Attending: Family Medicine | Admitting: Family Medicine

## 2013-07-18 ENCOUNTER — Encounter (HOSPITAL_COMMUNITY): Payer: Self-pay | Admitting: Emergency Medicine

## 2013-07-18 DIAGNOSIS — D72829 Elevated white blood cell count, unspecified: Secondary | ICD-10-CM | POA: Diagnosis present

## 2013-07-18 DIAGNOSIS — I10 Essential (primary) hypertension: Secondary | ICD-10-CM | POA: Insufficient documentation

## 2013-07-18 DIAGNOSIS — E785 Hyperlipidemia, unspecified: Secondary | ICD-10-CM | POA: Diagnosis present

## 2013-07-18 DIAGNOSIS — R1110 Vomiting, unspecified: Secondary | ICD-10-CM

## 2013-07-18 DIAGNOSIS — E860 Dehydration: Secondary | ICD-10-CM | POA: Diagnosis present

## 2013-07-18 DIAGNOSIS — Z833 Family history of diabetes mellitus: Secondary | ICD-10-CM

## 2013-07-18 DIAGNOSIS — E871 Hypo-osmolality and hyponatremia: Secondary | ICD-10-CM | POA: Diagnosis present

## 2013-07-18 DIAGNOSIS — E876 Hypokalemia: Secondary | ICD-10-CM | POA: Diagnosis present

## 2013-07-18 DIAGNOSIS — A0472 Enterocolitis due to Clostridium difficile, not specified as recurrent: Secondary | ICD-10-CM

## 2013-07-18 DIAGNOSIS — N179 Acute kidney failure, unspecified: Secondary | ICD-10-CM | POA: Diagnosis present

## 2013-07-18 DIAGNOSIS — M129 Arthropathy, unspecified: Secondary | ICD-10-CM | POA: Diagnosis present

## 2013-07-18 DIAGNOSIS — R197 Diarrhea, unspecified: Secondary | ICD-10-CM | POA: Diagnosis present

## 2013-07-18 DIAGNOSIS — I959 Hypotension, unspecified: Secondary | ICD-10-CM | POA: Diagnosis present

## 2013-07-18 HISTORY — DX: Enterocolitis due to Clostridium difficile, not specified as recurrent: A04.72

## 2013-07-18 LAB — BASIC METABOLIC PANEL
BUN: 31 mg/dL — ABNORMAL HIGH (ref 6–23)
CALCIUM: 8.7 mg/dL (ref 8.4–10.5)
CO2: 23 mEq/L (ref 19–32)
Chloride: 93 mEq/L — ABNORMAL LOW (ref 96–112)
Creatinine, Ser: 3.4 mg/dL — ABNORMAL HIGH (ref 0.50–1.10)
GFR, EST AFRICAN AMERICAN: 15 mL/min — AB (ref >90)
GFR, EST NON AFRICAN AMERICAN: 13 mL/min — AB (ref >90)
GLUCOSE: 130 mg/dL — AB (ref 70–99)
Potassium: 3.4 mEq/L — ABNORMAL LOW (ref 3.7–5.3)
SODIUM: 133 meq/L — AB (ref 137–147)

## 2013-07-18 LAB — CLOSTRIDIUM DIFFICILE BY PCR: Toxigenic C. Difficile by PCR: POSITIVE — AB

## 2013-07-18 LAB — CBC WITH DIFFERENTIAL/PLATELET
BASOS ABS: 0 10*3/uL (ref 0.0–0.1)
BASOS PCT: 0 % (ref 0–1)
EOS ABS: 0 10*3/uL (ref 0.0–0.7)
EOS PCT: 0 % (ref 0–5)
HCT: 36.6 % (ref 36.0–46.0)
Hemoglobin: 12.9 g/dL (ref 12.0–15.0)
LYMPHS ABS: 1.2 10*3/uL (ref 0.7–4.0)
Lymphocytes Relative: 4 % — ABNORMAL LOW (ref 12–46)
MCH: 30.8 pg (ref 26.0–34.0)
MCHC: 35.2 g/dL (ref 30.0–36.0)
MCV: 87.4 fL (ref 78.0–100.0)
Monocytes Absolute: 3.4 10*3/uL — ABNORMAL HIGH (ref 0.1–1.0)
Monocytes Relative: 11 % (ref 3–12)
NEUTROS PCT: 85 % — AB (ref 43–77)
Neutro Abs: 25.8 10*3/uL — ABNORMAL HIGH (ref 1.7–7.7)
PLATELETS: 386 10*3/uL (ref 150–400)
RBC: 4.19 MIL/uL (ref 3.87–5.11)
RDW: 12.8 % (ref 11.5–15.5)
WBC: 30.4 10*3/uL — ABNORMAL HIGH (ref 4.0–10.5)

## 2013-07-18 LAB — CG4 I-STAT (LACTIC ACID): LACTIC ACID, VENOUS: 1.62 mmol/L (ref 0.5–2.2)

## 2013-07-18 MED ORDER — SODIUM CHLORIDE 0.9 % IV BOLUS (SEPSIS)
500.0000 mL | Freq: Once | INTRAVENOUS | Status: AC
  Administered 2013-07-18: 500 mL via INTRAVENOUS

## 2013-07-18 MED ORDER — ENOXAPARIN SODIUM 40 MG/0.4ML ~~LOC~~ SOLN: 40.0000 mg | SUBCUTANEOUS | Status: DC

## 2013-07-18 MED ORDER — ACETAMINOPHEN 325 MG PO TABS: 650.0000 mg | ORAL_TABLET | Freq: Four times a day (QID) | ORAL | Status: DC | PRN

## 2013-07-18 MED ORDER — OXYCODONE HCL 5 MG PO TABS
5.0000 mg | ORAL_TABLET | ORAL | Status: DC | PRN
  Administered 2013-07-18 – 2013-07-20 (×5): 5 mg via ORAL
  Filled 2013-07-18 (×5): qty 1

## 2013-07-18 MED ORDER — ALBUTEROL SULFATE (2.5 MG/3ML) 0.083% IN NEBU: 2.5000 mg | INHALATION_SOLUTION | RESPIRATORY_TRACT | Status: DC | PRN

## 2013-07-18 MED ORDER — CHLORHEXIDINE GLUCONATE CLOTH 2 % EX PADS: 6.0000 | MEDICATED_PAD | Freq: Every day | CUTANEOUS | Status: DC

## 2013-07-18 MED ORDER — ENOXAPARIN SODIUM 30 MG/0.3ML ~~LOC~~ SOLN
30.0000 mg | Freq: Every day | SUBCUTANEOUS | Status: DC
  Filled 2013-07-18 (×2): qty 0.3

## 2013-07-18 MED ORDER — MUPIROCIN 2 % EX OINT: 1.0000 "application " | TOPICAL_OINTMENT | Freq: Two times a day (BID) | CUTANEOUS | Status: DC

## 2013-07-18 MED ORDER — METRONIDAZOLE 500 MG PO TABS
500.0000 mg | ORAL_TABLET | Freq: Once | ORAL | Status: AC
  Administered 2013-07-18: 500 mg via ORAL
  Filled 2013-07-18: qty 1

## 2013-07-18 MED ORDER — HYDROMORPHONE HCL PF 1 MG/ML IJ SOLN
0.5000 mg | INTRAMUSCULAR | Status: DC | PRN
  Administered 2013-07-21: 0.5 mg via INTRAVENOUS
  Filled 2013-07-18: qty 1

## 2013-07-18 MED ORDER — VITAMIN D 1000 UNITS PO TABS
2000.0000 [IU] | ORAL_TABLET | Freq: Every day | ORAL | Status: DC
  Administered 2013-07-19 – 2013-07-22 (×4): 2000 [IU] via ORAL
  Filled 2013-07-18 (×5): qty 2

## 2013-07-18 MED ORDER — SIMVASTATIN 20 MG PO TABS
20.0000 mg | ORAL_TABLET | Freq: Every day | ORAL | Status: DC
  Administered 2013-07-18 – 2013-07-21 (×4): 20 mg via ORAL
  Filled 2013-07-18 (×5): qty 1

## 2013-07-18 MED ORDER — TEMAZEPAM 15 MG PO CAPS: 15.0000 mg | ORAL_CAPSULE | Freq: Every evening | ORAL | Status: DC | PRN

## 2013-07-18 MED ORDER — POTASSIUM CHLORIDE IN NACL 20-0.9 MEQ/L-% IV SOLN
INTRAVENOUS | Status: DC
Start: 2013-07-18 — End: 2013-07-20
  Administered 2013-07-18 – 2013-07-20 (×4): via INTRAVENOUS

## 2013-07-18 MED ORDER — ACETAMINOPHEN 650 MG RE SUPP: 650.0000 mg | Freq: Four times a day (QID) | RECTAL | Status: DC | PRN

## 2013-07-18 MED ORDER — ASPIRIN EC 81 MG PO TBEC
81.0000 mg | DELAYED_RELEASE_TABLET | Freq: Every day | ORAL | Status: DC
  Administered 2013-07-19 – 2013-07-22 (×4): 81 mg via ORAL
  Filled 2013-07-18 (×4): qty 1

## 2013-07-18 MED ORDER — ONDANSETRON HCL 4 MG PO TABS
4.0000 mg | ORAL_TABLET | Freq: Four times a day (QID) | ORAL | Status: DC | PRN
  Administered 2013-07-19 – 2013-07-21 (×5): 4 mg via ORAL
  Filled 2013-07-18 (×5): qty 1

## 2013-07-18 MED ORDER — VANCOMYCIN 50 MG/ML ORAL SOLUTION
125.0000 mg | Freq: Four times a day (QID) | ORAL | Status: DC
  Administered 2013-07-18 – 2013-07-22 (×16): 125 mg via ORAL
  Filled 2013-07-18 (×21): qty 2.5

## 2013-07-18 MED ORDER — SODIUM CHLORIDE 0.9 % IJ SOLN
3.0000 mL | Freq: Two times a day (BID) | INTRAMUSCULAR | Status: DC
  Administered 2013-07-18 – 2013-07-22 (×6): 3 mL via INTRAVENOUS

## 2013-07-18 MED ORDER — SODIUM CHLORIDE 0.9 % IV BOLUS (SEPSIS)
1000.0000 mL | Freq: Once | INTRAVENOUS | Status: AC
  Administered 2013-07-18: 1000 mL via INTRAVENOUS

## 2013-07-18 MED ORDER — ONDANSETRON HCL 4 MG/2ML IJ SOLN
4.0000 mg | Freq: Once | INTRAMUSCULAR | Status: AC
  Administered 2013-07-18: 4 mg via INTRAVENOUS
  Filled 2013-07-18: qty 2

## 2013-07-18 MED ORDER — BENZONATATE 100 MG PO CAPS: 100.0000 mg | ORAL_CAPSULE | Freq: Three times a day (TID) | ORAL | Status: DC | PRN

## 2013-07-18 MED ORDER — ONDANSETRON HCL 4 MG/2ML IJ SOLN
4.0000 mg | Freq: Four times a day (QID) | INTRAMUSCULAR | Status: DC | PRN
  Administered 2013-07-18 (×2): 4 mg via INTRAVENOUS
  Filled 2013-07-18 (×2): qty 2

## 2013-07-18 NOTE — H&P (Signed)
History and Physical  Karen [name] [id number] DOB: [birth year] DOA: 07/18/2013  Referring physician: EDP PCP: [name], MD  Outpatient Specialists:  1. Not known  Chief Complaint: Diarrhea  HPI: Karen [name] is a 68 y.o. female , works as a Chief Strategy Officer, PMH of hypertension, hyperlipidemia, arthritis, Pre diabetes, recently completed 5 days of antibiotic treatment for URI, presented to the ED with intractable diarrhea, intermittent vomiting and weakness. She states that she completed antibiotic treatment almost 10 days ago. Approximately 5 days ago, she started experiencing diarrhea for which she was seen in the ED on 07/15/13. She was assessed as possible viral gastroenteritis, treated with IV fluids and discharged home. Patient states that for the last 2-3 days, diarrhea has gotten significantly worse, 6-7 times during the day and 4-5 times at night, watery green, foul smelling, no blood or mucus, preceded by lower abdominal cramping, no fever or chills, decreased appetite, intermittent nausea and vomiting up to 2-3 times per day which is non-bloody/no coffee ground. She has progressively become weak and this morning while attempting to get up, felt dizzy and like she was going to pass out but did not fall or have loss of consciousness. Urine output had improved following IV fluids in the ED but since then has diminished. She states that she has only taken to doses of Motrin in the last 1 week for knee pain. She has continued to take her antihypertensive medications. In the ED, the patient was hypotensive in the 80s which improved after 2.5 L of normal saline bolus. Creatinine 3.4 which was normal at 0.98 on 07/15/13, WBC 30.4. Stool C. difficile PCR and GI pathogen panel PCR were sent. She was given a dose of Flagyl and hospitalist admission was requested.   Review of Systems: All systems reviewed and apart from history of presenting illness, are negative.  Past Medical History    Diagnosis Date  . Hypertension   . Hyperlipidemia   . Arthritis   . Prediabetes 2014    lifestyle    Past Surgical History  Procedure Laterality Date  . Ankle surgery    . Abdominal hysterectomy  1983 approx    fibroids, partial  . Fracture surgery Right mid 1990's    pin put in and waas removed  . Colonoscopy  04/27/2005    [id number] polyp at the splenic flexure cold biopsied/removed. Mid  descending polyp removed with cold snare technique. The remainder of the of the colon looked normal/ Normal rectum. inflammed adenomatous polyps.  . Colonoscopy N/A 05/26/2013    Procedure: COLONOSCOPY;  Surgeon: Daneil Dolin, MD;  Location: [facility];  Service: Endoscopy;  Laterality: N/A;  11:00AM   Social History:  reports that she has never smoked. She has never used smokeless tobacco. She reports that she does not drink alcohol or use illicit drugs. Married. Independent of activities of daily living.  Allergies  Allergen Reactions  . Penicillins Rash    Family History  Problem Relation Age of Onset  . Arthritis    . Diabetes      Prior to Admission medications   Medication Sig Start Date End Date Taking? Authorizing Provider  aspirin 81 MG tablet Take 81 mg by mouth daily.   Yes Historical Provider, MD  benzonatate (TESSALON) 100 MG capsule Take 1 capsule (100 mg total) by mouth 3 (three) times daily as needed for cough. 06/25/13  Yes Fayrene Helper, MD  Cholecalciferol (D3-1000) 1000 UNITS tablet Take 2,000  Units by mouth daily.    Yes Historical Provider, MD  Coenzyme Q10 (CO Q 10 PO) Take 1 tablet by mouth daily.    Yes Historical Provider, MD  diphenoxylate-atropine (LOMOTIL) 2.5-0.025 MG per tablet Take 1 tablet by mouth 4 (four) times daily as needed for diarrhea or loose stools. 07/15/13  Yes [name], MD  Ginger, Zingiber officinalis, (GINGER ROOT) 550 MG CAPS Take 1 capsule by mouth daily.    Yes Historical Provider, MD  Glucosamine-Chondroitin  (OSTEO BI-FLEX REGULAR STRENGTH) 250-200 MG TABS Take 1 tablet by mouth daily.   Yes Historical Provider, MD  ibuprofen (ADVIL,MOTRIN) 200 MG tablet Take 200 mg by mouth every 8 (eight) hours as needed.    Yes Historical Provider, MD  lisinopril-hydrochlorothiazide (PRINZIDE,ZESTORETIC) 20-25 MG per tablet Take 1 tablet by mouth daily. 02/20/13 02/20/14 Yes Fayrene Helper, MD  meclizine (ANTIVERT) 12.5 MG tablet Take 1 tablet (12.5 mg total) by mouth 3 (three) times daily as needed for dizziness. 07/17/13  Yes Fayrene Helper, MD  Omega-3 Fatty Acids (FISH OIL) 1200 MG CAPS Take 1 capsule by mouth daily.    Yes Historical Provider, MD  ondansetron (ZOFRAN) 4 MG tablet Take 1 tablet (4 mg total) by mouth daily as needed for nausea or vomiting. 07/15/13 07/15/14 Yes Fayrene Helper, MD  promethazine (PHENERGAN) 25 MG suppository Place 25 mg rectally every 6 (six) hours as needed for nausea or vomiting.   Yes Historical Provider, MD  simvastatin (ZOCOR) 20 MG tablet Take 20 mg by mouth at bedtime. 06/25/13 06/25/14 Yes Fayrene Helper, MD  temazepam (RESTORIL) 15 MG capsule Take 1 capsule (15 mg total) by mouth at bedtime as needed for sleep. 06/25/13 01/23/14 Yes Fayrene Helper, MD   Physical Exam: Filed Vitals:   07/18/13 1045 07/18/13 1107 07/18/13 1149 07/18/13 1334  BP: 95/41 97/35 109/42 115/49  Pulse: 100 95 94 108  Temp:    99.3 F (37.4 C)  TempSrc:    Oral  Resp: 18  18 19   Height:      Weight:      SpO2: 98%   97%     General exam: Moderately built and nourished pleasant middle-aged female patient, lying comfortably supine on the gurney in no obvious distress. Does not appear septic or toxic.  Head, eyes and ENT: Nontraumatic and normocephalic. Pupils equally reacting to light and accommodation. Oral mucosa dry.  Neck: Supple. No JVD, carotid bruit or thyromegaly.  Lymphatics: No lymphadenopathy.  Respiratory system: Clear to auscultation. No increased work of  breathing.  Cardiovascular system: S1 and S2 heard, RRR. No JVD, murmurs, gallops, clicks or pedal edema.  Gastrointestinal system: Abdomen is nondistended, soft. Mild tenderness in the epigastric and lower or drinks but no peritoneal signs. Normal bowel sounds heard. No organomegaly or masses appreciated.  Central nervous system: Alert and oriented. No focal neurological deficits.  Extremities: Symmetric 5 x 5 power. Peripheral pulses symmetrically felt.   Skin: No rashes or acute findings.  Musculoskeletal system: Negative exam.  Psychiatry: Pleasant and cooperative.   Labs on Admission:  Basic Metabolic Panel:  Recent Labs Lab 07/15/13 1802 07/18/13 0949  NA 135* 133*  K 3.3* 3.4*  CL 96 93*  CO2 26 23  GLUCOSE 108* 130*  BUN 22 31*  CREATININE 0.98 3.40*  CALCIUM 8.9 8.7   Liver Function Tests: No results found for this basename: AST, ALT, ALKPHOS, BILITOT, PROT, ALBUMIN,  in the last 168 hours No results  found for this basename: LIPASE, AMYLASE,  in the last 168 hours No results found for this basename: AMMONIA,  in the last 168 hours CBC:  Recent Labs Lab 07/15/13 1802 07/18/13 0949  WBC 13.7* 30.4*  NEUTROABS 10.6* 25.8*  HGB 11.7* 12.9  HCT 34.6* 36.6  MCV 89.2 87.4  PLT 401* 386   Cardiac Enzymes: No results found for this basename: CKTOTAL, CKMB, CKMBINDEX, TROPONINI,  in the last 168 hours  BNP (last 3 results) No results found for this basename: PROBNP,  in the last 8760 hours CBG: No results found for this basename: GLUCAP,  in the last 168 hours  Radiological Exams on Admission: No results found.  EKG: Independently reviewed. Sinus tachycardia at 113 beats per minute, normal axis and no acute changes  Assessment/Plan Principal Problem:   Diarrhea-? C. difficile Active Problems:   HYPERLIPIDEMIA   HYPERTENSION   Dehydration   Hypokalemia   Hypotension   Leukocytosis   Acute renal failure   1. Diarrhea and vomiting: Highly  suspicious for C. difficile infection. Follow up on C. difficile PCR and GI pathogen panel PCR. Contact isolation. Given her marked leukocytosis and elevated creatinine, start oral vancomycin x14 days. Advised to avoid antibiotics and PPIs unless absolutely necessary. Recommended probiotic yogurts on discharge. IV fluids, oral diet as tolerated and when necessary antiemetics. Monitor closely 2. Acute renal failure: Multifactorial secondary to diarrhea and dehydration, HCTZ and ACE inhibitors, possible ATN from hypotension and NSAIDs. Hold all carotid medications. Aggressive IV fluid hydration. Strict intake and output monitoring. Check urine sodium and creatinine. Trend daily creatinine and if it does not improve or worsens, consider renal ultrasound and nephrology consultation. 3. Dehydration with hyponatremia: Secondary to diarrhea and vomiting. IV fluid hydration. Oral intake as tolerated. 4. Hypokalemia: Replace in IV fluids and follow BMP. 5. Marked leukocytosis: Possibly from C. difficile. Treatment as above and follow CBCs. 6. Hypotension: Secondary to dehydration. Hold antihypertensives. Bolused with IV fluids in ED and has improved. Continue IV fluids. 7. Presyncope: Secondary to hypotension. Management as above.     Code Status: Full  Family Communication: Discussed with patient's spouse and son at bedside.  Disposition Plan: Home when medically stable.  Time spent: 82 minutes  Aubryanna Nesheim, MD, FACP, FHM. [facility] Pager [phone]  If 7PM-7AM, please contact night-coverage [email or web address] Password [email or web address] 07/18/2013, 2:01 PM

## 2013-07-18 NOTE — ED Notes (Signed)
Patient c/o lower abd pain, nausea, vomiting, and diarrhea. Denies any fevers. Patient reports generalized fatigue. Per patient treated here 2 days ago for same reason but states that "everything" has gotten progressively worse.

## 2013-07-18 NOTE — ED Provider Notes (Signed)
CSN: [id number]     Arrival date & time 07/18/13  0925 History  This chart was scribed for Sharyon Cable, MD by Elby Beck, ED Scribe. This patient was seen in room APA05/[facility]05 and the patient's care was started at 10:10 AM.   Chief Complaint  Patient presents with  . Abdominal Pain    Patient is a 68 y.o. female presenting with abdominal pain. The history is provided by the patient. No language interpreter was used.  Abdominal Pain Pain location: lower. Pain quality: cramping   Pain quality comment:  And "soreness" Pain radiates to:  Does not radiate Pain severity:  Moderate Onset quality:  Gradual Duration:  5 days Timing:  Intermittent Progression:  Worsening Chronicity:  New Context: not recent travel   Worsened by:  Nothing tried Ineffective treatments: Immodium and Phenergan. Associated symptoms: cough, diarrhea, fatigue, nausea and vomiting   Associated symptoms: no chest pain, no fever, no hematemesis, no hematochezia and no shortness of breath     HPI Comments: Karen [name] is a 67 y.o. female who presents to the Emergency Department complaining of persistent, daily episodes of non-bloody diarrhea over the past 5 days, with associated episodes of non-bloody emesis over the past 2 days. Pt reports that she was seen here for the same 2 days ago, and that all of her symptoms have persisted and progressively worsened since being seen. She also reports feeling associated fatigue and she suspects she is dehydrated. She states that she has tried a Phenergan suppository and Imodium without relief of her symptoms. She also reports associated gradually worsening "cramping" lower abdominal pain, as well as a mild cough recently. She denies any syncopal episodes, but states that she had lightheadedness and almost fell last night. She states that she was recently on a 5 day course of antibiotics for acute bronchitis. 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 She denies any recent travel to endemic areas. She denies chest pain, SOB, headache or any other symptoms. .   Past Medical History  Diagnosis Date  . Hypertension   . Hyperlipidemia   . Arthritis   . Prediabetes 2014    lifestyle    Past Surgical History  Procedure Laterality Date  . Ankle surgery    . Abdominal hysterectomy  1983 approx    fibroids, partial  . Fracture surgery Right mid 1990's    pin put in and waas removed  . Colonoscopy  04/27/2005    [id number] polyp at the splenic flexure cold biopsied/removed. Mid  descending polyp removed with cold snare technique. The remainder of the of the colon looked normal/ Normal rectum. inflammed adenomatous polyps.  . Colonoscopy N/A 05/26/2013    Procedure: COLONOSCOPY;  Surgeon: Daneil Dolin, MD;  Location: [facility];  Service: Endoscopy;  Laterality: N/A;  11:00AM   Family History  Problem Relation Age of Onset  . Arthritis    . Diabetes     History  Substance Use Topics  . Smoking status: Never Smoker   . Smokeless tobacco: Never Used  . Alcohol Use: No   OB History   Grav Para Term Preterm Abortions TAB SAB Ect Mult Living   4 3 3  1  1   3      Review of Systems  Constitutional: Positive for fatigue. Negative for fever.  Respiratory: Positive for cough. Negative for shortness of breath.   Cardiovascular: Negative for chest pain.  Gastrointestinal: Positive for nausea, vomiting,  abdominal pain and diarrhea. Negative for hematochezia and hematemesis.  Neurological: Positive for light-headedness. Negative for syncope and headaches.  All other systems reviewed and are negative.   Allergies  Penicillins  Home Medications   Current Outpatient Rx  Name  Route  Sig  Dispense  Refill  . aspirin 81 MG tablet   Oral   Take 81 mg by mouth daily.         . benzonatate (TESSALON) 100 MG capsule   Oral   Take 1 capsule (100 mg total) by mouth 3 (three) times daily as needed for cough.   21  capsule   0   . Cholecalciferol (D3-1000) 1000 UNITS tablet   Oral   Take 2,000 Units by mouth daily.          . Coenzyme Q10 (CO Q 10 PO)   Oral   Take 1 tablet by mouth daily.          . diphenoxylate-atropine (LOMOTIL) 2.5-0.025 MG per tablet   Oral   Take 1 tablet by mouth 4 (four) times daily as needed for diarrhea or loose stools.   15 tablet   0   . Ginger, Zingiber officinalis, (GINGER ROOT) 550 MG CAPS   Oral   Take 1 capsule by mouth daily.          . Glucosamine-Chondroitin (OSTEO BI-FLEX REGULAR STRENGTH) 250-200 MG TABS   Oral   Take 1 tablet by mouth daily.         Marland Kitchen ibuprofen (ADVIL,MOTRIN) 200 MG tablet   Oral   Take 200 mg by mouth every 8 (eight) hours as needed.          Marland Kitchen lisinopril-hydrochlorothiazide (PRINZIDE,ZESTORETIC) 20-25 MG per tablet   Oral   Take 1 tablet by mouth daily.   90 tablet   1   . meclizine (ANTIVERT) 12.5 MG tablet   Oral   Take 1 tablet (12.5 mg total) by mouth 3 (three) times daily as needed for dizziness.   12 tablet   0   . Omega-3 Fatty Acids (FISH OIL) 1200 MG CAPS   Oral   Take 1 capsule by mouth daily.          . ondansetron (ZOFRAN) 4 MG tablet   Oral   Take 1 tablet (4 mg total) by mouth daily as needed for nausea or vomiting.   6 tablet   0   . promethazine (PHENERGAN) 25 MG suppository   Rectal   Place 25 mg rectally every 6 (six) hours as needed for nausea or vomiting.         . simvastatin (ZOCOR) 20 MG tablet   Oral   Take 20 mg by mouth at bedtime.         . temazepam (RESTORIL) 15 MG capsule   Oral   Take 1 capsule (15 mg total) by mouth at bedtime as needed for sleep.   30 capsule   5    Triage Vitals: BP 99/51  Pulse 118  Temp(Src) 99.5 F (37.5 C) (Oral)  Resp 18  Ht 5' 6.5" (1.689 m)  Wt 185 lb (83.915 kg)  BMI 29.42 kg/m2  SpO2 97% BP 109/42  Pulse 94  Temp(Src) 99.5 F (37.5 C) (Oral)  Resp 18  Ht 5' 6.5" (1.689 m)  Wt 185 lb (83.915 kg)  BMI 29.42 kg/m2   SpO2 98%  Physical Exam  Nursing note and vitals reviewed. CONSTITUTIONAL: Well developed/well nourished HEAD: Normocephalic/atraumatic EYES: EOMI/PERRL ENMT:  Mucous membranes dry NECK: supple no meningeal signs SPINE:entire spine nontender CV: S1/S2 noted, no murmurs/rubs/gallops noted LUNGS: Lungs are clear to auscultation bilaterally, no apparent distress ABDOMEN: soft, diffuse, moderate lower abdominal tenderness, no rebound or guarding GU:no cva tenderness NEURO: Pt is awake/alert, moves all extremitiesx4 EXTREMITIES: pulses normal, full ROM SKIN: warm, color normal PSYCH: no abnormalities of mood noted   ED Course  Procedures (including critical care time)  DIAGNOSTIC STUDIES: Oxygen Saturation is 97% on RA, normal by my interpretation.    COORDINATION OF CARE: 10:18 AM- Discussed plan to obtain diagnostic lab work and radiology. Will also order IV fluids and Zofran.Pt advised of plan for treatment and pt agrees. 12:11 PM Repeat exam reveals improved abdominal exam and her vitals are improved (she was orthostatic) Will defer need for CT imaging since pain improved Suspect cdif colitis causing diarrhea and now with acute renal failure D/w dr Algis Liming requests admit and to start flagyl for cdif  Medications  sodium chloride 0.9 % bolus 1,000 mL (1,000 mLs Intravenous New Bag/Given 07/18/13 1045)  sodium chloride 0.9 % bolus 1,000 mL (0 mLs Intravenous Stopped 07/18/13 1045)  ondansetron (ZOFRAN) injection 4 mg (4 mg Intravenous Given 07/18/13 1011)   Labs Review Labs Reviewed  CBC WITH DIFFERENTIAL - Abnormal; Notable for the following:    WBC 30.4 (*)    Neutrophils Relative % 85 (*)    Neutro Abs 25.8 (*)    Lymphocytes Relative 4 (*)    Monocytes Absolute 3.4 (*)    All other components within normal limits  BASIC METABOLIC PANEL - Abnormal; Notable for the following:    Sodium 133 (*)    Potassium 3.4 (*)    Chloride 93 (*)    Glucose, Bld 130 (*)    BUN 31 (*)     Creatinine, Ser 3.40 (*)    GFR calc non Af Amer 13 (*)    GFR calc Af Amer 15 (*)    All other components within normal limits  STOOL CULTURE  CLOSTRIDIUM DIFFICILE BY PCR  CG4 I-STAT (LACTIC ACID)   Imaging Review No results found.  EKG Interpretation    Date/Time:  Friday July 18 2013 09:42:03 EST Ventricular Rate:  113 PR Interval:  148 QRS Duration: 84 QT Interval:  312 QTC Calculation: 427 R Axis:   -8 Text Interpretation:  Sinus tachycardia Possible Left atrial enlargement Borderline ECG When compared with ECG of 15-Jul-2013 17:42, No significant change was found Confirmed by Christy Gentles  MD, Dillinger Aston [phone]) on 07/18/2013 11:16:20 AM            MDM  No diagnosis found.    Nursing notes including past medical history and social history reviewed and considered in documentation Labs/vital reviewed and considered Previous records reviewed and considered   I personally performed the services described in this documentation, which was scribed in my presence. The recorded information has been reviewed and is accurate.      Sharyon Cable, MD 07/18/13 [phone]

## 2013-07-18 NOTE — Progress Notes (Signed)
Utilization Review Complete  

## 2013-07-19 ENCOUNTER — Telehealth: Payer: Self-pay | Admitting: Family Medicine

## 2013-07-19 DIAGNOSIS — A0472 Enterocolitis due to Clostridium difficile, not specified as recurrent: Secondary | ICD-10-CM | POA: Diagnosis present

## 2013-07-19 DIAGNOSIS — E876 Hypokalemia: Secondary | ICD-10-CM

## 2013-07-19 LAB — BASIC METABOLIC PANEL
BUN: 29 mg/dL — AB (ref 6–23)
CO2: 22 mEq/L (ref 19–32)
CREATININE: 1.03 mg/dL (ref 0.50–1.10)
Calcium: 8.3 mg/dL — ABNORMAL LOW (ref 8.4–10.5)
Chloride: 102 mEq/L (ref 96–112)
GFR, EST AFRICAN AMERICAN: 65 mL/min — AB (ref >90)
GFR, EST NON AFRICAN AMERICAN: 56 mL/min — AB (ref >90)
Glucose, Bld: 116 mg/dL — ABNORMAL HIGH (ref 70–99)
Potassium: 3.8 mEq/L (ref 3.7–5.3)
Sodium: 135 mEq/L — ABNORMAL LOW (ref 137–147)

## 2013-07-19 LAB — CBC
HEMATOCRIT: 37.1 % (ref 36.0–46.0)
Hemoglobin: 13 g/dL (ref 12.0–15.0)
MCH: 30.8 pg (ref 26.0–34.0)
MCHC: 35 g/dL (ref 30.0–36.0)
MCV: 87.9 fL (ref 78.0–100.0)
Platelets: 427 10*3/uL — ABNORMAL HIGH (ref 150–400)
RBC: 4.22 MIL/uL (ref 3.87–5.11)
RDW: 13.2 % (ref 11.5–15.5)
WBC: 34.9 10*3/uL — ABNORMAL HIGH (ref 4.0–10.5)

## 2013-07-19 LAB — SODIUM, URINE, RANDOM: SODIUM UR: 20 meq/L

## 2013-07-19 LAB — CREATININE, URINE, RANDOM: Creatinine, Urine: 230.7 mg/dL

## 2013-07-19 MED ORDER — ENOXAPARIN SODIUM 40 MG/0.4ML ~~LOC~~ SOLN: 40.0000 mg | Freq: Every day | SUBCUTANEOUS | Status: DC

## 2013-07-19 NOTE — Progress Notes (Signed)
PROGRESS NOTE    Karen [name] [id number] DOB: [birth year] DOA: 07/18/2013 PCP: [name], MD  HPI/Brief narrative 68 y.o. female , works as a Chief Strategy Officer, PMH of hypertension, hyperlipidemia, arthritis, Pre diabetes, recently completed 5 days of antibiotic treatment for URI, presented to the ED with intractable diarrhea, intermittent vomiting and weakness. In the ED, the patient was hypotensive in the 80s which improved after 2.5 L of normal saline bolus. Creatinine 3.4 which was normal at 0.98 on 07/15/13, WBC 30.4. She was admitted for management of C. difficile diarrhea and acute renal failure.  Assessment/Plan:  C. difficile colitis - Patient advised to avoid unnecessary antibiotics or PPIs. Encouraged probiotic yogurt is upon discharge. She was started empirically on oral vancomycin. Clinically improving. Complete 14 days treatment. Case management to check on Monday regarding insurance coverage for oral vancomycin and cost to patient.  Acute renal failure - Likely prerenal from dehydration. FENA < 1%. Aggressively hydrated with IV fluids. Seems to have resolved. Follow BMP in a.m. Reduce IV fluids. Continue to hold diuretics and ACE inhibitors.  Dehydration with hyponatremia - Improved after IV fluid hydration. Encourage by mouth intake. Reduce IV fluids.  Hypokalemia - Replaced  Marked leukocytosis - Secondary to C. difficile colitis. Follow CBC's  Hypertension - Admitted with hypotension and blood pressure medications are held. Blood pressures have improved. Consider resuming tomorrow.  Presyncope - Secondary to dehydration and hypotension. No orthostatic changes this morning.     Code Status: Full code Family Communication: Discussed with son at bedside. Disposition Plan: Home when medically stable   Consultants:  None  Procedures:  None  Antibiotics:  Oral vancomycin 2/6 >   Subjective: Decreased frequency of diarrhea. Some dry heaving  yesterday but no vomiting since. Tolerating diet. Good urine output.  Objective: Filed Vitals:   07/19/13 0616 07/19/13 1040 07/19/13 1042 07/19/13 1044  BP: 125/62 126/82 124/79 134/84  Pulse: 106 100 109 113  Temp: 98.8 F (37.1 C)     TempSrc: Oral     Resp: 16     Height:      Weight:      SpO2: 97%       Intake/Output Summary (Last 24 hours) at 07/19/13 1643 Last data filed at 07/19/13 0500  Gross per 24 hour  Intake    240 ml  Output    300 ml  Net    -60 ml   Filed Weights   07/18/13 0933 07/19/13 0500  Weight: 83.915 kg (185 lb) 95.3 kg (210 lb 1.6 oz)     Exam:  General exam: Moderately built and nourished middle-aged female lying comfortably in bed. Looks better than she did on admission. Respiratory system: Clear. No increased work of breathing. Cardiovascular system: S1 & S2 heard, RRR. No JVD, murmurs, gallops, clicks or pedal edema. Telemetry: Sinus tachycardia in the 100-110s. Gastrointestinal system: Abdomen is nondistended, soft. Diffuse minimal tenderness but without peritoneal signs. Normal bowel sounds heard. Central nervous system: Alert and oriented. No focal neurological deficits. Extremities: Symmetric 5 x 5 power.   Data Reviewed: Basic Metabolic Panel:  Recent Labs Lab 07/15/13 1802 07/18/13 0949 07/19/13 0500  NA 135* 133* 135*  K 3.3* 3.4* 3.8  CL 96 93* 102  CO2 26 23 22   GLUCOSE 108* 130* 116*  BUN 22 31* 29*  CREATININE 0.98 3.40* 1.03  CALCIUM 8.9 8.7 8.3*   Liver Function Tests: No results found for this basename: AST, ALT, ALKPHOS, BILITOT, PROT, ALBUMIN,  in the last 168 hours No results found for this basename: LIPASE, AMYLASE,  in the last 168 hours No results found for this basename: AMMONIA,  in the last 168 hours CBC:  Recent Labs Lab 07/15/13 1802 07/18/13 0949 07/19/13 0500  WBC 13.7* 30.4* 34.9*  NEUTROABS 10.6* 25.8*  --   HGB 11.7* 12.9 13.0  HCT 34.6* 36.6 37.1  MCV 89.2 87.4 87.9  PLT 401* 386 427*     Cardiac Enzymes: No results found for this basename: CKTOTAL, CKMB, CKMBINDEX, TROPONINI,  in the last 168 hours BNP (last 3 results) No results found for this basename: PROBNP,  in the last 8760 hours CBG: No results found for this basename: GLUCAP,  in the last 168 hours  Recent Results (from the past 240 hour(s))  URINE CULTURE     Status: None   Collection Time    07/15/13  6:56 PM      Result Value Range Status   Specimen Description URINE, CLEAN CATCH   Final   Special Requests NONE   Final   Culture  Setup Time     Final   Value: 07/15/2013 21:45     Performed at [facility]     Final   Value: 30,000 COLONIES/ML     Performed at [facility]   Culture     Final   Value: Multiple bacterial morphotypes present, none predominant. Suggest appropriate recollection if clinically indicated.     Performed at [facility]   Report Status 07/17/2013 FINAL   Final  CLOSTRIDIUM DIFFICILE BY PCR     Status: Abnormal   Collection Time    07/18/13  1:08 PM      Result Value Range Status   C difficile by pcr POSITIVE (*) NEGATIVE Final   Comment: CRITICAL RESULT CALLED TO, READ BACK BY AND VERIFIED WITH:     HOLT,J ON 07/18/13 AT 1445 BY LOY,C       Studies: No results found.      Scheduled Meds: . aspirin EC  81 mg Oral Daily  . cholecalciferol  2,000 Units Oral Daily  . [START ON 07/20/2013] enoxaparin (LOVENOX) injection  40 mg Subcutaneous Daily  . simvastatin  20 mg Oral QHS  . sodium chloride  3 mL Intravenous Q12H  . vancomycin  125 mg Oral QID   Continuous Infusions: . 0.9 % NaCl with KCl 20 mEq / L 100 mL/hr at 07/19/13 1225    Principal Problem:   Diarrhea-? C. difficile Active Problems:   HYPERLIPIDEMIA   HYPERTENSION   Dehydration   Hypokalemia   Hypotension   Leukocytosis   Acute renal failure    Time spent: 40 minutes    Yehonatan Grandison, MD, FACP, FHM. [facility] Pager [phone]  If 7PM-7AM,  please contact night-coverage [email or web address] Password [facility] 07/19/2013, 4:43 PM    LOS: 1 day

## 2013-07-19 NOTE — Telephone Encounter (Signed)
Call came in from daughter stating her Mom was doing better, though still a lot of  Nausea Also let me know that the pt she had been taking care of , Karen [name], in room 111 at [facility], had recently had loose stool. I called the center and made them aware of the possible c diff contact so that if the pt in the center had loose stool she should be tested for C diff. I spoke to "Estill Bamberg, who identified herself as the caregiver /nurse for the pt, Karen [name], and said that she understood the reason that I called

## 2013-07-20 DIAGNOSIS — I10 Essential (primary) hypertension: Secondary | ICD-10-CM

## 2013-07-20 LAB — CBC
HCT: 36.8 % (ref 36.0–46.0)
Hemoglobin: 12.6 g/dL (ref 12.0–15.0)
MCH: 30.3 pg (ref 26.0–34.0)
MCHC: 34.2 g/dL (ref 30.0–36.0)
MCV: 88.5 fL (ref 78.0–100.0)
PLATELETS: 435 10*3/uL — AB (ref 150–400)
RBC: 4.16 MIL/uL (ref 3.87–5.11)
RDW: 13.1 % (ref 11.5–15.5)
WBC: 33.9 10*3/uL — AB (ref 4.0–10.5)

## 2013-07-20 LAB — BASIC METABOLIC PANEL
BUN: 22 mg/dL (ref 6–23)
CALCIUM: 8.4 mg/dL (ref 8.4–10.5)
CO2: 24 meq/L (ref 19–32)
Chloride: 103 mEq/L (ref 96–112)
Creatinine, Ser: 0.94 mg/dL (ref 0.50–1.10)
GFR calc Af Amer: 72 mL/min — ABNORMAL LOW (ref >90)
GFR calc non Af Amer: 62 mL/min — ABNORMAL LOW (ref >90)
Glucose, Bld: 151 mg/dL — ABNORMAL HIGH (ref 70–99)
Potassium: 4.4 mEq/L (ref 3.7–5.3)
SODIUM: 137 meq/L (ref 137–147)

## 2013-07-20 NOTE — Progress Notes (Signed)
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 In the ED, the patient was hypotensive in the 80s which improved after 2.5 L of normal saline bolus. Creatinine 3.4 which was normal at 0.98 on 07/15/13, WBC 30.4. She was admitted for management of C. difficile diarrhea and acute renal failure.  Assessment/Plan:  C. difficile colitis - Patient advised to avoid unnecessary antibiotics or PPIs. Encouraged probiotic yogurt upon discharge. She was started empirically on oral vancomycin. Clinically improving. Complete 14 days treatment. Case management to check on Monday regarding insurance coverage for oral vancomycin and cost to patient. - On admission, patient denied sickly contacts with diarrhea. 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 As per chart review, PCP contacted [facility] and alerted staff to monitor/evaluate if that patient had symptoms.  Acute renal failure - Likely prerenal from dehydration. FENA < 1%. Aggressively hydrated with IV fluids. Resolved. Continue to hold diuretics and ACE inhibitors. DC IVF.  Dehydration with hyponatremia - Resolved after IVF  Hypokalemia - Replaced  Marked leukocytosis - Secondary to C. difficile colitis. Still significantly elevated at 33K but patient clinically much better and does not appear septic or toxic.  Hypertension - Admitted with hypotension & AKI and blood pressure medications were held. Blood pressures starting to creep up. Monitor and may need to hold off on HCTZ/ACEI for couple of more days due to recent AKI. Will consider starting low dose Amlodipine if  needed.  Presyncope - Secondary to dehydration and hypotension. No orthostatic changes this morning.     Code Status: Full code Family Communication: None at bedside.. Disposition Plan: Home when medically stable   Consultants:  None  Procedures:  None  Antibiotics:  Oral vancomycin 2/6 >   Subjective: Decreasing frequency of diarrhea but had transient worsening overnight. Stool consistency is more solid. Nausea but no vomiting. Tolerating diet. Some abdominal cramps. No dizziness or lightheadedness.  Objective: Filed Vitals:   07/19/13 2300 07/20/13 0620 07/20/13 0625 07/20/13 0631  BP: 159/75 142/68 144/68 143/72  Pulse: 110 100 103 106  Temp: 98.4 F (36.9 C) 98.3 F (36.8 C)    TempSrc: Oral Oral    Resp: 20 16 18 20   Height:      Weight:    97.3 kg (214 lb 8.1 oz)  SpO2: 97% 97% 100% 100%    Intake/Output Summary (Last 24 hours) at 07/20/13 1441 Last data filed at 07/20/13 0531  Gross per 24 hour  Intake 1726.67 ml  Output      0 ml  Net 1726.67 ml   Filed Weights   07/18/13 0933 07/19/13 0500 07/20/13 0631  Weight: 83.915 kg (185 lb) 95.3 kg (210 lb 1.6 oz) 97.3 kg (214 lb 8.1 oz)     Exam:  General exam: Moderately built and nourished middle-aged female ambulating comfortably in the room. Respiratory system: Clear. No increased work of breathing. Cardiovascular system: S1 & S2 heard, RRR. No JVD, murmurs, gallops, clicks or pedal edema. Telemetry: Sinus tachycardia in the 100s. Gastrointestinal system: Abdomen is nondistended, soft. Nontender. Normal bowel sounds heard. Central nervous system: Alert and oriented. No focal neurological deficits. Extremities: Symmetric 5  x 5 power.   Data Reviewed: Basic Metabolic Panel:  Recent Labs Lab 07/15/13 1802 07/18/13 0949 07/19/13 0500 07/20/13 0523  NA 135* 133* 135* 137  K 3.3* 3.4* 3.8 4.4  CL 96 93* 102 103  CO2 26 23 22 24   GLUCOSE 108* 130* 116* 151*  BUN 22 31* 29* 22  CREATININE  0.98 3.40* 1.03 0.94  CALCIUM 8.9 8.7 8.3* 8.4   Liver Function Tests: No results found for this basename: AST, ALT, ALKPHOS, BILITOT, PROT, ALBUMIN,  in the last 168 hours No results found for this basename: LIPASE, AMYLASE,  in the last 168 hours No results found for this basename: AMMONIA,  in the last 168 hours CBC:  Recent Labs Lab 07/15/13 1802 07/18/13 0949 07/19/13 0500 07/20/13 0523  WBC 13.7* 30.4* 34.9* 33.9*  NEUTROABS 10.6* 25.8*  --   --   HGB 11.7* 12.9 13.0 12.6  HCT 34.6* 36.6 37.1 36.8  MCV 89.2 87.4 87.9 88.5  PLT 401* 386 427* 435*   Cardiac Enzymes: No results found for this basename: CKTOTAL, CKMB, CKMBINDEX, TROPONINI,  in the last 168 hours BNP (last 3 results) No results found for this basename: PROBNP,  in the last 8760 hours CBG: No results found for this basename: GLUCAP,  in the last 168 hours  Recent Results (from the past 240 hour(s))  URINE CULTURE     Status: None   Collection Time    07/15/13  6:56 PM      Result Value Range Status   Specimen Description URINE, CLEAN CATCH   Final   Special Requests NONE   Final   Culture  Setup Time     Final   Value: 07/15/2013 21:45     Performed at [place]     Final   Value: 30,000 COLONIES/ML     Performed at [facility]   Culture     Final   Value: Multiple bacterial morphotypes present, none predominant. Suggest appropriate recollection if clinically indicated.     Performed at [facility]   Report Status 07/17/2013 FINAL   Final  STOOL CULTURE     Status: None   Collection Time    07/18/13  1:08 PM      Result Value Range Status   Specimen Description STOOL   Final   Special Requests NONE   Final   Culture     Final   Value: NO SUSPICIOUS COLONIES, CONTINUING TO HOLD     Performed at [facility]   Report Status PENDING   Incomplete  CLOSTRIDIUM DIFFICILE BY PCR     Status: Abnormal   Collection Time    07/18/13  1:08 PM       Result Value Range Status   C difficile by pcr POSITIVE (*) NEGATIVE Final   Comment: CRITICAL RESULT CALLED TO, READ BACK BY AND VERIFIED WITH:     HOLT,J ON 07/18/13 AT 1445 BY LOY,C       Studies: No results found.      Scheduled Meds: . aspirin EC  81 mg Oral Daily  . cholecalciferol  2,000 Units Oral Daily  . simvastatin  20 mg Oral QHS  . sodium chloride  3 mL Intravenous Q12H  . vancomycin  125 mg Oral QID   Continuous Infusions: . 0.9 % NaCl with KCl 20 mEq / L 100 mL/hr at 07/20/13 1155    Principal Problem:   C. difficile  colitis Active Problems:   HYPERLIPIDEMIA   HYPERTENSION   Dehydration   Hypokalemia   Hypotension   Leukocytosis   Acute renal failure    Time spent: 52 minutes    Aman Batley, MD, FACP, FHM. [facility] Pager [phone]  If 7PM-7AM, please contact night-coverage [email or web address] Password [email or web address] 07/20/2013, 2:41 PM    LOS: 2 days

## 2013-07-21 LAB — CBC
HCT: 37.2 % (ref 36.0–46.0)
Hemoglobin: 12.8 g/dL (ref 12.0–15.0)
MCH: 30.1 pg (ref 26.0–34.0)
MCHC: 34.4 g/dL (ref 30.0–36.0)
MCV: 87.5 fL (ref 78.0–100.0)
Platelets: 455 10*3/uL — ABNORMAL HIGH (ref 150–400)
RBC: 4.25 MIL/uL (ref 3.87–5.11)
RDW: 13.2 % (ref 11.5–15.5)
WBC: 30.7 10*3/uL — ABNORMAL HIGH (ref 4.0–10.5)

## 2013-07-21 LAB — GI PATHOGEN PANEL BY PCR, STOOL
C difficile toxin A/B: POSITIVE
CRYPTOSPORIDIUM BY PCR: NEGATIVE
Campylobacter by PCR: NEGATIVE
E COLI (ETEC) LT/ST: NEGATIVE
E COLI (STEC): NEGATIVE
E coli 0157 by PCR: NEGATIVE
G lamblia by PCR: NEGATIVE
Norovirus GI/GII: NEGATIVE
Rotavirus A by PCR: NEGATIVE
SHIGELLA BY PCR: NEGATIVE
Salmonella by PCR: NEGATIVE

## 2013-07-21 MED ORDER — VANCOMYCIN 50 MG/ML ORAL SOLUTION: 125.0000 mg | Freq: Four times a day (QID) | ORAL | Status: DC

## 2013-07-21 NOTE — Care Management Note (Signed)
    Page 1 of 1   07/21/2013     3:43:33 PM   CARE MANAGEMENT NOTE 07/21/2013  Patient:  Karen [name], Karen [name]   Account Number:  [id number]  Date Initiated:  07/21/2013  Documentation initiated by:  Claretha Cooper  Subjective/Objective Assessment:   Pt admitted from home. To be DC'd on Vancomycin oral solution. Dr. Wandra Mannan Rx to [place]. CM spoke to Janit Bern PhD who stated the cost would be $36. Pt aware and will ask her son to pick it up at DC since he works in [place].     Action/Plan:   Anticipated DC Date:  07/22/2013   Anticipated DC Plan:  [place]  CM consult      Choice offered to / List presented to:             Status of service:  Completed, signed off Medicare Important Message given?   (If response is "NO", the following Medicare IM given date fields will be blank) Date Medicare IM given:   Date Additional Medicare IM given:    Discharge Disposition:    Per UR Regulation:    If discussed at Long Length of Stay Meetings, dates discussed:    Comments:  07/21/13 Claretha Cooper RN BNS CM

## 2013-07-21 NOTE — Progress Notes (Signed)
PROGRESS NOTE    Karen [name] JQB:[id number] DOB: 1945-08-25 DOA: 07/18/2013 PCP: [name], MD  HPI/Brief narrative 68 y.o. female , works as a Chief Strategy Officer, PMH of hypertension, hyperlipidemia, arthritis, Pre diabetes, recently completed 5 days of antibiotic treatment for URI, presented to the ED with intractable diarrhea, intermittent vomiting and weakness. In the ED, the patient was hypotensive in the 80s which improved after 2.5 L of normal saline bolus. Creatinine 3.4 which was normal at 0.98 on 07/15/13, WBC 30.4. She was admitted for management of C. difficile diarrhea and acute renal failure.  Assessment/Plan:  C. difficile colitis - Patient advised to avoid unnecessary antibiotics or PPIs. Encouraged probiotic yogurt upon discharge. She was started empirically on oral vancomycin. Clinically improving. Complete 14 days treatment.  - On admission, patient denied sickly contacts with diarrhea. 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 As per chart review, PCP contacted [facility] and alerted staff to monitor/evaluate if that patient had symptoms. - Continues to improve. Case manager able to assist with oral vancomycin at discharge.  Acute renal failure - Likely prerenal from dehydration. FENA < 1%. Aggressively hydrated with IV fluids. Resolved. Continue to hold diuretics and ACE inhibitors. DC IVF.  Dehydration with hyponatremia - Resolved after IVF  Hypokalemia - Replaced  Marked leukocytosis - Secondary to C. difficile colitis. Slowly starting to improve but still 31K but patient clinically much better and does not appear septic or toxic.  Hypertension - Admitted with hypotension & AKI and blood pressure medications were held. Blood pressures starting to creep up. Monitor and may need to hold off on HCTZ/ACEI for couple of more days due to recent AKI. Will consider starting low dose Amlodipine if  needed.  Presyncope - Secondary to dehydration and hypotension. No orthostatic changes this morning.     Code Status: Full code Family Communication: None at bedside.. Disposition Plan: Home possibly 2/10   Consultants:  None  Procedures:  None  Antibiotics:  Oral vancomycin 2/6 >   Subjective: No nausea or vomiting. Appetite definitely improving. Frequency of stools continuing to decrease and consistency continues to improve. Denies abdominal pain.  Objective: Filed Vitals:   07/20/13 2300 07/21/13 0640 07/21/13 0645 07/21/13 0650  BP: 163/75 144/71 141/78 145/82  Pulse: 102 99 105 111  Temp: 97.8 F (36.6 C) 98 F (36.7 C)    TempSrc: Oral Oral    Resp: 20 20 20 18   Height:      Weight:    97 kg (213 lb 13.5 oz)  SpO2: 99% 98% 99% 99%    Intake/Output Summary (Last 24 hours) at 07/21/13 1345 Last data filed at 07/21/13 1300  Gross per 24 hour  Intake    246 ml  Output      0 ml  Net    246 ml   Filed Weights   07/19/13 0500 07/20/13 0631 07/21/13 0650  Weight: 95.3 kg (210 lb 1.6 oz) 97.3 kg (214 lb 8.1 oz) 97 kg (213 lb 13.5 oz)     Exam:  General exam: Moderately built and nourished middle-lying comfortably in bed. Respiratory system: Clear. No increased work of breathing. Cardiovascular system: S1 & S2 heard, RRR. No JVD, murmurs, gallops, clicks or pedal edema.  Gastrointestinal system: Abdomen is nondistended, soft. Nontender. Normal bowel sounds heard. Central nervous system: Alert and oriented. No focal neurological deficits. Extremities: Symmetric 5 x 5 power.   Data  Reviewed: Basic Metabolic Panel:  Recent Labs Lab 07/15/13 1802 07/18/13 0949 07/19/13 0500 07/20/13 0523  NA 135* 133* 135* 137  K 3.3* 3.4* 3.8 4.4  CL 96 93* 102 103  CO2 26 23 22 24   GLUCOSE 108* 130* 116* 151*  BUN 22 31* 29* 22  CREATININE 0.98 3.40* 1.03 0.94  CALCIUM 8.9 8.7 8.3* 8.4   Liver Function Tests: No results found for this basename: AST, ALT,  ALKPHOS, BILITOT, PROT, ALBUMIN,  in the last 168 hours No results found for this basename: LIPASE, AMYLASE,  in the last 168 hours No results found for this basename: AMMONIA,  in the last 168 hours CBC:  Recent Labs Lab 07/15/13 1802 07/18/13 0949 07/19/13 0500 07/20/13 0523 07/21/13 0608  WBC 13.7* 30.4* 34.9* 33.9* 30.7*  NEUTROABS 10.6* 25.8*  --   --   --   HGB 11.7* 12.9 13.0 12.6 12.8  HCT 34.6* 36.6 37.1 36.8 37.2  MCV 89.2 87.4 87.9 88.5 87.5  PLT 401* 386 427* 435* 455*   Cardiac Enzymes: No results found for this basename: CKTOTAL, CKMB, CKMBINDEX, TROPONINI,  in the last 168 hours BNP (last 3 results) No results found for this basename: PROBNP,  in the last 8760 hours CBG: No results found for this basename: GLUCAP,  in the last 168 hours  Recent Results (from the past 240 hour(s))  URINE CULTURE     Status: None   Collection Time    07/15/13  6:56 PM      Result Value Range Status   Specimen Description URINE, CLEAN CATCH   Final   Special Requests NONE   Final   Culture  Setup Time     Final   Value: 07/15/2013 21:45     Performed at [facility]     Final   Value: 30,000 COLONIES/ML     Performed at [facility]   Culture     Final   Value: Multiple bacterial morphotypes present, none predominant. Suggest appropriate recollection if clinically indicated.     Performed at [facility]   Report Status 07/17/2013 FINAL   Final  STOOL CULTURE     Status: None   Collection Time    07/18/13  1:08 PM      Result Value Range Status   Specimen Description STOOL   Final   Special Requests NONE   Final   Culture     Final   Value: NO SUSPICIOUS COLONIES, CONTINUING TO HOLD     Performed at [facility]   Report Status PENDING   Incomplete  CLOSTRIDIUM DIFFICILE BY PCR     Status: Abnormal   Collection Time    07/18/13  1:08 PM      Result Value Range Status   C difficile by pcr POSITIVE (*) NEGATIVE Final    Comment: CRITICAL RESULT CALLED TO, READ BACK BY AND VERIFIED WITH:     HOLT,J ON 07/18/13 AT 1445 BY LOY,C       Studies: No results found.      Scheduled Meds: . aspirin EC  81 mg Oral Daily  . cholecalciferol  2,000 Units Oral Daily  . simvastatin  20 mg Oral QHS  . sodium chloride  3 mL Intravenous Q12H  . vancomycin  125 mg Oral QID   Continuous Infusions:    Principal Problem:   C. difficile colitis Active Problems:   HYPERLIPIDEMIA   HYPERTENSION  Dehydration   Hypokalemia   Hypotension   Leukocytosis   Acute renal failure    Time spent: 20 minutes    Muntaha Vermette, MD, FACP, FHM. [facility] Pager [phone]  If 7PM-7AM, please contact night-coverage [email or web address] Password [email or web address] 07/21/2013, 1:45 PM    LOS: 3 days

## 2013-07-22 LAB — STOOL CULTURE

## 2013-07-22 LAB — CBC
HEMATOCRIT: 36.6 % (ref 36.0–46.0)
Hemoglobin: 12.6 g/dL (ref 12.0–15.0)
MCH: 30.1 pg (ref 26.0–34.0)
MCHC: 34.4 g/dL (ref 30.0–36.0)
MCV: 87.6 fL (ref 78.0–100.0)
Platelets: 424 10*3/uL — ABNORMAL HIGH (ref 150–400)
RBC: 4.18 MIL/uL (ref 3.87–5.11)
RDW: 13.2 % (ref 11.5–15.5)
WBC: 25.6 10*3/uL — AB (ref 4.0–10.5)

## 2013-07-22 NOTE — Discharge Summary (Signed)
Physician Discharge Summary  Karen [name] [id number] DOB: 08/03/1945 DOA: 07/18/2013  PCP: [name], MD  Admit date: 07/18/2013 Discharge date: 07/22/2013  Recommendations for Outpatient Follow-up:  1. Resolution of C. difficile colitis. Consider repeat CBC as outpatient to follow-up WBC.  Follow-up Information   Follow up with [name], MD. Schedule an appointment as soon as possible for a visit in 1 week.   Specialty:  Family Medicine   Contact information:   [address] [place] [address] [phone]      Discharge Diagnoses:  1. C. difficile colitis first episode 2. Acute renal failure resolved 3. Dehydration with hyponatremia 4. Hypotension on admission secondary to dehydration  Discharge Condition: Improved Disposition: Home  Diet recommendation: Heart healthy  Filed Weights   07/20/13 0631 07/21/13 0650 07/22/13 0625  Weight: 97.3 kg (214 lb 8.1 oz) 97 kg (213 lb 13.5 oz) 96.7 kg (213 lb 3 oz)    History of present illness:  68 year old woman present with nausea, vomiting and diarrhea in the context of recent antibiotic use. In the emergency department she was hypotensive, creatinine 3.4, WBC 30.4. Admitted for C. difficile colitis and acute renal failure.  Hospital Course:  [name] was started on oral vancomycin and experienced rapid clinical improvement. She has remained afebrile, is tolerating a diet, has no abdominal pain, diarrhea is manageable and she is stable for discharge. Leukocytosis is slowly trending downwards. Acute renal failure quickly resolved with fluids and temporary withholding of antihypertensives.  1. C. difficile colitis, first episode. Rapidly improving clinically. Leukocytosis now trending downwards. Remains afebrile, without pain, tolerating diet. 2. Acute renal failure resolved secondary to diarrhea, dehydration, complicated by hydrochlorothiazide and ACE inhibitor.  3. Dehydration with  hyponatremia. Resolved with fluids. 4. Hypotension on admission, resolved. Likely secondary to dehydration. Continue oral vancomycin. Avoid antibiotics, PPIs unless absolutely necessary.  Consultants: none Procedures: none Antibiotics:  Oral vancomycin 2/6 >> 2/19  Discharge Instructions  Discharge Orders   Future Appointments Provider Department Dept Phone   10/23/2013 8:00 AM Fayrene Helper, MD [facility] [phone]   Future Orders Complete By Expires   Activity as tolerated - No restrictions  As directed    Diet - low sodium heart healthy  As directed    Discharge instructions  As directed    Comments:     Be sure to take vancomycin until prescription is finished. Call your physician or seek immediate medical attention for fever, abdominal pain, vomiting, worsening diarrhea or worsening of condition.       Medication List    STOP taking these medications       diphenoxylate-atropine 2.5-0.025 MG per tablet  Commonly known as:  LOMOTIL     ibuprofen 200 MG tablet  Commonly known as:  ADVIL,MOTRIN     meclizine 12.5 MG tablet  Commonly known as:  ANTIVERT     promethazine 25 MG suppository  Commonly known as:  PHENERGAN      TAKE these medications       aspirin 81 MG tablet  Take 81 mg by mouth daily.     benzonatate 100 MG capsule  Commonly known as:  TESSALON  Take 1 capsule (100 mg total) by mouth 3 (three) times daily as needed for cough.     CO Q 10 PO  Take 1 tablet by mouth daily.     D3-1000 1000 UNITS tablet  Generic drug:  Cholecalciferol  Take 2,000 Units by mouth daily.  Fish Oil 1200 MG Caps  Take 1 capsule by mouth daily.     Ginger Root 550 MG Caps  Take 1 capsule by mouth daily.     lisinopril-hydrochlorothiazide 20-25 MG per tablet  Commonly known as:  PRINZIDE,ZESTORETIC  Take 1 tablet by mouth daily.     ondansetron 4 MG tablet  Commonly known as:  ZOFRAN  Take 1 tablet (4 mg total) by mouth daily as needed  for nausea or vomiting.     OSTEO BI-FLEX REGULAR STRENGTH 250-200 MG Tabs  Generic drug:  Glucosamine-Chondroitin  Take 1 tablet by mouth daily.     simvastatin 20 MG tablet  Commonly known as:  ZOCOR  Take 20 mg by mouth at bedtime.     temazepam 15 MG capsule  Commonly known as:  RESTORIL  Take 1 capsule (15 mg total) by mouth at bedtime as needed for sleep.     vancomycin 50 mg/mL oral solution  Commonly known as:  VANCOCIN  Take 2.5 mLs (125 mg total) by mouth 4 (four) times daily.       Allergies  Allergen Reactions  . Penicillins Rash    The results of significant diagnostics from this hospitalization (including imaging, microbiology, ancillary and laboratory) are listed below for reference.    Significant Diagnostic Studies: No results found.  Microbiology: Recent Results (from the past 240 hour(s))  URINE CULTURE     Status: None   Collection Time    07/15/13  6:56 PM      Result Value Range Status   Specimen Description URINE, CLEAN CATCH   Final   Special Requests NONE   Final   Culture  Setup Time     Final   Value: 07/15/2013 21:45     Performed at [facility]     Final   Value: 30,000 COLONIES/ML     Performed at [facility]   Culture     Final   Value: Multiple bacterial morphotypes present, none predominant. Suggest appropriate recollection if clinically indicated.     Performed at [facility]   Report Status 07/17/2013 FINAL   Final  STOOL CULTURE     Status: None   Collection Time    07/18/13  1:08 PM      Result Value Range Status   Specimen Description STOOL   Final   Special Requests NONE   Final   Culture     Final   Value: NO SALMONELLA, SHIGELLA, CAMPYLOBACTER, YERSINIA, OR E.COLI 0157:H7 ISOLATED     Performed at [facility]   Report Status 07/22/2013 FINAL   Final  CLOSTRIDIUM DIFFICILE BY PCR     Status: Abnormal   Collection Time    07/18/13  1:08 PM      Result Value Range  Status   C difficile by pcr POSITIVE (*) NEGATIVE Final   Comment: CRITICAL RESULT CALLED TO, READ BACK BY AND VERIFIED WITH:     HOLT,J ON 07/18/13 AT 1445 BY LOY,C     Labs: Basic Metabolic Panel:  Recent Labs Lab 07/15/13 1802 07/18/13 0949 07/19/13 0500 07/20/13 0523  NA 135* 133* 135* 137  K 3.3* 3.4* 3.8 4.4  CL 96 93* 102 103  CO2 26 23 22 24   GLUCOSE 108* 130* 116* 151*  BUN 22 31* 29* 22  CREATININE 0.98 3.40* 1.03 0.94  CALCIUM 8.9 8.7 8.3* 8.4   CBC:  Recent Labs Lab 07/15/13 1802 07/18/13  2035 07/19/13 0500 07/20/13 0523 07/21/13 0608 07/22/13 0631  WBC 13.7* 30.4* 34.9* 33.9* 30.7* 25.6*  NEUTROABS 10.6* 25.8*  --   --   --   --   HGB 11.7* 12.9 13.0 12.6 12.8 12.6  HCT 34.6* 36.6 37.1 36.8 37.2 36.6  MCV 89.2 87.4 87.9 88.5 87.5 87.6  PLT 401* 386 427* 435* 455* 424*    Principal Problem:   C. difficile colitis Active Problems:   HYPERLIPIDEMIA   HYPERTENSION   Dehydration   Hypokalemia   Hypotension   Leukocytosis   Acute renal failure   Time coordinating discharge: 25 minutes  Signed:  Murray Hodgkins, MD [facility] 07/22/2013, 1:33 PM

## 2013-07-22 NOTE — Progress Notes (Signed)
  PROGRESS NOTE  Karen [name] MLY:[id number] DOB: 1946/05/09 DOA: 07/18/2013 PCP: [name], MD  Summary: 68 year old woman present with nausea, vomiting and diarrhea in the context of recent antibiotic use. In the emergency department she was hypotensive, creatinine 3.4, WBC 30.4. Admitted for C. difficile colitis and acute renal failure.  Assessment/Plan: 1. C. difficile colitis, first episode. Rapidly improving clinically. Leukocytosis now trending downwards. Remains afebrile, without pain, tolerating diet. 2. Acute renal failure resolved secondary to diarrhea, dehydration, complicated by hydrochlorothiazide and ACE inhibitor.  3. Dehydration with hyponatremia. Resolved with fluids. 4. Hypotension on admission, resolved. Likely secondary to dehydration.   Discharge home today  Continue oral vancomycin. Ovoid antibiotics, PPIs unless absolutely necessary.  Murray Hodgkins, MD  [facility]  Pager [phone] If 7PM-7AM, please contact night-coverage at [email or web address], password [facility] 07/22/2013, 12:19 PM  LOS: 4 days   Consultants:    Procedures:    Antibiotics:  Oral vancomycin 2/6 >> 2/19  HPI/Subjective: Feels much better today. "I put on lip gloss this morning, I'm ready to go home. Good appetite. No nausea or vomiting. Stool has firmed up. Manageable now can easily make it to the bathroom.  Objective: Filed Vitals:   07/21/13 2147 07/22/13 0625 07/22/13 0628 07/22/13 0629  BP: 149/73 143/63 143/72 129/75  Pulse: 92 91 96 104  Temp: 98.1 F (36.7 C) 98.3 F (36.8 C)    TempSrc: Oral Oral    Resp: 20 20    Height:      Weight:  96.7 kg (213 lb 3 oz)    SpO2: 99% 97%      Intake/Output Summary (Last 24 hours) at 07/22/13 1219 Last data filed at 07/22/13 1128  Gross per 24 hour  Intake    843 ml  Output      0 ml  Net    843 ml     Filed Weights   07/20/13 0631 07/21/13 0650 07/22/13 0625  Weight: 97.3 kg (214 lb 8.1 oz) 97 kg (213 lb 13.5 oz)  96.7 kg (213 lb 3 oz)    Exam:   Afebrile, vital signs stable. Heart rate 104. No hypoxia.  Gen. Appears calm and comfortable. Well-appearing.   Cardiovascular regular rate and rhythm. No murmur, rub or gallop. No lower extremity edema.  Respiratory clear to auscultation bilaterally. No wheezes, rales or rhonchi. Normal respiratory effort.  Abdomen positive bowel sounds. Soft, nontender, nondistended.  Psychiatric grossly normal mood and affect. Speech fluent and appropriate.  Data Reviewed:  5 stools charted overnight.  CBC with modest improvement: 33.9 >> 30.7 >> 25.6 today.  Scheduled Meds: . aspirin EC  81 mg Oral Daily  . cholecalciferol  2,000 Units Oral Daily  . simvastatin  20 mg Oral QHS  . sodium chloride  3 mL Intravenous Q12H  . vancomycin  125 mg Oral QID   Continuous Infusions:   Principal Problem:   C. difficile colitis Active Problems:   HYPERLIPIDEMIA   HYPERTENSION   Dehydration   Hypokalemia   Hypotension   Leukocytosis   Acute renal failure

## 2013-07-22 NOTE — Progress Notes (Signed)
Discharge instructions and prescriptions given, verbalized understanding, out in stable condition via w/c with staff. 

## 2013-07-29 ENCOUNTER — Ambulatory Visit: Payer: Medicare HMO | Admitting: Family Medicine

## 2013-07-31 ENCOUNTER — Encounter: Payer: Self-pay | Admitting: Family Medicine

## 2013-07-31 ENCOUNTER — Ambulatory Visit (HOSPITAL_COMMUNITY)
Admission: RE | Admit: 2013-07-31 | Discharge: 2013-07-31 | Disposition: A | Discharge status: Home / Self Care | Payer: Medicare HMO | Source: Ambulatory Visit | Attending: Family Medicine | Admitting: Family Medicine

## 2013-07-31 ENCOUNTER — Ambulatory Visit (INDEPENDENT_AMBULATORY_CARE_PROVIDER_SITE_OTHER): Payer: Medicare HMO | Admitting: Family Medicine

## 2013-07-31 ENCOUNTER — Other Ambulatory Visit: Payer: Self-pay | Admitting: Family Medicine

## 2013-07-31 VITALS — BP 138/78 | HR 95 | Resp 16 | Ht 66.5 in | Wt 198.0 lb

## 2013-07-31 DIAGNOSIS — A0472 Enterocolitis due to Clostridium difficile, not specified as recurrent: Secondary | ICD-10-CM

## 2013-07-31 DIAGNOSIS — E785 Hyperlipidemia, unspecified: Secondary | ICD-10-CM

## 2013-07-31 DIAGNOSIS — J90 Pleural effusion, not elsewhere classified: Secondary | ICD-10-CM | POA: Insufficient documentation

## 2013-07-31 DIAGNOSIS — N200 Calculus of kidney: Secondary | ICD-10-CM | POA: Insufficient documentation

## 2013-07-31 DIAGNOSIS — D75839 Thrombocytosis, unspecified: Secondary | ICD-10-CM

## 2013-07-31 DIAGNOSIS — D72820 Lymphocytosis (symptomatic): Secondary | ICD-10-CM

## 2013-07-31 DIAGNOSIS — A499 Bacterial infection, unspecified: Secondary | ICD-10-CM

## 2013-07-31 DIAGNOSIS — R748 Abnormal levels of other serum enzymes: Secondary | ICD-10-CM

## 2013-07-31 DIAGNOSIS — G8929 Other chronic pain: Secondary | ICD-10-CM | POA: Insufficient documentation

## 2013-07-31 DIAGNOSIS — G4700 Insomnia, unspecified: Secondary | ICD-10-CM

## 2013-07-31 DIAGNOSIS — B9689 Other specified bacterial agents as the cause of diseases classified elsewhere: Secondary | ICD-10-CM

## 2013-07-31 DIAGNOSIS — J209 Acute bronchitis, unspecified: Secondary | ICD-10-CM

## 2013-07-31 DIAGNOSIS — R933 Abnormal findings on diagnostic imaging of other parts of digestive tract: Secondary | ICD-10-CM | POA: Insufficient documentation

## 2013-07-31 DIAGNOSIS — D473 Essential (hemorrhagic) thrombocythemia: Secondary | ICD-10-CM

## 2013-07-31 DIAGNOSIS — N179 Acute kidney failure, unspecified: Secondary | ICD-10-CM

## 2013-07-31 DIAGNOSIS — E042 Nontoxic multinodular goiter: Secondary | ICD-10-CM

## 2013-07-31 DIAGNOSIS — R1031 Right lower quadrant pain: Secondary | ICD-10-CM

## 2013-07-31 DIAGNOSIS — D72829 Elevated white blood cell count, unspecified: Secondary | ICD-10-CM

## 2013-07-31 DIAGNOSIS — J208 Acute bronchitis due to other specified organisms: Secondary | ICD-10-CM

## 2013-07-31 DIAGNOSIS — I10 Essential (primary) hypertension: Secondary | ICD-10-CM

## 2013-07-31 LAB — CBC WITH DIFFERENTIAL/PLATELET
BASOS ABS: 0 10*3/uL (ref 0.0–0.1)
BASOS PCT: 0 % (ref 0–1)
EOS PCT: 1 % (ref 0–5)
Eosinophils Absolute: 0.1 10*3/uL (ref 0.0–0.7)
HCT: 35.4 % — ABNORMAL LOW (ref 36.0–46.0)
Hemoglobin: 11.8 g/dL — ABNORMAL LOW (ref 12.0–15.0)
Lymphocytes Relative: 51 % — ABNORMAL HIGH (ref 12–46)
Lymphs Abs: 2.9 10*3/uL (ref 0.7–4.0)
MCH: 30 pg (ref 26.0–34.0)
MCHC: 33.3 g/dL (ref 30.0–36.0)
MCV: 90.1 fL (ref 78.0–100.0)
Monocytes Absolute: 0.5 10*3/uL (ref 0.1–1.0)
Monocytes Relative: 9 % (ref 3–12)
NEUTROS ABS: 2.2 10*3/uL (ref 1.7–7.7)
Neutrophils Relative %: 39 % — ABNORMAL LOW (ref 43–77)
PLATELETS: 508 10*3/uL — AB (ref 150–400)
RBC: 3.93 MIL/uL (ref 3.87–5.11)
RDW: 13.5 % (ref 11.5–15.5)
WBC: 5.6 10*3/uL (ref 4.0–10.5)

## 2013-07-31 LAB — HEPATIC FUNCTION PANEL
ALBUMIN: 2.8 g/dL — AB (ref 3.5–5.2)
ALT: 82 U/L — AB (ref 0–35)
AST: 77 U/L — AB (ref 0–37)
Alkaline Phosphatase: 92 U/L (ref 39–117)
Bilirubin, Direct: 0.2 mg/dL (ref 0.0–0.3)
Indirect Bilirubin: 0.3 mg/dL (ref 0.2–1.2)
TOTAL PROTEIN: 6.5 g/dL (ref 6.0–8.3)
Total Bilirubin: 0.5 mg/dL (ref 0.3–1.2)

## 2013-07-31 LAB — BASIC METABOLIC PANEL
BUN: 11 mg/dL (ref 6–23)
CHLORIDE: 100 meq/L (ref 96–112)
CO2: 30 meq/L (ref 19–32)
Calcium: 9.3 mg/dL (ref 8.4–10.5)
Creat: 0.65 mg/dL (ref 0.50–1.10)
GLUCOSE: 92 mg/dL (ref 70–99)
POTASSIUM: 4 meq/L (ref 3.5–5.3)
Sodium: 139 mEq/L (ref 135–145)

## 2013-07-31 IMAGING — CT CT ABD-PELV W/ CM
2 of 5 series · 16 of 46 positions shown, 18 images · IV contrast (omnipaque)
Comparison: None.

CLINICAL DATA: Right lower quadrant abdominal pain.

EXAM:
CT ABDOMEN AND PELVIS WITH CONTRAST
TECHNIQUE: Multidetector CT imaging of the abdomen and pelvis was performed
using the standard protocol following bolus administration of
intravenous contrast.
CONTRAST:  100mL OMNIPAQUE IOHEXOL 300 MG/ML  SOLN

[Series 2: abd_pel_with 5.0 b40f · axial · 0.75mm/px · z∈[-440,-40]mm · 13 of 90 slices shown, 15 images]
[im 5/90  soft-tissue]
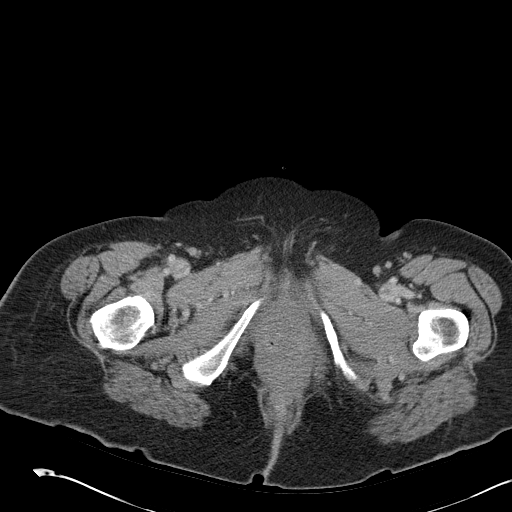
[im 5/90  bone]
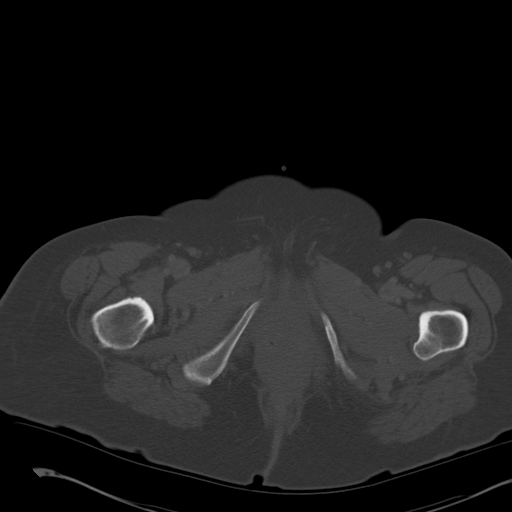
[im 10/90  soft-tissue]
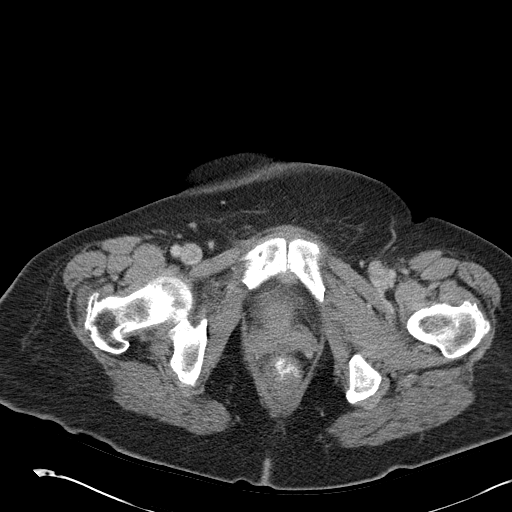
[im 20/90  soft-tissue]
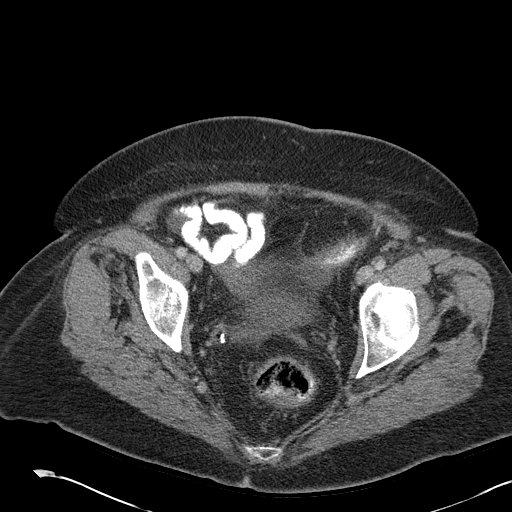
[im 25/90  soft-tissue]
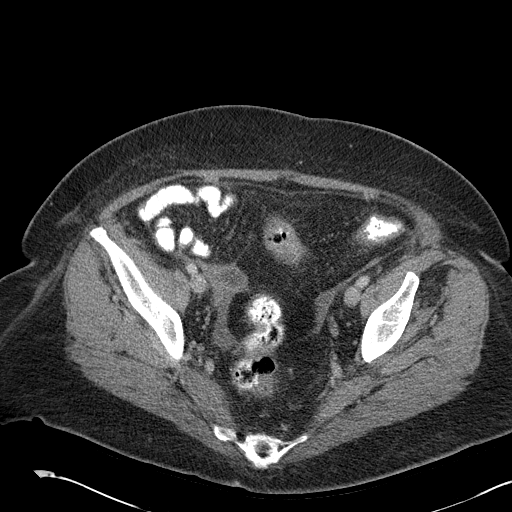
[im 30/90  soft-tissue]
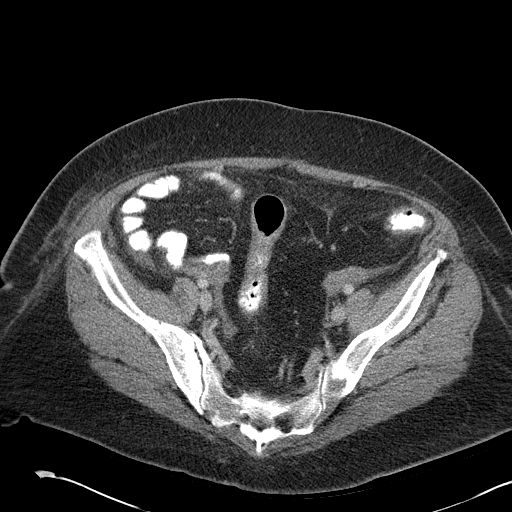
[im 40/90  soft-tissue]
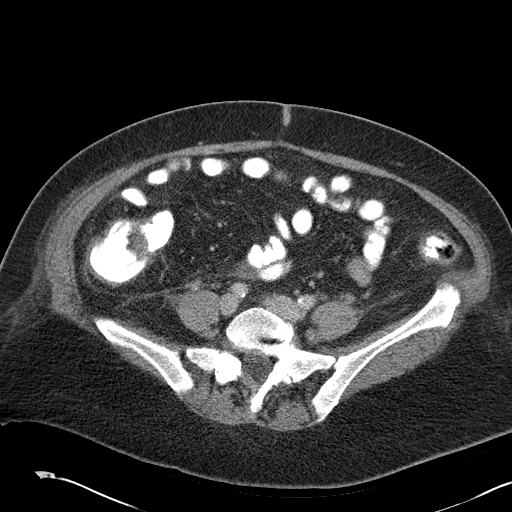
[im 45/90  soft-tissue]
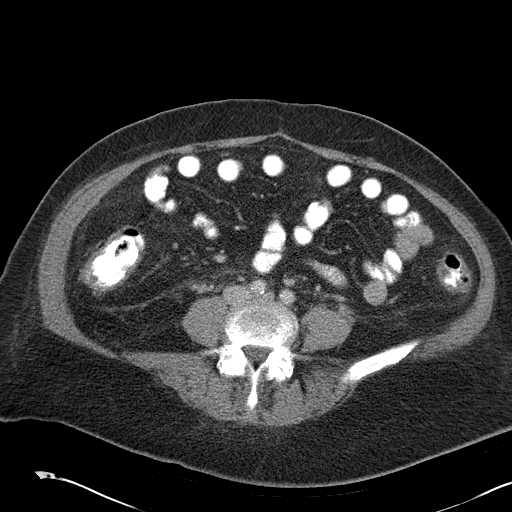
[im 50/90  soft-tissue]
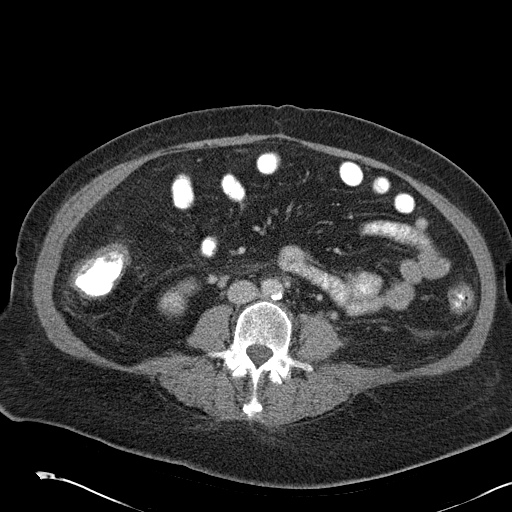
[im 60/90  soft-tissue]
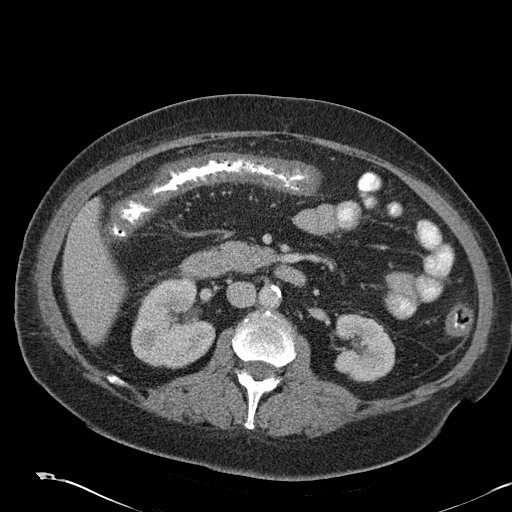
[im 60/90  bone]
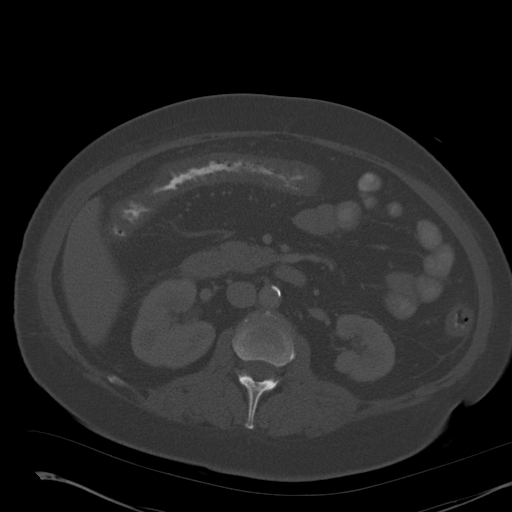
[im 65/90  soft-tissue]
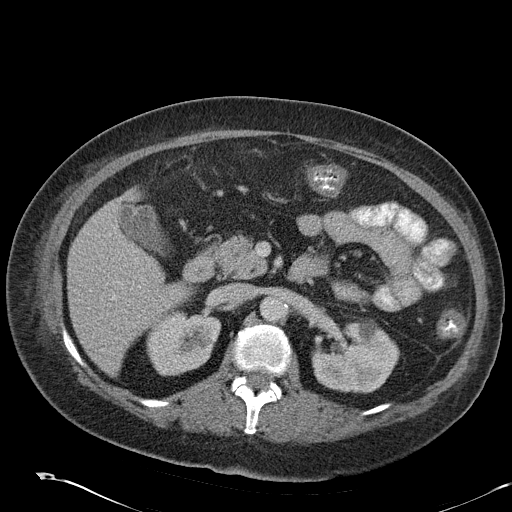
[im 70/90  soft-tissue]
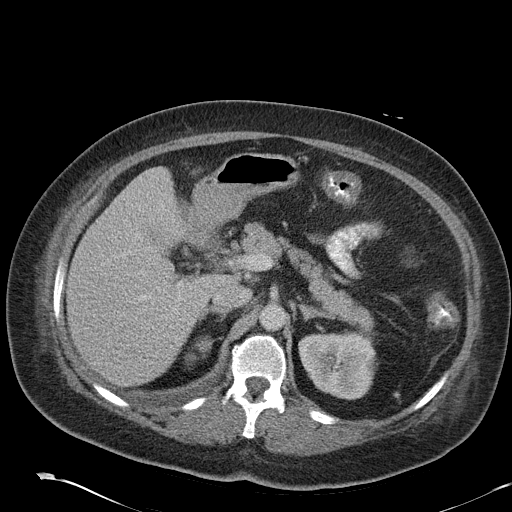
[im 80/90  soft-tissue]
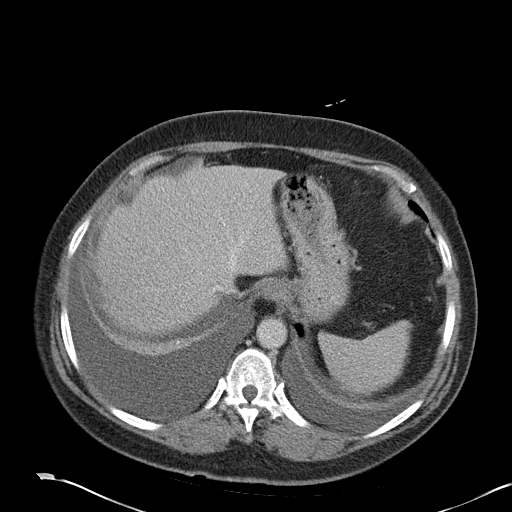
[im 85/90  soft-tissue]
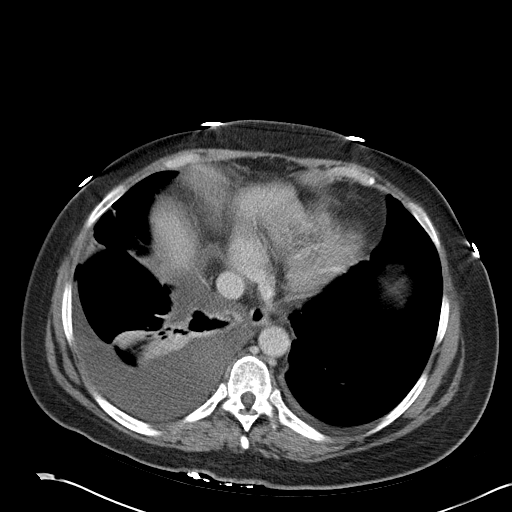

[Series 4: abd_pel_with 3.0 spo · coronal · 0.76mm/px · 3 of 95 slices shown]
[im 32/95  soft-tissue]
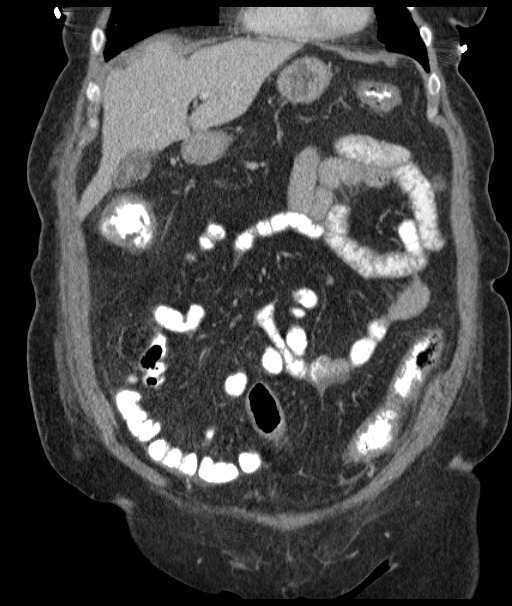
[im 42/95  soft-tissue]
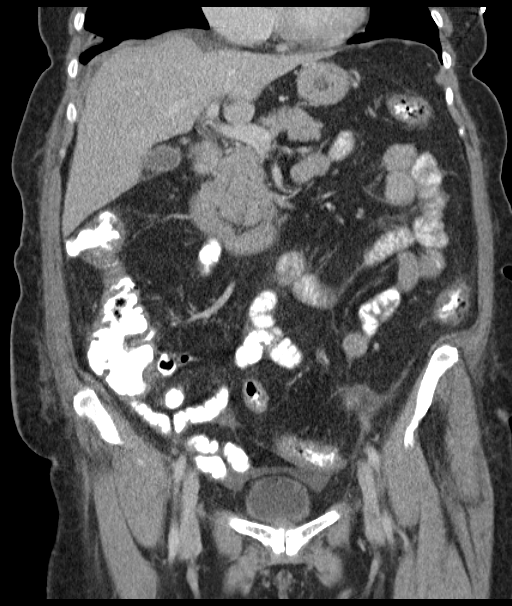
[im 53/95  soft-tissue]
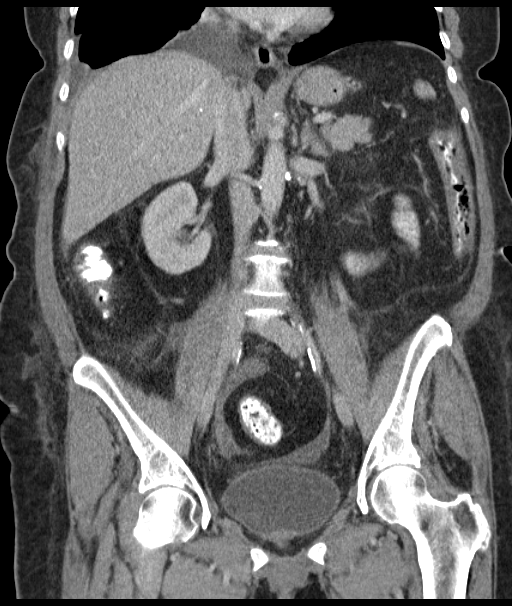

[16 of 46 positions shown; findings below may reference images not displayed]

FINDINGS: Degenerative disc disease is noted at L2-3, L3-4 and L5-S1. Moderate
right pleural effusion is noted with adjacent subsegmental
atelectasis. Mild left pleural effusion is noted.

The liver, spleen and pancreas appear normal. No gallstones are
noted. Adrenal glands appear normal. Bilateral simple cysts are seen
in the kidneys. Nonobstructive 5 mm calculus is noted in left
kidney. The appendix appears normal. Mild wall and fold thickening
is seen involving the transverse, descending and sigmoid colon
consistent with colitis. Small amount of free fluid is seen in the
dependent portion of the pelvis. Urinary bladder appears normal. No
significant adenopathy is noted. Atherosclerotic calcifications of
abdominal aorta are noted without aneurysm formation. The patient
appears to be status post hysterectomy. Left ovary appears normal.
2.2 cm right ovarian cyst is noted which most likely is physiologic.
IMPRESSION: Nonobstructive left renal calculus. No hydronephrosis or renal
obstruction is noted.

Bilateral pleural effusions with right greater than left.

Wall and fold thickening is seen involving the transverse,
descending and sigmoid colon consistent with inflammatory or
infectious colitis.

## 2013-07-31 MED ORDER — IOHEXOL 300 MG/ML  SOLN
100.0000 mL | Freq: Once | INTRAMUSCULAR | Status: AC | PRN
  Administered 2013-07-31: 100 mL via INTRAVENOUS

## 2013-07-31 NOTE — Progress Notes (Signed)
   Subjective:    Patient ID: Karen [name], female    DOB: 05-12-1946, 68 y.o.   MRN: [id number]  HPI Pt in for f/u of recent C diff colitis. Abdominal pain down to a 2 from a 5 when first discharged.but still notes significant tenderness/discomfort in RLQ BM are getting firm, now 5 yesterday was as much as 8 when she first came out of hospital, stool no longer has a foul smell. Appetite is returning slowly, as is her energy. Does note that gets tired with ambulation more easily, but this also is  Improving. Denies cough, sputum or hemoptysis. Has specific question re the value of probiotic use, review of the literature states that if she future need for abiotic, then should [place] this also. 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 Denies chest congestion, productive cough or wheezing. Denies chest pains, palpitations and leg swelling    Denies dysuria, frequency, hesitancy or incontinence. Denies joint pain, swelling and limitation in mobility. Denies headaches, seizures, numbness, or tingling. Denies depression, anxiety or insomnia. Denies skin break down or rash.        Objective:   Physical Exam  BP 138/78  Pulse 95  Resp 16  Ht 5' 6.5" (1.689 m)  Wt 198 lb (89.812 kg)  BMI 31.48 kg/m2  SpO2 98% Patient alert and oriented and in no cardiopulmonary distress.  HEENT: No facial asymmetry, EOMI, no sinus tenderness,  oropharynx pink and moist.  Neck supple no adenopathy.  Chest: Clear to auscultation bilaterally.No crackles or wheezes heard, adequate air entry throughout  CVS: S1, S2 no murmurs, no S3.  ABD: Soft right lower quadrant tenderness, mild guarding , no rebound.  Hyperactive Bs, no palpable organomegaly or masses Ext: No edema  MS: Adequate ROM spine, shoulders, hips and knees.  Skin: Intact, no ulcerations or rash noted.  Psych: Good eye contact, normal affect. Memory intact not anxious or depressed appearing.  CNS: CN 2-12 intact, power, tone and sensation normal throughout.       Assessment & Plan:  Abdominal pain, chronic, right lower quadrant Localized RLQ tenderness, recent marked elevation in WBC, abd scan to r/o acute apprendicitis  HYPERTENSION Controlled, no change in medication DASH diet and commitment to daily physical activity for a minimum of 30 minutes discussed and encouraged, as a part of hypertension management. The importance of attaining a healthy weight is also discussed.   Acute bacterial bronchitis Asymptomatic, treated, however, recent abdominal scan reports bilateral pleural effusions, reviewed with radiologist , less than 25% on "larger " side, will discuss with pulmonary next week when available. Pt asymptomatic , exam is within normal at visit. I suspect that thids  may be in response there recent severe illness from the C diff infection, at one time her WBC was over 30,000  Acute renal failure Resolved, was due to dehydration and seveere infection from C diff colitis  Insomnia Good response to restoril, continue same  HYPERLIPIDEMIA Uncontrolled, but pt needs to stop statin based on elevation in liver function tests .lipo1 rept LFT in 4 to 6weeks

## 2013-07-31 NOTE — Patient Instructions (Signed)
F/u as before  CMP stat today, and cbc and diff stat   Ct scan of abdomen evaluate RLQ pain

## 2013-08-01 ENCOUNTER — Telehealth: Payer: Self-pay | Admitting: Family Medicine

## 2013-08-01 DIAGNOSIS — J90 Pleural effusion, not elsewhere classified: Secondary | ICD-10-CM

## 2013-08-01 DIAGNOSIS — D473 Essential (hemorrhagic) thrombocythemia: Secondary | ICD-10-CM | POA: Insufficient documentation

## 2013-08-01 DIAGNOSIS — D72820 Lymphocytosis (symptomatic): Secondary | ICD-10-CM | POA: Insufficient documentation

## 2013-08-01 DIAGNOSIS — D75839 Thrombocytosis, unspecified: Secondary | ICD-10-CM | POA: Insufficient documentation

## 2013-08-01 NOTE — Telephone Encounter (Signed)
Noted will discuss again with her after I speak with Dr Luan Pulling

## 2013-08-01 NOTE — Assessment & Plan Note (Signed)
Localized RLQ tenderness, recent marked elevation in WBC, abd scan to r/o acute apprendicitis

## 2013-08-01 NOTE — Assessment & Plan Note (Signed)
Controlled, no change in medication DASH diet and commitment to daily physical activity for a minimum of 30 minutes discussed and encouraged, as a part of hypertension management. The importance of attaining a healthy weight is also discussed.  

## 2013-08-01 NOTE — Assessment & Plan Note (Signed)
Uncontrolled, but pt needs to stop statin based on elevation in liver function tests .lipo1 rept LFT in 4 to 6weeks

## 2013-08-01 NOTE — Telephone Encounter (Signed)
Pls contact pt. Explain that I am unable to spk with dr Luan Pulling today, he is unavailable before Monday. This is in relation to the report on her scan describing fluid in her lungs. I did call her last night and promised to f/u with her today so she knows to expect the call. I am entering the [place] of her thyroid gland to evaluate nodules so she may be given that appt info when you call her if care is coordinated efficiently

## 2013-08-01 NOTE — Telephone Encounter (Signed)
May 05, 2003, it is under "imagoing studies" I discussed this with ehr last night also and she recalled having had a thyroid biopsy in the past that wasa benign

## 2013-08-01 NOTE — Telephone Encounter (Signed)
She said that was a long time ago and since she has so much going on at this time she wants to hold off on the thyroid ultrasound until later. I asked if she was sure she didn't just want to get it over with and she said not right now

## 2013-08-01 NOTE — Assessment & Plan Note (Signed)
Good response to restoril, continue same

## 2013-08-01 NOTE — Assessment & Plan Note (Signed)
Asymptomatic, treated, however, recent abdominal scan reports bilateral pleural effusions, reviewed with radiologist , less than 25% on "larger " side, will discuss with pulmonary next week when available. Pt asymptomatic , exam is within normal at visit. I suspect that thids  may be in response there recent severe illness from the C diff infection, at one time her WBC was over 30,000

## 2013-08-01 NOTE — Addendum Note (Signed)
Addended by: Eual Fines on: 08/01/2013 02:27 PM   Modules accepted: Orders

## 2013-08-01 NOTE — Assessment & Plan Note (Signed)
Resolved, was due to dehydration and seveere infection from C diff colitis

## 2013-08-01 NOTE — Telephone Encounter (Signed)
I told patient we were scheduling her an appt for [place] to eval thyroid nodules and patient questioning when it was discovered she had thyroid nodules. I didn't see anything in chart. Please advise

## 2013-08-02 LAB — HEPATITIS PANEL, ACUTE
HCV Ab: NEGATIVE
HEP B C IGM: NONREACTIVE
Hep A IgM: NONREACTIVE
Hepatitis B Surface Ag: NEGATIVE

## 2013-08-05 ENCOUNTER — Telehealth: Payer: Self-pay | Admitting: Family Medicine

## 2013-08-05 NOTE — Telephone Encounter (Signed)
pls hold on scheduling the thyroid US ordered per patient request.  Pls also let her know that I would like her to get the f/u CXR on 04/20, it has already been ordered. I think I had told her last week in April, but I would prefer getting it April 20, thanks

## 2013-08-05 NOTE — Addendum Note (Signed)
Addended by: Fayrene Helper on: 08/05/2013 09:41 AM   Modules accepted: Orders

## 2013-08-05 NOTE — Telephone Encounter (Signed)
I discussed the case with Dr Luan Pulling on 02/23, he does verify that the effusion described may well be a "sympathetic effusion" from her acute illness, recommended rept CXr in 4 to 6 weeks. Scan not needed for f/u , if there is a significant effusion this will be seen on the CXr. I discussed with the pt she is aware and fine with this.order is entered for CXR last week  in April, if she becomes symptomatic as far as pleural effusion is concerned, she will call in sooner. Thyroid US is to be deferred per her request at this time

## 2013-08-08 ENCOUNTER — Emergency Department (HOSPITAL_COMMUNITY)
Admission: EM | Admit: 2013-08-08 | Discharge: 2013-08-09 | Disposition: A | Discharge status: Home / Self Care | Payer: Medicare HMO | Attending: Emergency Medicine | Admitting: Emergency Medicine

## 2013-08-08 ENCOUNTER — Encounter (HOSPITAL_COMMUNITY): Payer: Self-pay | Admitting: Emergency Medicine

## 2013-08-08 ENCOUNTER — Telehealth: Payer: Self-pay

## 2013-08-08 DIAGNOSIS — Z880 Allergy status to penicillin: Secondary | ICD-10-CM | POA: Insufficient documentation

## 2013-08-08 DIAGNOSIS — Z792 Long term (current) use of antibiotics: Secondary | ICD-10-CM | POA: Insufficient documentation

## 2013-08-08 DIAGNOSIS — A0472 Enterocolitis due to Clostridium difficile, not specified as recurrent: Secondary | ICD-10-CM

## 2013-08-08 DIAGNOSIS — Z8619 Personal history of other infectious and parasitic diseases: Secondary | ICD-10-CM | POA: Insufficient documentation

## 2013-08-08 DIAGNOSIS — R197 Diarrhea, unspecified: Secondary | ICD-10-CM | POA: Insufficient documentation

## 2013-08-08 DIAGNOSIS — Z860100 Personal history of colon polyps, unspecified: Secondary | ICD-10-CM | POA: Insufficient documentation

## 2013-08-08 DIAGNOSIS — M129 Arthropathy, unspecified: Secondary | ICD-10-CM | POA: Insufficient documentation

## 2013-08-08 DIAGNOSIS — Z7982 Long term (current) use of aspirin: Secondary | ICD-10-CM | POA: Insufficient documentation

## 2013-08-08 DIAGNOSIS — Z79899 Other long term (current) drug therapy: Secondary | ICD-10-CM | POA: Insufficient documentation

## 2013-08-08 DIAGNOSIS — I10 Essential (primary) hypertension: Secondary | ICD-10-CM | POA: Insufficient documentation

## 2013-08-08 DIAGNOSIS — E785 Hyperlipidemia, unspecified: Secondary | ICD-10-CM | POA: Insufficient documentation

## 2013-08-08 LAB — CBC WITH DIFFERENTIAL/PLATELET
BASOS ABS: 0 10*3/uL (ref 0.0–0.1)
Basophils Absolute: 0 10*3/uL (ref 0.0–0.1)
Basophils Relative: 0 % (ref 0–1)
Basophils Relative: 0 % (ref 0–1)
Eosinophils Absolute: 0.1 10*3/uL (ref 0.0–0.7)
Eosinophils Absolute: 0 10*3/uL (ref 0.0–0.7)
Eosinophils Relative: 1 % (ref 0–5)
Eosinophils Relative: 0 % (ref 0–5)
HCT: 39.4 % (ref 36.0–46.0)
HCT: 38.1 % (ref 36.0–46.0)
HEMOGLOBIN: 13 g/dL (ref 12.0–15.0)
Hemoglobin: 13 g/dL (ref 12.0–15.0)
LYMPHS PCT: 15 % (ref 12–46)
Lymphocytes Relative: 27 % (ref 12–46)
Lymphs Abs: 1.8 10*3/uL (ref 0.7–4.0)
Lymphs Abs: 1.4 10*3/uL (ref 0.7–4.0)
MCH: 29.5 pg (ref 26.0–34.0)
MCH: 30.5 pg (ref 26.0–34.0)
MCHC: 33 g/dL (ref 30.0–36.0)
MCHC: 34.1 g/dL (ref 30.0–36.0)
MCV: 89.3 fL (ref 78.0–100.0)
MCV: 89.4 fL (ref 78.0–100.0)
MONOS PCT: 12 % (ref 3–12)
Monocytes Absolute: 0.7 10*3/uL (ref 0.1–1.0)
Monocytes Absolute: 1.1 10*3/uL — ABNORMAL HIGH (ref 0.1–1.0)
Monocytes Relative: 10 % (ref 3–12)
NEUTROS ABS: 4.2 10*3/uL (ref 1.7–7.7)
NEUTROS ABS: 6.9 10*3/uL (ref 1.7–7.7)
NEUTROS PCT: 62 % (ref 43–77)
Neutrophils Relative %: 74 % (ref 43–77)
Platelets: 518 10*3/uL — ABNORMAL HIGH (ref 150–400)
Platelets: 497 10*3/uL — ABNORMAL HIGH (ref 150–400)
RBC: 4.41 MIL/uL (ref 3.87–5.11)
RBC: 4.26 MIL/uL (ref 3.87–5.11)
RDW: 13.9 % (ref 11.5–15.5)
RDW: 14 % (ref 11.5–15.5)
WBC: 6.7 10*3/uL (ref 4.0–10.5)
WBC: 9.3 10*3/uL (ref 4.0–10.5)

## 2013-08-08 LAB — COMPREHENSIVE METABOLIC PANEL
ALK PHOS: 82 U/L (ref 39–117)
ALT: 34 U/L (ref 0–35)
ALT: 32 U/L (ref 0–35)
AST: 24 U/L (ref 0–37)
AST: 24 U/L (ref 0–37)
Albumin: 3.7 g/dL (ref 3.5–5.2)
Albumin: 3.3 g/dL — ABNORMAL LOW (ref 3.5–5.2)
Alkaline Phosphatase: 80 U/L (ref 39–117)
BILIRUBIN TOTAL: 1 mg/dL (ref 0.3–1.2)
BUN: 13 mg/dL (ref 6–23)
BUN: 15 mg/dL (ref 6–23)
CALCIUM: 10.2 mg/dL (ref 8.4–10.5)
CHLORIDE: 102 meq/L (ref 96–112)
CHLORIDE: 96 meq/L (ref 96–112)
CO2: 28 meq/L (ref 19–32)
CO2: 25 meq/L (ref 19–32)
CREATININE: 0.67 mg/dL (ref 0.50–1.10)
CREATININE: 0.66 mg/dL (ref 0.50–1.10)
Calcium: 9.8 mg/dL (ref 8.4–10.5)
GFR calc Af Amer: >90 mL/min (ref >90)
Glucose, Bld: 95 mg/dL (ref 70–99)
Glucose, Bld: 135 mg/dL — ABNORMAL HIGH (ref 70–99)
Potassium: 4.3 mEq/L (ref 3.5–5.3)
Potassium: 3.4 mEq/L — ABNORMAL LOW (ref 3.7–5.3)
Sodium: 133 mEq/L — ABNORMAL LOW (ref 135–145)
Sodium: 134 mEq/L — ABNORMAL LOW (ref 137–147)
Total Bilirubin: 0.9 mg/dL (ref 0.2–1.2)
Total Protein: 6.9 g/dL (ref 6.0–8.3)
Total Protein: 7.6 g/dL (ref 6.0–8.3)

## 2013-08-08 MED ORDER — SODIUM CHLORIDE 0.9 % IV BOLUS (SEPSIS)
1000.0000 mL | Freq: Once | INTRAVENOUS | Status: AC
Start: 2013-08-08 — End: 2013-08-09
  Administered 2013-08-08: 1000 mL via INTRAVENOUS

## 2013-08-08 MED ORDER — ONDANSETRON 8 MG PO TBDP: ORAL_TABLET | ORAL | Status: DC

## 2013-08-08 MED ORDER — VANCOMYCIN 50 MG/ML ORAL SOLUTION
125.0000 mg | Freq: Four times a day (QID) | ORAL | Status: AC
Start: 2013-08-08 — End: 2013-08-22

## 2013-08-08 MED ORDER — DIPHENOXYLATE-ATROPINE 2.5-0.025 MG PO TABS: 1.0000 | ORAL_TABLET | Freq: Four times a day (QID) | ORAL | Status: DC | PRN

## 2013-08-08 MED ORDER — SODIUM CHLORIDE 0.9 % IV BOLUS (SEPSIS)
1000.0000 mL | Freq: Once | INTRAVENOUS | Status: AC
  Administered 2013-08-08: 1000 mL via INTRAVENOUS

## 2013-08-08 MED ORDER — ONDANSETRON HCL 4 MG/2ML IJ SOLN
4.0000 mg | Freq: Once | INTRAMUSCULAR | Status: AC
  Administered 2013-08-08: 4 mg via INTRAVENOUS
  Filled 2013-08-08: qty 2

## 2013-08-08 NOTE — ED Notes (Signed)
PT C/O N/V/D. PT SAYS SHE WAS ADMITTED EARLIER THIS MONTH WITH C-DIFF AND SHE THINKS IT MAY HAVE CAME BACK. PT ALSO C/O LOW BP

## 2013-08-08 NOTE — Telephone Encounter (Signed)
Dr. Gala Romney is GI on call and has been paged to back office line.

## 2013-08-08 NOTE — ED Provider Notes (Signed)
CSN: [id number]     Arrival date & time 08/08/13  1902 History   First MD Initiated Contact with Patient 08/08/13 2234     Chief Complaint  Patient presents with  . Diarrhea     (Consider location/radiation/quality/duration/timing/severity/associated sxs/prior Treatment) Patient is a 68 y.o. female presenting with diarrhea.  Diarrhea .... several episodes of diarrhea past 24 hours, nonbloody, non-mucousy. Patient admitted to the hospital within the past month for C. Difficile.   Severity today is moderate. Nothing makes symptoms better or worse. No abdominal pain, fever, chills, dysuria.   Her primary care physician started her on PO vancomycin today   Past Medical History  Diagnosis Date  . Hypertension   . Hyperlipidemia   . Arthritis   . Prediabetes 2014    lifestyle    Past Surgical History  Procedure Laterality Date  . Ankle surgery    . Abdominal hysterectomy  1983 approx    fibroids, partial  . Fracture surgery Right mid 1990's    pin put in and waas removed  . Colonoscopy  04/27/2005    [id number] polyp at the splenic flexure cold biopsied/removed. Mid  descending polyp removed with cold snare technique. The remainder of the of the colon looked normal/ Normal rectum. inflammed adenomatous polyps.  . Colonoscopy N/A 05/26/2013    Procedure: COLONOSCOPY;  Surgeon: Daneil Dolin, MD;  Location: [facility];  Service: Endoscopy;  Laterality: N/A;  11:00AM   Family History  Problem Relation Age of Onset  . Arthritis    . Diabetes     History  Substance Use Topics  . Smoking status: Never Smoker   . Smokeless tobacco: Never Used  . Alcohol Use: No   OB History   Grav Para Term Preterm Abortions TAB SAB Ect Mult Living   4 3 3  1  1   3      Review of Systems  Gastrointestinal: Positive for diarrhea.  All other systems reviewed and are negative.      Allergies  Penicillins  Home Medications   Current Outpatient Rx  Name  Route  Sig  Dispense   Refill  . aspirin 81 MG tablet   Oral   Take 81 mg by mouth daily.         . Cholecalciferol (D3-1000) 1000 UNITS tablet   Oral   Take 2,000 Units by mouth daily.          . Coenzyme Q10 (CO Q 10 PO)   Oral   Take 1 tablet by mouth daily.          . diphenoxylate-atropine (LOMOTIL) 2.5-0.025 MG per tablet   Oral   Take 1 tablet by mouth 4 (four) times daily as needed for diarrhea or loose stools.   20 tablet   0   . Ginger, Zingiber officinalis, (GINGER ROOT) 550 MG CAPS   Oral   Take 1 capsule by mouth daily.          . Glucosamine-Chondroitin (OSTEO BI-FLEX REGULAR STRENGTH) 250-200 MG TABS   Oral   Take 1 tablet by mouth daily.         Marland Kitchen lisinopril-hydrochlorothiazide (PRINZIDE,ZESTORETIC) 20-25 MG per tablet   Oral   Take 1 tablet by mouth daily.   90 tablet   1   . Omega-3 Fatty Acids (FISH OIL) 1200 MG CAPS   Oral   Take 1 capsule by mouth daily.          . ondansetron (ZOFRAN ODT)  8 MG disintegrating tablet      8mg  ODT q4 hours prn nausea   4 tablet   10   . temazepam (RESTORIL) 15 MG capsule   Oral   Take 1 capsule (15 mg total) by mouth at bedtime as needed for sleep.   30 capsule   5   . vancomycin (VANCOCIN) 50 mg/mL oral solution   Oral   Take 2.5 mLs (125 mg total) by mouth 4 (four) times daily.   100 mL   0   . vancomycin (VANCOCIN) 50 mg/mL oral solution   Oral   Take 2.5 mLs (125 mg total) by mouth every 6 (six) hours.   140 mL   0    BP 132/62  Pulse 112  Temp(Src) 99.6 F (37.6 C) (Oral)  Resp 18  Ht 5' 6.5" (1.689 m)  Wt 180 lb (81.647 kg)  BMI 28.62 kg/m2  SpO2 96% Physical Exam  Nursing note and vitals reviewed. Constitutional: She is oriented to person, place, and time. She appears well-developed and well-nourished.  Well-hydrated  HENT:  Head: Normocephalic and atraumatic.  Eyes: Conjunctivae and EOM are normal. Pupils are equal, round, and reactive to light.  Neck: Normal range of motion. Neck supple.   Cardiovascular: Normal rate, regular rhythm and normal heart sounds.   Pulmonary/Chest: Effort normal and breath sounds normal.  Abdominal: Soft. Bowel sounds are normal.  Musculoskeletal: Normal range of motion.  Neurological: She is alert and oriented to person, place, and time.  Skin: Skin is warm and dry.  Psychiatric: She has a normal mood and affect. Her behavior is normal.    ED Course  Procedures (including critical care time) Labs Review Labs Reviewed  CBC WITH DIFFERENTIAL - Abnormal; Notable for the following:    Platelets 497 (*)    Monocytes Absolute 1.1 (*)    All other components within normal limits  COMPREHENSIVE METABOLIC PANEL - Abnormal; Notable for the following:    Sodium 134 (*)    Potassium 3.4 (*)    Glucose, Bld 135 (*)    Albumin 3.3 (*)    All other components within normal limits  CLOSTRIDIUM DIFFICILE BY PCR   Imaging Review No results found.   EKG Interpretation None      MDM   Final diagnoses:  Diarrhea    Patient is nontoxic. 2 L of IV fluid given. C. difficile culture pending. Discharge medications Zofran 8 mg ODT and Lomotil. Patient will continue vancomycin until followup with her primary care physician.    Nat Christen, MD 08/08/13 [phone]

## 2013-08-08 NOTE — Telephone Encounter (Signed)
Needs to rept stool c diff test and will ask opinion from gI on call pls contact GI on call asap, switchboard will page the Doc for [place] let me spk with them as to what to do, from sound of things she will  likely need to see gI specialist as well

## 2013-08-08 NOTE — Telephone Encounter (Signed)
Patient will have someone to come by office and collect kit for specimen.

## 2013-08-08 NOTE — Discharge Instructions (Signed)
Stool culture pending. Medication for nausea and diarrhea. Increase fluids.   Continue vancomycin until followup with your physician.

## 2013-08-08 NOTE — Telephone Encounter (Signed)
Noted and ordered  Med sent to pharmacy

## 2013-08-08 NOTE — Telephone Encounter (Signed)
pls order cbc and diff and cmp to be drawn today, not stat, this needs to be given to her spouse when he comes to collect containers for stool. I spoke with Dr Sydell Axon and with pt, he will see her next week, he advised 2 weeks more of the oral vanc pls fax to [facility] pharmacy, she is also to take a probiotic or yoogurt

## 2013-08-08 NOTE — ED Notes (Signed)
Pt had been admitted to hospital a few weeks ago w/ c dif pt states think it has returned. Pt started on vanc by PCP & has taken 1 dose at 1715

## 2013-08-09 LAB — CLOSTRIDIUM DIFFICILE BY PCR: Toxigenic C. Difficile by PCR: NEGATIVE

## 2013-08-09 NOTE — ED Notes (Signed)
Pt alert & oriented x4, stable gait. Patient given discharge instructions, paperwork & prescription(s). Patient  instructed to stop at the registration desk to finish any additional paperwork. Patient verbalized understanding. Pt left department w/ no further questions. 

## 2013-08-11 LAB — CLOSTRIDIUM DIFFICILE EIA: CDIFTX: POSITIVE

## 2013-08-12 ENCOUNTER — Ambulatory Visit (INDEPENDENT_AMBULATORY_CARE_PROVIDER_SITE_OTHER): Payer: Medicare HMO | Admitting: Gastroenterology

## 2013-08-12 ENCOUNTER — Encounter: Payer: Self-pay | Admitting: Gastroenterology

## 2013-08-12 VITALS — BP 117/65 | HR 86 | Temp 97.6°F | Wt 185.6 lb

## 2013-08-12 DIAGNOSIS — A0472 Enterocolitis due to Clostridium difficile, not specified as recurrent: Secondary | ICD-10-CM

## 2013-08-12 NOTE — Assessment & Plan Note (Signed)
68 year old female with recent hospital admission secondary to [place], risks factors include prior exposure to abx and patient with confirmed Cdiff. Tested positive again for Cdiff close to end of original Vanc dosing; this has been extended an additional 2 weeks. Clinically improving. Complete additional 2 weeks, probiotic X 3 months, avoid abx exposure, avoid PPIs. Discussed santization measures. Will hold off on taper of Vanc unless patient fails to improve. However, doing significantly better at time of visit. Return in 3 months. Next colonoscopy 2017.

## 2013-08-12 NOTE — Patient Instructions (Signed)
Complete the 2 weeks of oral vancomycin.  Continue to take a probiotic daily for 3 months.   Please call me if you have any worsening of your symptoms OR if symptoms recur again after vancomycin is completed.   We will see you in 3 months or sooner if needed!

## 2013-08-12 NOTE — Progress Notes (Signed)
Referring Provider: Fayrene Helper, MD Primary Care Physician:  [name], MD Primary GI: Dr. Gala Romney  Chief Complaint  Patient presents with  . Follow-up    HPI:   Karen [name] presents today in follow-up for Cdiff. Hospitalized early Feb with Cdiff colitis, dehydration, acute renal failure. Sent home with Vancomycin 125 mg po QID for total of 10 days. Repeat Cdiff through PCP was positive; however, the same day Cdiff was noted as negative in the ED. Recommended by Dr. Gala Romney to complete an additional 2 weeks of oral vanc.   Worked with a lady who had Cdiff. Had also been on abx for respiratory infection. Taking a probiotic. Not on a PPI. Yesterday had 2 loose stools. Stopped taking Lomotil. Takes Imodium only rarely. States stool not as watery. Appetite returning.    Past Medical History  Diagnosis Date  . Hypertension   . Hyperlipidemia   . Arthritis   . Prediabetes 2014    lifestyle   . C. difficile colitis Jul 18, 2013    Vancomycin. Recheck Cdiff positive. Vancomycin continued for an additional 2 weeks    Past Surgical History  Procedure Laterality Date  . Ankle surgery    . Abdominal hysterectomy  1983 approx    fibroids, partial  . Fracture surgery Right mid 1990's    pin put in and waas removed  . Colonoscopy  04/27/2005    [id number] polyp at the splenic flexure cold biopsied/removed. Mid  descending polyp removed with cold snare technique. The remainder of the of the colon looked normal/ Normal rectum. inflammed adenomatous polyps.  . Colonoscopy N/A 05/26/2013    Dr. Rourk:Multiple colonic polyps removed, tubular adenomas. Needs surveillance Dec 2017.     Current Outpatient Prescriptions  Medication Sig Dispense Refill  . aspirin 81 MG tablet Take 81 mg by mouth daily.      . Cholecalciferol (D3-1000) 1000 UNITS tablet Take 2,000 Units by mouth daily.       . Coenzyme Q10 (CO Q 10 PO) Take 1 tablet by mouth daily.       . Ginger, Zingiber  officinalis, (GINGER ROOT) 550 MG CAPS Take 1 capsule by mouth daily.       . Glucosamine-Chondroitin (OSTEO BI-FLEX REGULAR STRENGTH) 250-200 MG TABS Take 1 tablet by mouth daily.      Marland Kitchen lisinopril-hydrochlorothiazide (PRINZIDE,ZESTORETIC) 20-25 MG per tablet Take 1 tablet by mouth daily.  90 tablet  1  . Omega-3 Fatty Acids (FISH OIL) 1200 MG CAPS Take 1 capsule by mouth daily.       . ondansetron (ZOFRAN ODT) 8 MG disintegrating tablet 8mg  ODT q4 hours prn nausea  4 tablet  10  . Probiotic Product (PROBIOTIC DAILY PO) Take by mouth daily.      . temazepam (RESTORIL) 15 MG capsule Take 1 capsule (15 mg total) by mouth at bedtime as needed for sleep.  30 capsule  5  . vancomycin (VANCOCIN) 50 mg/mL oral solution Take 2.5 mLs (125 mg total) by mouth every 6 (six) hours.  140 mL  0  . diphenoxylate-atropine (LOMOTIL) 2.5-0.025 MG per tablet Take 1 tablet by mouth 4 (four) times daily as needed for diarrhea or loose stools.  20 tablet  0  . vancomycin (VANCOCIN) 50 mg/mL oral solution Take 2.5 mLs (125 mg total) by mouth 4 (four) times daily.  100 mL  0   No current facility-administered medications for this visit.    Allergies as of 08/12/2013 - Review  Complete 08/12/2013  Allergen Reaction Noted  . Penicillins Rash 03/22/2010    Family History  Problem Relation Age of Onset  . Arthritis    . Diabetes      History   Social History  . Marital Status: Married    Spouse Name: N/A    Number of Children: N/A  . Years of Education: 45   Social History Main Topics  . Smoking status: Never Smoker   . Smokeless tobacco: Never Used  . Alcohol Use: No  . Drug Use: No  . Sexual Activity: Yes    Birth Control/ Protection: Surgical   Other Topics Concern  . None   Social History Narrative  . None    Review of Systems: As mentioned in HPI.   Physical Exam: BP 117/65  Pulse 86  Temp(Src) 97.6 F (36.4 C) (Oral)  Wt 185 lb 9.6 oz (84.188 kg) General:   Alert and oriented. No  distress noted. Pleasant and cooperative.  Head:  Normocephalic and atraumatic. Eyes:  Conjuctiva clear without scleral icterus. Mouth:  Oral mucosa pink and moist. Good dentition. No lesions. Cardiac: S1 S2 present, no murmurs appreciated Abdomen:  +BS, soft, non-tender and non-distended. No rebound or guarding. No HSM or masses noted. Msk:  Symmetrical without gross deformities. Normal posture. Extremities:  Without edema. Neurologic:  Alert and  oriented x4;  grossly normal neurologically. Skin:  Intact without significant lesions or rashes. Psych:  Alert and cooperative. Normal mood and affect.

## 2013-08-13 NOTE — Progress Notes (Signed)
cc'd to pcp 

## 2013-09-05 ENCOUNTER — Telehealth: Payer: Self-pay | Admitting: Family Medicine

## 2013-09-05 NOTE — Telephone Encounter (Signed)
Yes still needs CXR April 20

## 2013-09-05 NOTE — Telephone Encounter (Signed)
Please advise.  She is suppose to have done on 4/20 as noted in telephone call from February.

## 2013-09-08 ENCOUNTER — Other Ambulatory Visit: Payer: Self-pay | Admitting: Family Medicine

## 2013-09-08 NOTE — Telephone Encounter (Signed)
Patient aware.

## 2013-09-30 ENCOUNTER — Ambulatory Visit (HOSPITAL_COMMUNITY)
Admission: RE | Admit: 2013-09-30 | Discharge: 2013-09-30 | Disposition: A | Discharge status: Home / Self Care | Payer: Medicare HMO | Source: Ambulatory Visit | Attending: Family Medicine | Admitting: Family Medicine

## 2013-09-30 DIAGNOSIS — Z0389 Encounter for observation for other suspected diseases and conditions ruled out: Secondary | ICD-10-CM | POA: Insufficient documentation

## 2013-09-30 DIAGNOSIS — J90 Pleural effusion, not elsewhere classified: Secondary | ICD-10-CM | POA: Insufficient documentation

## 2013-09-30 IMAGING — CR DG CHEST 2V
2 series · 2 of 2 positions shown · non-contrast
Comparison: CT ABD - PELV W/ CM dated [DATE]

CLINICAL DATA: Evaluate pleural effusion identified on prior CT.

EXAM:
CHEST  2 VIEW

[view not recorded (1 of 2)]
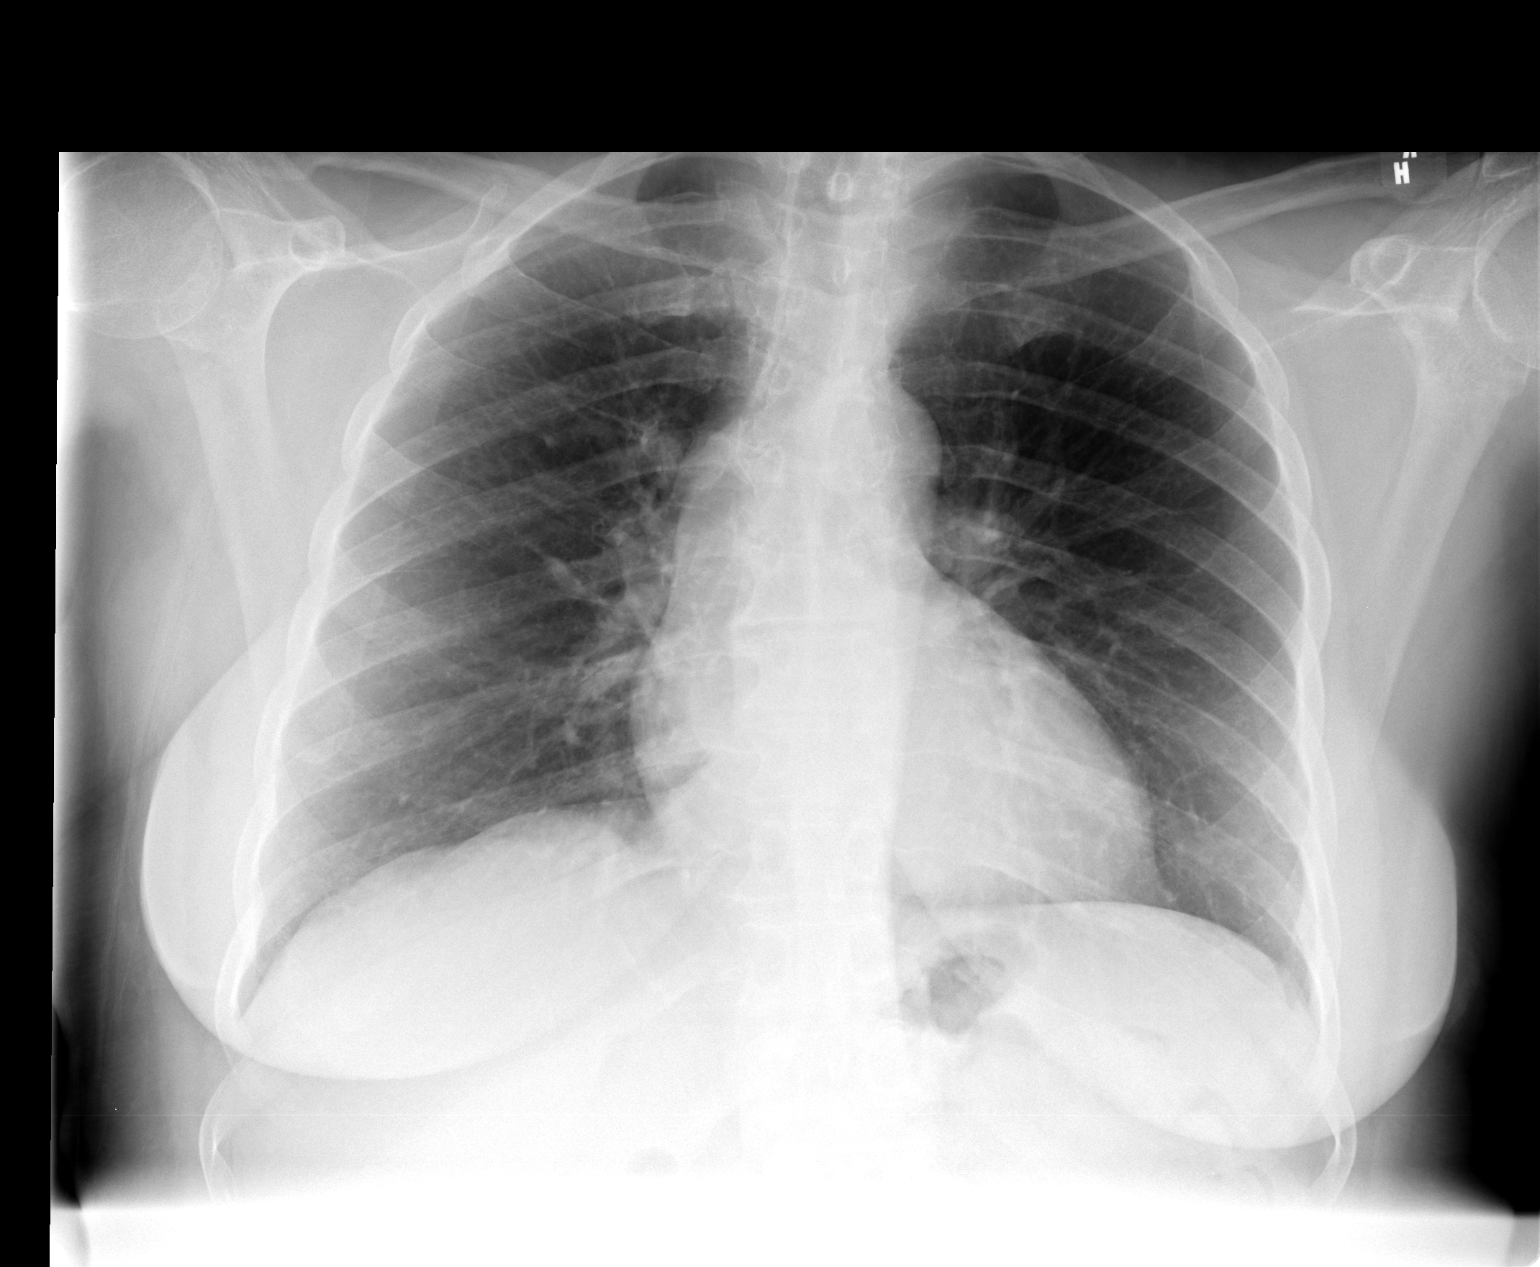

[view not recorded (2 of 2)]
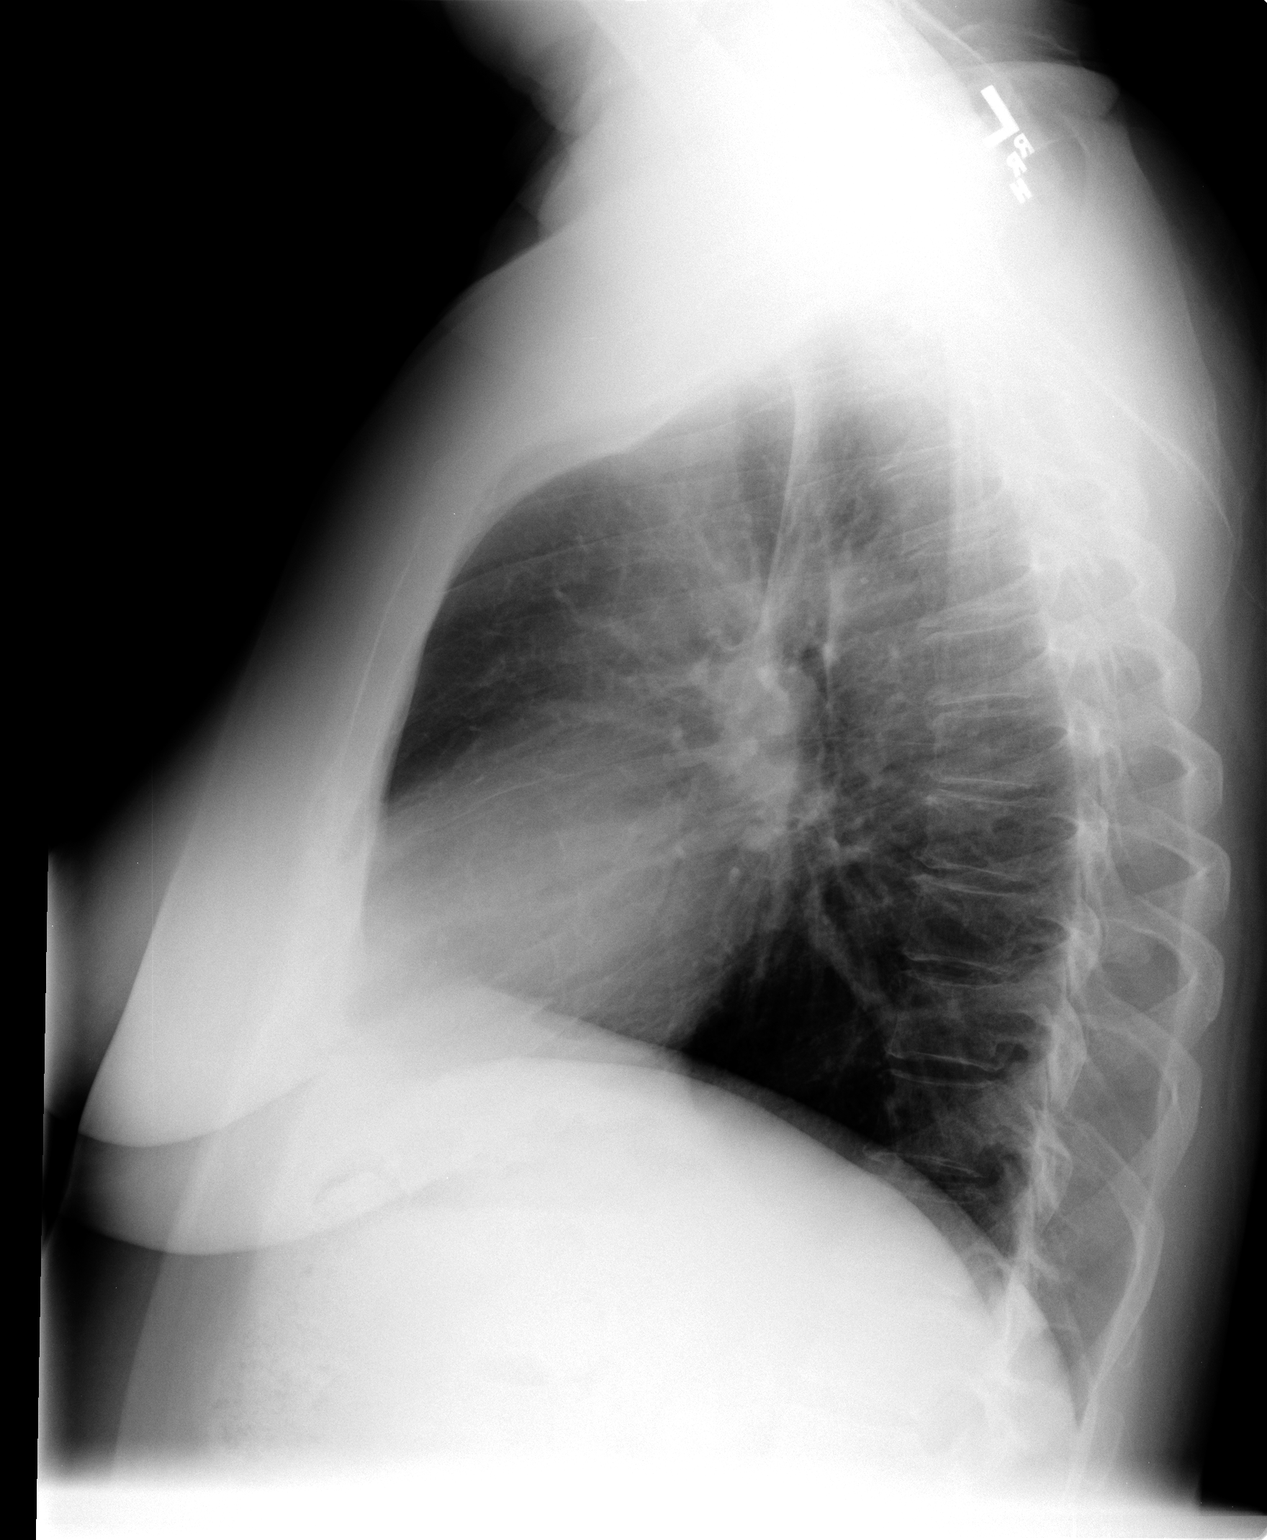

[2 of 2 positions shown; findings below may reference images not displayed]

FINDINGS: Normal cardiac and mediastinal contours. No consolidative pulmonary
opacities. No pleural effusion or pneumothorax. Regional skeleton is
unremarkable.
IMPRESSION: No acute cardiopulmonary process.

No pleural effusion.

## 2013-10-18 LAB — COMPREHENSIVE METABOLIC PANEL
ALBUMIN: 3.6 g/dL (ref 3.5–5.2)
ALT: 13 U/L (ref 0–35)
AST: 15 U/L (ref 0–37)
Alkaline Phosphatase: 65 U/L (ref 39–117)
BUN: 16 mg/dL (ref 6–23)
CHLORIDE: 101 meq/L (ref 96–112)
CO2: 28 mEq/L (ref 19–32)
Calcium: 9.3 mg/dL (ref 8.4–10.5)
Creat: 0.79 mg/dL (ref 0.50–1.10)
Glucose, Bld: 87 mg/dL (ref 70–99)
POTASSIUM: 3.9 meq/L (ref 3.5–5.3)
Sodium: 138 mEq/L (ref 135–145)
Total Bilirubin: 0.4 mg/dL (ref 0.2–1.2)
Total Protein: 6.6 g/dL (ref 6.0–8.3)

## 2013-10-18 LAB — LIPID PANEL
CHOLESTEROL: 247 mg/dL — AB (ref 0–200)
HDL: 45 mg/dL (ref >39)
LDL CALC: 190 mg/dL — AB (ref 0–99)
Total CHOL/HDL Ratio: 5.5 Ratio
Triglycerides: 61 mg/dL (ref <150)
VLDL: 12 mg/dL (ref 0–40)

## 2013-10-18 LAB — HEMOGLOBIN A1C
Hgb A1c MFr Bld: 5.8 % — ABNORMAL HIGH (ref <5.7)
Mean Plasma Glucose: 120 mg/dL — ABNORMAL HIGH (ref <117)

## 2013-10-23 ENCOUNTER — Ambulatory Visit (INDEPENDENT_AMBULATORY_CARE_PROVIDER_SITE_OTHER): Payer: Medicare HMO | Admitting: Family Medicine

## 2013-10-23 ENCOUNTER — Encounter: Payer: Self-pay | Admitting: Family Medicine

## 2013-10-23 ENCOUNTER — Encounter (INDEPENDENT_AMBULATORY_CARE_PROVIDER_SITE_OTHER): Payer: Self-pay

## 2013-10-23 VITALS — BP 130/74 | HR 76 | Resp 16 | Wt 196.1 lb

## 2013-10-23 DIAGNOSIS — M179 Osteoarthritis of knee, unspecified: Secondary | ICD-10-CM

## 2013-10-23 DIAGNOSIS — E669 Obesity, unspecified: Secondary | ICD-10-CM

## 2013-10-23 DIAGNOSIS — M1710 Unilateral primary osteoarthritis, unspecified knee: Secondary | ICD-10-CM

## 2013-10-23 DIAGNOSIS — E785 Hyperlipidemia, unspecified: Secondary | ICD-10-CM

## 2013-10-23 DIAGNOSIS — R7303 Prediabetes: Secondary | ICD-10-CM

## 2013-10-23 DIAGNOSIS — E8881 Metabolic syndrome: Secondary | ICD-10-CM

## 2013-10-23 DIAGNOSIS — G4700 Insomnia, unspecified: Secondary | ICD-10-CM

## 2013-10-23 DIAGNOSIS — E042 Nontoxic multinodular goiter: Secondary | ICD-10-CM

## 2013-10-23 DIAGNOSIS — R7309 Other abnormal glucose: Secondary | ICD-10-CM

## 2013-10-23 DIAGNOSIS — IMO0002 Reserved for concepts with insufficient information to code with codable children: Secondary | ICD-10-CM

## 2013-10-23 DIAGNOSIS — I10 Essential (primary) hypertension: Secondary | ICD-10-CM

## 2013-10-23 MED ORDER — LISINOPRIL-HYDROCHLOROTHIAZIDE 20-25 MG PO TABS: ORAL_TABLET | ORAL | Status: DC

## 2013-10-23 MED ORDER — EZETIMIBE 10 MG PO TABS: 10.0000 mg | ORAL_TABLET | Freq: Every day | ORAL | Status: DC

## 2013-10-23 NOTE — Assessment & Plan Note (Signed)
Deteriorated. Patient re-educated about  the importance of commitment to a  minimum of 150 minutes of exercise per week. The importance of healthy food choices with portion control discussed. Encouraged to start a food diary, count calories and to consider  joining a support group. Sample diet sheets offered. Goals set by the patient for the next several months.    

## 2013-10-23 NOTE — Assessment & Plan Note (Signed)
Controlled, no change in medication DASH diet and commitment to daily physical activity for a minimum of 30 minutes discussed and encouraged, as a part of hypertension management. The importance of attaining a healthy weight is also discussed.  

## 2013-10-23 NOTE — Assessment & Plan Note (Signed)
The increased risk of cardiovascular disease associated with this diagnosis, and the need to consistently work on lifestyle to change this is discussed. Following  a  heart healthy diet ,commitment to 30 minutes of exercise at least 5 days per week, as well as control of blood sugar and cholesterol , and achieving a healthy weight are all the areas to be addressed .  

## 2013-10-23 NOTE — Assessment & Plan Note (Signed)
Sleep hygiene reviewed and written information offered also. Prescription sent for  medication needed. Less dependent on medication

## 2013-10-23 NOTE — Progress Notes (Signed)
Subjective:    Patient ID: SAYRE MAZOR, female    DOB: 01-Feb-1946, 68 y.o.   MRN: [id number]  HPI The PT is here for follow up and re-evaluation of chronic medical conditions, medication management and review of any available recent lab and radiology data.  Preventive health is updated, specifically  Cancer screening and Immunization.   Has been followed by GI for C diff colitis which is finally cured, had been extremely ill requiring hospitalization The PT denies any adverse reactions to current medications since the last visit. She is statin intolerant and lipids have markedly increased despite relatively low fat diet No specific complaints       Review of Systems See HPI Denies recent fever or chills. Denies sinus pressure, nasal congestion, ear pain or sore throat. Denies chest congestion, productive cough or wheezing. Denies chest pains, palpitations and leg swelling Denies abdominal pain, nausea, vomiting,diarrhea or constipation.   Denies dysuria, frequency, hesitancy or incontinence. Intermittent knee pain, relieved with ibuprofen, no significant swelling and limitation in mobility. Denies headaches, seizures, numbness, or tingling. Denies depression, anxiety or insomnia. Denies skin break down or rash.        Objective:   Physical Exam  BP 130/74  Pulse 76  Resp 16  Wt 196 lb 1.9 oz (88.959 kg)  SpO2 98% Patient alert and oriented and in no cardiopulmonary distress.  HEENT: No facial asymmetry, EOMI, no sinus tenderness,  oropharynx pink and moist.  Neck supple no adenopathy.  Chest: Clear to auscultation bilaterally.  CVS: S1, S2 no murmurs, no S3.  ABD: Soft non tender. Bowel sounds normal.  Ext: No edema  MS: Adequate ROM spine, shoulders, hips and knees.  Skin: Intact, no ulcerations or rash noted.  Psych: Good eye contact, normal affect. Memory intact not anxious or depressed appearing.  CNS: CN 2-12 intact, power, tone and sensation  normal throughout.       Assessment & Plan:  HYPERTENSION Controlled, no change in medication DASH diet and commitment to daily physical activity for a minimum of 30 minutes discussed and encouraged, as a part of hypertension management. The importance of attaining a healthy weight is also discussed.   Prediabetes Unchanged since January. Patient educated about the importance of limiting  Carbohydrate intake , the need to commit to daily physical activity for a minimum of 30 minutes , and to commit weight loss. The fact that changes in all these areas will reduce or eliminate all together the development of diabetes is stressed.   Updated lab needed at/ before next visit.   OBESITY Deteriorated. Patient re-educated about  the importance of commitment to a  minimum of 150 minutes of exercise per week. The importance of healthy food choices with portion control discussed. Encouraged to start a food diary, count calories and to consider  joining a support group. Sample diet sheets offered. Goals set by the patient for the next several months.     HYPERLIPIDEMIA Deteriorated, trial of zetia, pt is statin intolerant. Hyperlipidemia:Low fat diet discussed and encouraged.    Insomnia Sleep hygiene reviewed and written information offered also. Prescription sent for  medication needed. Less dependent on medication  Metabolic syndrome X The increased risk of cardiovascular disease associated with this diagnosis, and the need to consistently work on lifestyle to change this is discussed. Following  a  heart healthy diet ,commitment to 30 minutes of exercise at least 5 days per week, as well as control of blood sugar and cholesterol ,  and achieving a healthy weight are all the areas to be addressed .   Goiter, nontoxic, multinodular Multiple nodules seen in 2004, needs updated image, had held off when sick needs to be reassesed  Osteoarthritis of knee Uses ibuprofen sparingly as  needed, also glucosamine

## 2013-10-23 NOTE — Assessment & Plan Note (Signed)
Deteriorated, trial of zetia, pt is statin intolerant. Hyperlipidemia:Low fat diet discussed and encouraged.

## 2013-10-23 NOTE — Assessment & Plan Note (Signed)
Unchanged since January. Patient educated about the importance of limiting  Carbohydrate intake , the need to commit to daily physical activity for a minimum of 30 minutes , and to commit weight loss. The fact that changes in all these areas will reduce or eliminate all together the development of diabetes is stressed.   Updated lab needed at/ before next visit.

## 2013-10-23 NOTE — Assessment & Plan Note (Signed)
Uses ibuprofen sparingly as needed, also glucosamine

## 2013-10-23 NOTE — Patient Instructions (Addendum)
F/u  in 4 month, call if you need me before  Dexa will be scheduled at next visit,per your request   Fasting lipid, cmp and HBa1C in 4 month  Blood pressure and exam are good  New medication is zetia for cholesterol, call if too expensive , we will also PA  Cut back on sugar and fattty food, increase fresh/dfrozen fruit and veg and water and commit to daily exercise for health of 30 minutes  Metabolic Syndrome, Adult Metabolic syndrome descibes a group of risk factors for heart disease and diabetes. This syndrome has other names including Insulin Resistance Syndrome. The more risk factors you have, the higher your risk of having a heart attack, stroke, or developing diabetes. These risk factors include:  High blood sugar.  High blood triglyceride (a fat found in the blood) level.  High blood pressure.  Abdominal obesity (your extra weight is around your waist instead of your hips).  Low levels of high-density lipoprotein, HDL (good blood cholesterol). If you have any three of these risk factors, you have metabolic syndrome. If you have even one of these factors, you should make lifestyle changes to improve your health in order to prevent serious health diseases.  In people with metabolic syndrome, the cells do not respond properly to insulin. This can lead to high levels of glucose in the blood, which can interfere with normal body processes. Eventually, this can cause high blood pressure and higher fat levels in the blood, and inflammation of your blood vessels. The result can be heart disease and stroke.  CAUSES   Eating a diet rich in calories and saturated fat.  Too little physical activity.  Being overweight. Other underlying causes are:  Family history (genetics).  Ethnicity (South Asians are at a higher risk).  Older age (your chances of developing metabolic syndrome are higher as you grow older).  Insulin resistance. SYMPTOMS  By itself, metabolic syndrome has no  symptoms. However, you might have symptoms of diabetes (high blood sugar) or high blood pressure, such as:  Increased thirst, urination, and tiredness.  Dizzy spells.  Dull headaches that are unusual for you.  Blurred vision.  Nosebleeds. DIAGNOSIS  Your caregiver may make a diagnosis of metabolic syndrome if you have at least three of these factors:  If you are overweight mostly around the waist. This means a waistline greater than 40" in men and more than 35" in women. The waistline limits are 31 to 35 inches for women and 37 to 39 inches for men. In those who have certain genetic risk factors, such as having a family history of diabetes or being of Asian descent.  If you have a blood pressure of 130/85 mm Hg or more, or if you are being treated for high blood pressure.  If your blood triglyceride level is 150 mg/dL or more, or you are being treated for high levels of triglyceride.  If the level of HDL in your blood is below 40 mg/dL in men, less than 50 mg/dL in women, or you are receiving treatment for low levels of HDL.  If the level of sugar in your blood is high with fasting blood sugar level of 110 mg/dL or more, or you are under treatment for diabetes. TREATMENT  Your caregiver may have you make lifestyle changes, which may include:  Exercise.  Losing weight.  Maintaining a healthy diet.  Quitting smoking. The lifestyle changes listed above are key in reducing your risk for heart disease and stroke. Medicines  may also be prescribed to help your body respond to insulin better and to reduce your blood pressure and blood fat levels. Aspirin may be recommended to reduce risks of heart disease or stroke.  HOME CARE INSTRUCTIONS   Exercise.  Measure your waist at regular intervals just above the hipbones after you have breathed out.  Maintain a healthy diet.  Eat fruits, such as apples, oranges, and pears.  Eat vegetables.  Eat legumes, such as kidney beans, peas, and  lentils.  Eat food rich in soluble fiber, such as whole grain cereal, oatmeal, and oat bran.  Use olive or safflower oils and avoid saturated fats.  Eat nuts.  Limit the amount of salt you eat or add to food.  Limit the amount of alcohol you drink.  Include fish in your diet, if possible.  Stop smoking if you are a smoker.  Maintain regular follow-up appointments.  Follow your caregiver's advice. SEEK MEDICAL CARE IF:   You feel very tired or fatigued.  You develop excessive thirst.  You pass large quantities of urine.  You are putting on weight around your waist rather than losing weight.  You develop headaches over and over again.  You have off-and-on dizzy spells. SEEK IMMEDIATE MEDICAL CARE IF:   You develop nosebleeds.  You develop sudden blurred vision.  You develop sudden dizzy spells.  You develop chest pains, trouble breathing, or feel an abnormal or irregular heart beat.  You have a fainting episode.  You develop any sudden trouble speaking and/or swallowing.  You develop sudden weakness in one arm and/or one leg. MAKE SURE YOU:   Understand these instructions.  Will watch your condition.  Will get help right away if you are not doing well or get worse. Document Released: 09/05/2007 Document Revised: 08/21/2011 Document Reviewed: 09/05/2007 [facility] Patient Information 2014 [place], [place].

## 2013-10-23 NOTE — Assessment & Plan Note (Signed)
Multiple nodules seen in 2004, needs updated image, had held off when sick needs to be reassesed

## 2013-11-12 NOTE — Progress Notes (Deleted)
Karen [name] presents today in follow-up for Cdiff colitis. Hospitalized early Feb with Cdiff colitis, dehydration, acute renal failure. Sent home with Vancomycin 125 mg po QID for total of 10 days. Repeat Cdiff through PCP was positive; however, the same day Cdiff was noted as negative in the ED. Recommended by Dr. Gala Romney to complete an additional 2 weeks of oral vanc. Returns today in close follow-up for symptom management. Given an additional 2 weeks of Vanc at appt in March 2015.

## 2013-11-13 ENCOUNTER — Ambulatory Visit: Payer: Medicare HMO | Admitting: Gastroenterology

## 2013-11-14 NOTE — Progress Notes (Signed)
Cancelled appt

## 2014-01-24 LAB — HEMOGLOBIN A1C
Hgb A1c MFr Bld: 5.7 % — ABNORMAL HIGH (ref <5.7)
Mean Plasma Glucose: 117 mg/dL — ABNORMAL HIGH (ref <117)

## 2014-01-24 LAB — LIPID PANEL
CHOL/HDL RATIO: 3.6 ratio
CHOLESTEROL: 178 mg/dL (ref 0–200)
HDL: 50 mg/dL (ref >39)
LDL CALC: 119 mg/dL — AB (ref 0–99)
Triglycerides: 44 mg/dL (ref <150)
VLDL: 9 mg/dL (ref 0–40)

## 2014-01-24 LAB — COMPREHENSIVE METABOLIC PANEL
ALT: 16 U/L (ref 0–35)
AST: 18 U/L (ref 0–37)
Albumin: 4.1 g/dL (ref 3.5–5.2)
Alkaline Phosphatase: 55 U/L (ref 39–117)
BILIRUBIN TOTAL: 0.7 mg/dL (ref 0.2–1.2)
BUN: 19 mg/dL (ref 6–23)
CO2: 28 meq/L (ref 19–32)
Calcium: 9.6 mg/dL (ref 8.4–10.5)
Chloride: 102 mEq/L (ref 96–112)
Creat: 0.77 mg/dL (ref 0.50–1.10)
GLUCOSE: 95 mg/dL (ref 70–99)
Potassium: 4.6 mEq/L (ref 3.5–5.3)
SODIUM: 137 meq/L (ref 135–145)
TOTAL PROTEIN: 6.9 g/dL (ref 6.0–8.3)

## 2014-01-28 ENCOUNTER — Encounter: Payer: Self-pay | Admitting: Family Medicine

## 2014-01-28 ENCOUNTER — Ambulatory Visit (INDEPENDENT_AMBULATORY_CARE_PROVIDER_SITE_OTHER): Payer: Medicare HMO | Admitting: Family Medicine

## 2014-01-28 VITALS — BP 130/78 | HR 73 | Resp 16 | Ht 66.5 in | Wt 197.8 lb

## 2014-01-28 DIAGNOSIS — E8881 Metabolic syndrome: Secondary | ICD-10-CM

## 2014-01-28 DIAGNOSIS — G4700 Insomnia, unspecified: Secondary | ICD-10-CM

## 2014-01-28 DIAGNOSIS — I10 Essential (primary) hypertension: Secondary | ICD-10-CM

## 2014-01-28 DIAGNOSIS — E785 Hyperlipidemia, unspecified: Secondary | ICD-10-CM

## 2014-01-28 DIAGNOSIS — E669 Obesity, unspecified: Secondary | ICD-10-CM

## 2014-01-28 DIAGNOSIS — Z23 Encounter for immunization: Secondary | ICD-10-CM | POA: Insufficient documentation

## 2014-01-28 DIAGNOSIS — R7303 Prediabetes: Secondary | ICD-10-CM

## 2014-01-28 DIAGNOSIS — R7309 Other abnormal glucose: Secondary | ICD-10-CM

## 2014-01-28 MED ORDER — SIMVASTATIN 10 MG PO TABS: 10.0000 mg | ORAL_TABLET | Freq: Every day | ORAL | Status: DC

## 2014-01-28 MED ORDER — LISINOPRIL-HYDROCHLOROTHIAZIDE 20-25 MG PO TABS: ORAL_TABLET | ORAL | Status: DC

## 2014-01-28 NOTE — Assessment & Plan Note (Signed)
The increased risk of cardiovascular disease associated with this diagnosis, and the need to consistently work on lifestyle to change this is discussed. Following  a  heart healthy diet ,commitment to 30 minutes of exercise at least 5 days per week, as well as control of blood sugar and cholesterol , and achieving a healthy weight are all the areas to be addressed .  

## 2014-01-28 NOTE — Assessment & Plan Note (Signed)
Improved, uses restoril on avg twice weekly

## 2014-01-28 NOTE — Progress Notes (Signed)
Subjective:    Patient ID: Karen [name], female    DOB: 07/23/1945, 68 y.o.   MRN: [id number]  HPI The PT is here for follow up and re-evaluation of chronic medical conditions, medication management and review of any available recent lab and radiology data.  Preventive health is updated, specifically  Cancer screening and Immunization.   Questions or concerns regarding consultations or procedures which the PT has had in the interim are  addressed. The PT denies any adverse reactions to current medications since the last visit.  There are no new concerns.  There are no specific complaints       Review of Systems See HPI Denies recent fever or chills. Denies sinus pressure, nasal congestion, ear pain or sore throat. Denies chest congestion, productive cough or wheezing. Denies chest pains, palpitations and leg swelling Denies abdominal pain, nausea, vomiting,diarrhea or constipation.   Denies dysuria, frequency, hesitancy or incontinence. Denies joint pain, swelling and limitation in mobility. Denies headaches, seizures, numbness, or tingling. Denies depression, anxiety or insomnia. Denies skin break down or rash.        Objective:   Physical Exam  BP 130/78  Pulse 73  Resp 16  Ht 5' 6.5" (1.689 m)  Wt 197 lb 12.8 oz (89.721 kg)  BMI 31.45 kg/m2  SpO2 98% Patient alert and oriented and in no cardiopulmonary distress.  HEENT: No facial asymmetry, EOMI,   oropharynx pink and moist.  Neck supple no JVD, no mass.  Chest: Clear to auscultation bilaterally.  CVS: S1, S2 no murmurs, no S3.Regular rate.  ABD: Soft non tender.   Ext: No edema  MS: Adequate ROM spine, shoulders, hips and knees.  Skin: Intact, no ulcerations or rash noted.  Psych: Good eye contact, normal affect. Memory intact not anxious or depressed appearing.  CNS: CN 2-12 intact, power,  normal throughout.no focal deficits noted.       Assessment & Plan:  Essential hypertension,  benign .Controlled, no change in medication DASH diet and commitment to daily physical activity for a minimum of 30 minutes discussed and encouraged, as a part of hypertension management. The importance of attaining a healthy weight is also discussed.   Prediabetes Improved, pt congratulated on this Patient educated about the importance of limiting  Carbohydrate intake , the need to commit to daily physical activity for a minimum of 30 minutes , and to commit weight loss. The fact that changes in all these areas will reduce or eliminate all together the development of diabetes is stressed.     HYPERLIPIDEMIA Improved Hyperlipidemia:Low fat diet discussed and encouraged.  Will transition to low dose zocor on completion of current zetia  OBESITY Unchanged Patient re-educated about  the importance of commitment to a  minimum of 150 minutes of exercise per week. The importance of healthy food choices with portion control discussed. Encouraged to start a food diary, count calories and to consider  joining a support group. Sample diet sheets offered. Goals set by the patient for the next several months.     Metabolic syndrome X The increased risk of cardiovascular disease associated with this diagnosis, and the need to consistently work on lifestyle to change this is discussed. Following  a  heart healthy diet ,commitment to 30 minutes of exercise at least 5 days per week, as well as control of blood sugar and cholesterol , and achieving a healthy weight are all the areas to be addressed .   Insomnia Improved, uses restoril on avg  twice weekly

## 2014-01-28 NOTE — Assessment & Plan Note (Signed)
Improved, pt congratulated on this Patient educated about the importance of limiting  Carbohydrate intake , the need to commit to daily physical activity for a minimum of 30 minutes , and to commit weight loss. The fact that changes in all these areas will reduce or eliminate all together the development of diabetes is stressed.

## 2014-01-28 NOTE — Assessment & Plan Note (Signed)
Controlled, no change in medication DASH diet and commitment to daily physical activity for a minimum of 30 minutes discussed and encouraged, as a part of hypertension management. The importance of attaining a healthy weight is also discussed.  

## 2014-01-28 NOTE — Patient Instructions (Addendum)
F/u end December, call if you need me before  Plan on flu vaccine in October here  Excellent labs and blood pressure, weight also is doing well  Pls continue low fat reduced carb diet  Print out of labs will be given to you  After you finish current zetia, NO MORE, start zocor 10 mg daily (or use up your zocor 20mg  HALF daily , if you can split accurately)  Non fasting hepatic panel 2nd week in October  Fasting lipid, cmp and HBa1C in December before f/u

## 2014-01-28 NOTE — Assessment & Plan Note (Signed)
Improved Hyperlipidemia:Low fat diet discussed and encouraged.  Will transition to low dose zocor on completion of current zetia

## 2014-01-28 NOTE — Assessment & Plan Note (Signed)
Unchanged. Patient re-educated about  the importance of commitment to a  minimum of 150 minutes of exercise per week. The importance of healthy food choices with portion control discussed. Encouraged to start a food diary, count calories and to consider  joining a support group. Sample diet sheets offered. Goals set by the patient for the next several months.    

## 2014-02-26 ENCOUNTER — Ambulatory Visit: Payer: Medicare HMO | Admitting: Family Medicine

## 2014-03-23 ENCOUNTER — Ambulatory Visit (INDEPENDENT_AMBULATORY_CARE_PROVIDER_SITE_OTHER): Payer: Medicare HMO

## 2014-03-23 DIAGNOSIS — Z23 Encounter for immunization: Secondary | ICD-10-CM

## 2014-03-28 LAB — HEMOGLOBIN A1C
Hgb A1c MFr Bld: 5.6 % (ref <5.7)
Mean Plasma Glucose: 114 mg/dL (ref <117)

## 2014-03-29 LAB — COMPREHENSIVE METABOLIC PANEL
ALK PHOS: 64 U/L (ref 39–117)
ALT: 15 U/L (ref 0–35)
AST: 17 U/L (ref 0–37)
Albumin: 3.8 g/dL (ref 3.5–5.2)
BUN: 19 mg/dL (ref 6–23)
CO2: 26 mEq/L (ref 19–32)
CREATININE: 0.82 mg/dL (ref 0.50–1.10)
Calcium: 9.7 mg/dL (ref 8.4–10.5)
Chloride: 105 mEq/L (ref 96–112)
Glucose, Bld: 78 mg/dL (ref 70–99)
Potassium: 4.1 mEq/L (ref 3.5–5.3)
Sodium: 138 mEq/L (ref 135–145)
Total Bilirubin: 0.6 mg/dL (ref 0.2–1.2)
Total Protein: 6.6 g/dL (ref 6.0–8.3)

## 2014-03-29 LAB — HEPATIC FUNCTION PANEL
ALT: 15 U/L (ref 0–35)
AST: 17 U/L (ref 0–37)
Albumin: 3.9 g/dL (ref 3.5–5.2)
Alkaline Phosphatase: 63 U/L (ref 39–117)
BILIRUBIN DIRECT: 0.1 mg/dL (ref 0.0–0.3)
BILIRUBIN INDIRECT: 0.5 mg/dL (ref 0.2–1.2)
BILIRUBIN TOTAL: 0.6 mg/dL (ref 0.2–1.2)
Total Protein: 6.7 g/dL (ref 6.0–8.3)

## 2014-03-29 LAB — LIPID PANEL
CHOL/HDL RATIO: 3.9 ratio
Cholesterol: 174 mg/dL (ref 0–200)
HDL: 45 mg/dL (ref >39)
LDL CALC: 117 mg/dL — AB (ref 0–99)
TRIGLYCERIDES: 58 mg/dL (ref <150)
VLDL: 12 mg/dL (ref 0–40)

## 2014-04-13 ENCOUNTER — Encounter: Payer: Self-pay | Admitting: Family Medicine

## 2014-05-25 ENCOUNTER — Other Ambulatory Visit: Payer: Self-pay | Admitting: Family Medicine

## 2014-05-25 DIAGNOSIS — Z1231 Encounter for screening mammogram for malignant neoplasm of breast: Secondary | ICD-10-CM

## 2014-05-28 ENCOUNTER — Ambulatory Visit (HOSPITAL_COMMUNITY)
Admission: RE | Admit: 2014-05-28 | Discharge: 2014-05-28 | Disposition: A | Discharge status: Home / Self Care | Payer: Commercial Managed Care - HMO | Source: Ambulatory Visit | Attending: Family Medicine | Admitting: Family Medicine

## 2014-05-28 DIAGNOSIS — Z1231 Encounter for screening mammogram for malignant neoplasm of breast: Secondary | ICD-10-CM | POA: Insufficient documentation

## 2014-05-28 IMAGING — MG MM DIGITAL SCREENING
4 series · 4 of 4 positions shown · non-contrast
Comparison: Previous exam(s).

CLINICAL DATA: Screening.

EXAM:
DIGITAL SCREENING BILATERAL MAMMOGRAM WITH CAD

[L CC]
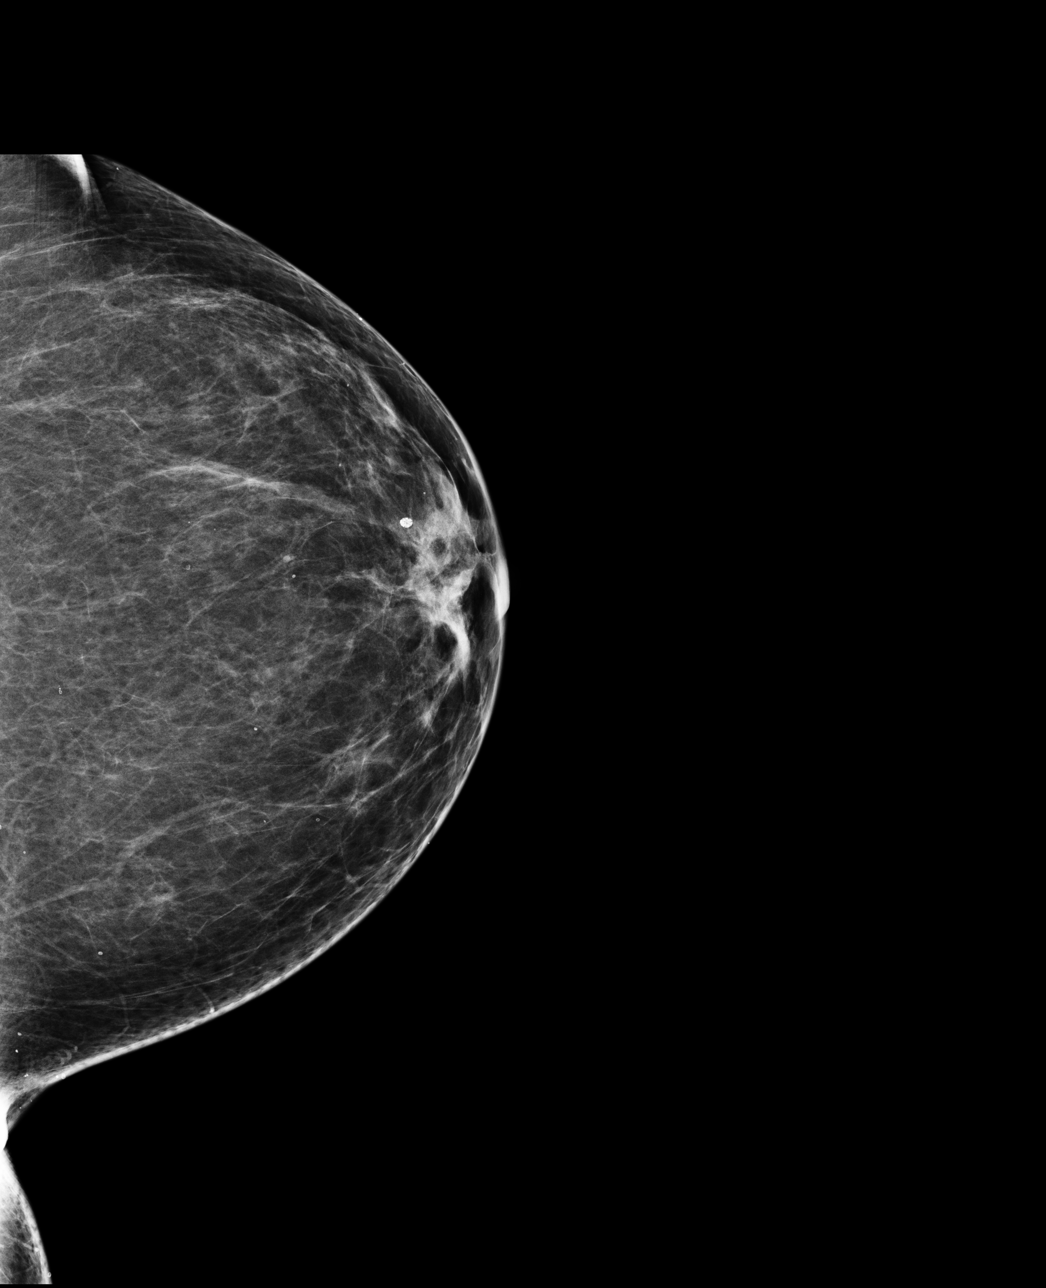

[L MLO]
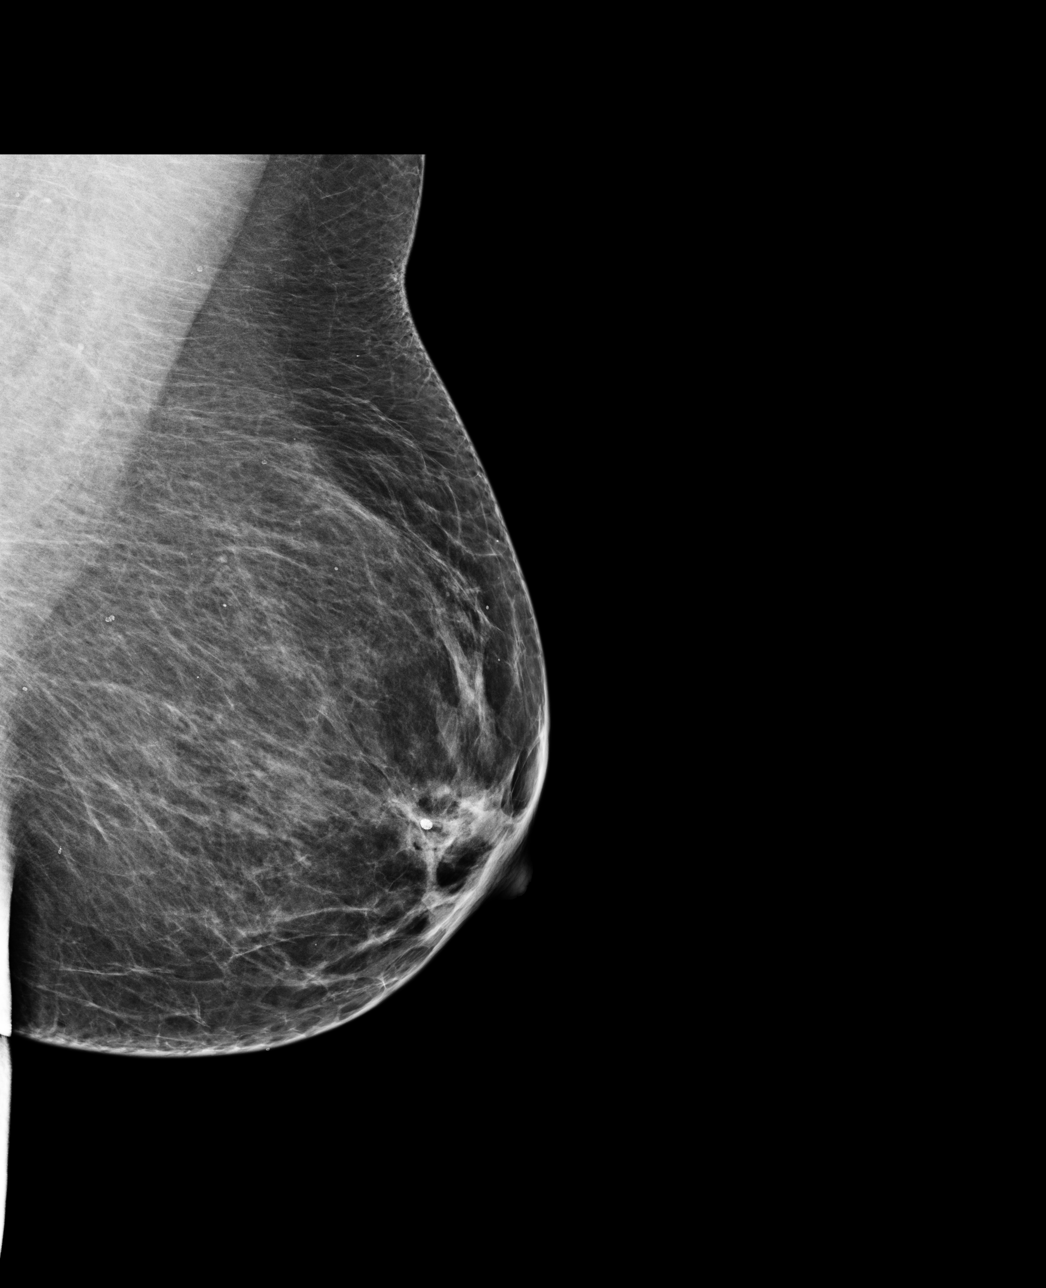

[R CC]
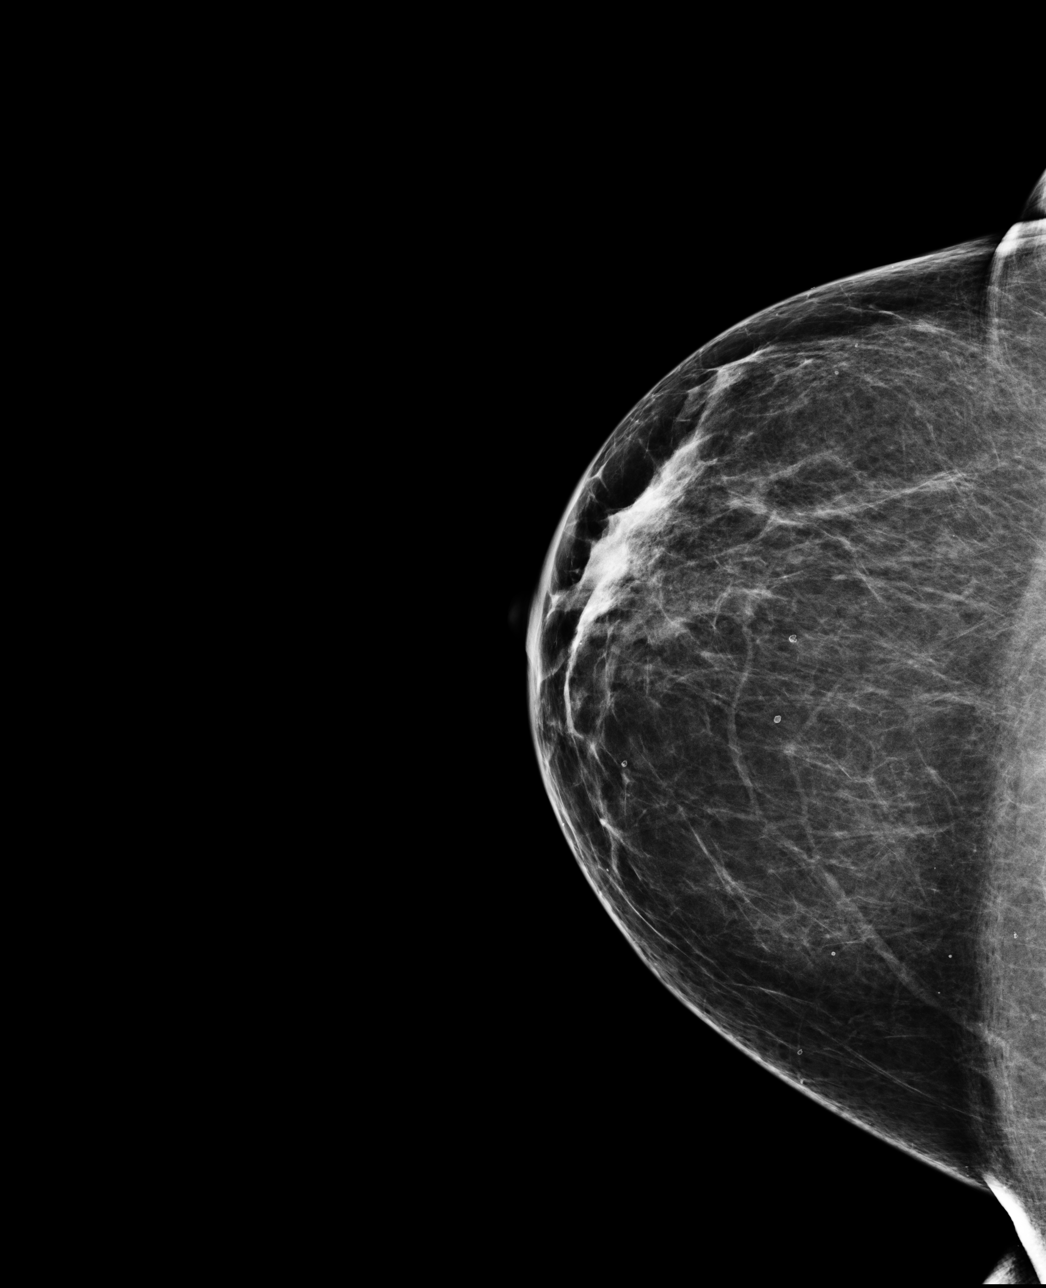

[R MLO]
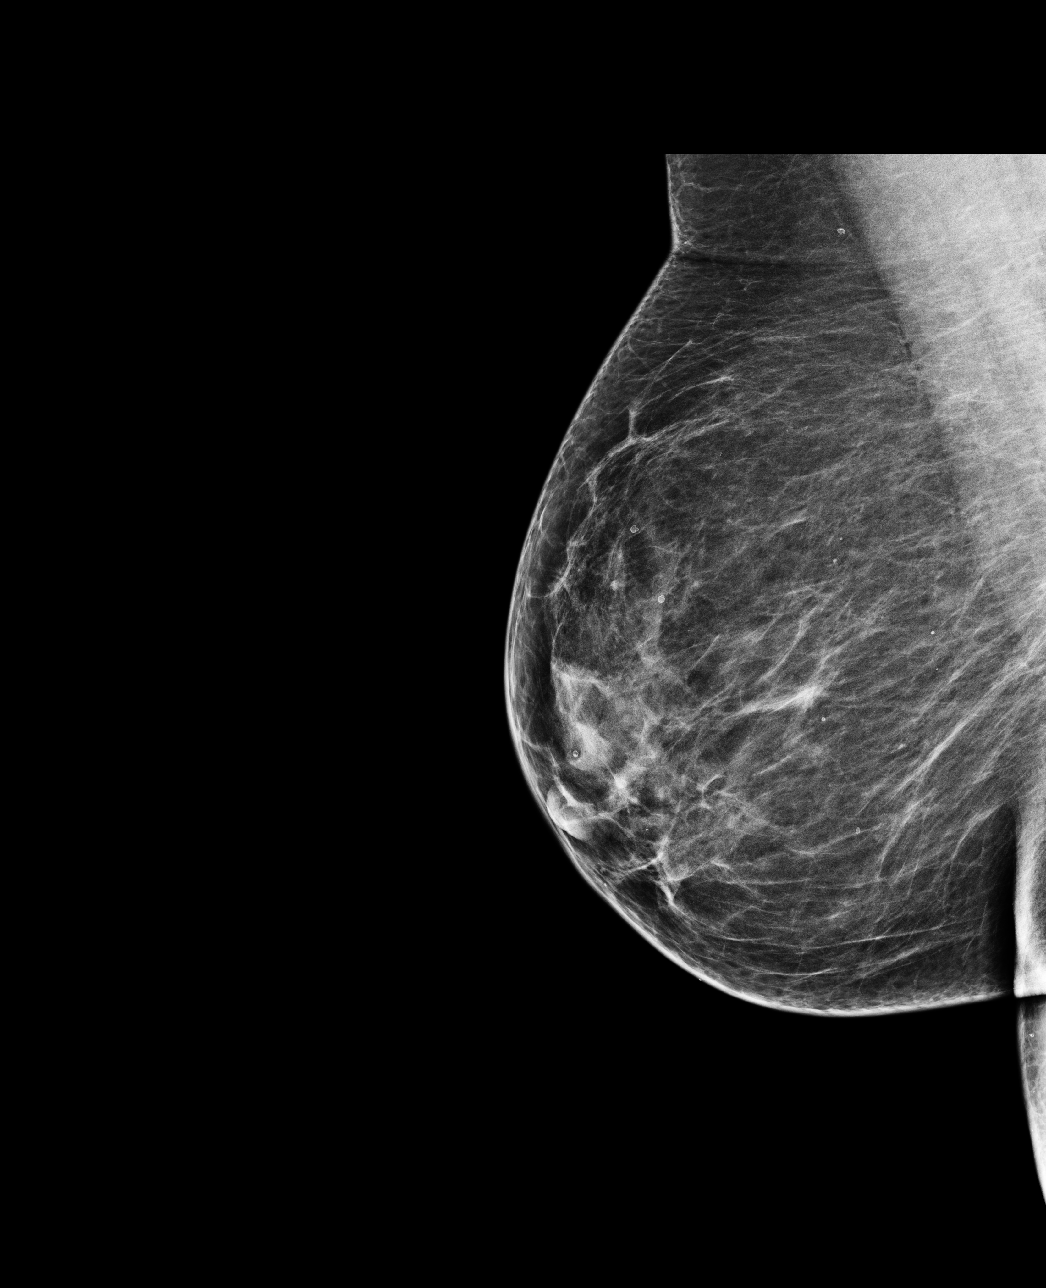

[4 of 4 positions shown; findings below may reference images not displayed]

ACR Breast Density Category b: There are scattered areas of
fibroglandular density.
FINDINGS: There are no findings suspicious for malignancy. Images were
processed with CAD.
IMPRESSION: No mammographic evidence of malignancy. A result letter of this
screening mammogram will be mailed directly to the patient.

RECOMMENDATION:
Screening mammogram in one year. (Code:[US])

BI-RADS CATEGORY  1: Negative.

## 2014-06-02 ENCOUNTER — Ambulatory Visit (INDEPENDENT_AMBULATORY_CARE_PROVIDER_SITE_OTHER): Payer: Medicare HMO | Admitting: Family Medicine

## 2014-06-02 ENCOUNTER — Encounter: Payer: Self-pay | Admitting: Family Medicine

## 2014-06-02 VITALS — BP 126/72 | HR 70 | Resp 18 | Ht 66.5 in | Wt 207.0 lb

## 2014-06-02 DIAGNOSIS — E785 Hyperlipidemia, unspecified: Secondary | ICD-10-CM

## 2014-06-02 DIAGNOSIS — J302 Other seasonal allergic rhinitis: Secondary | ICD-10-CM

## 2014-06-02 DIAGNOSIS — E559 Vitamin D deficiency, unspecified: Secondary | ICD-10-CM

## 2014-06-02 DIAGNOSIS — I10 Essential (primary) hypertension: Secondary | ICD-10-CM

## 2014-06-02 DIAGNOSIS — Z13820 Encounter for screening for osteoporosis: Secondary | ICD-10-CM

## 2014-06-02 DIAGNOSIS — IMO0001 Reserved for inherently not codable concepts without codable children: Secondary | ICD-10-CM

## 2014-06-02 DIAGNOSIS — R7303 Prediabetes: Secondary | ICD-10-CM

## 2014-06-02 DIAGNOSIS — R7309 Other abnormal glucose: Secondary | ICD-10-CM

## 2014-06-02 NOTE — Progress Notes (Signed)
   Subjective:    Patient ID: Karen [name], female    DOB: [birth year], 68 y.o.   MRN: [id number]  HPI The PT is here for follow up and re-evaluation of chronic medical conditions, medication management and review of any available recent lab and radiology data.  Preventive health is updated, specifically  Cancer screening and Immunization.   Questions or concerns regarding consultations or procedures which the PT has had in the interim are  addressed. The PT denies any adverse reactions to current medications since the last visit.  1 week h/o nasal congestionand drainage, no fever or chills, , cough and chest congestion x 4 days, never had fever, chills or body aches     Review of Systems See HPI  Denies chest congestion, productive cough or wheezing. Denies chest pains, palpitations and leg swelling Denies abdominal pain, nausea, vomiting,diarrhea or constipation.   Denies dysuria, frequency, hesitancy or incontinence. Denies joint pain, swelling and limitation in mobility. Denies headaches, seizures, numbness, or tingling. Denies depression, anxiety or insomnia. Denies skin break down or rash.        Objective:   Physical Exam BP 126/72 mmHg  Pulse 70  Resp 18  Ht 5' 6.5" (1.689 m)  Wt 207 lb (93.895 kg)  BMI 32.91 kg/m2  SpO2 98% Patient alert and oriented and in no cardiopulmonary distress.  HEENT: No facial asymmetry, EOMI,   oropharynx pink and moist.  Neck supple no JVD, no mass. NO sinus tenderness,  Mild erythema and edema of nasal mucosa, TM clear bilaterally Chest: adequate air entry , no  crackles , no wheezes  CVS: S1, S2 no murmurs, no S3.Regular rate.  ABD: Soft non tender.   Ext: No edema  MS: Adequate ROM spine, shoulders, hips and knees.  Skin: Intact, no ulcerations or rash noted.  Psych: Good eye contact, normal affect. Memory intact not anxious or depressed appearing.  CNS: CN 2-12 intact, power,  normal throughout.no focal deficits  noted.        Assessment & Plan:  Essential hypertension, benign Controlled, no change in medication DASH diet and commitment to daily physical activity for a minimum of 30 minutes discussed and encouraged, as a part of hypertension management. The importance of attaining a healthy weight is also discussed.   Prediabetes Improved, no  Change in management. Patient educated about the importance of limiting  Carbohydrate intake , the need to commit to daily physical activity for a minimum of 30 minutes , and to commit weight loss. The fact that changes in all these areas will reduce or eliminate all together the development of diabetes is stressed.     Hyperlipidemia LDL goal <100 Uncontrolled Hyperlipidemia:Low fat diet discussed and encouraged.  Updated lab needed at/ before next visit.   Obesity, Class II, BMI 35-39.9, with comorbidity Deteriorated. Patient re-educated about  the importance of commitment to a  minimum of 150 minutes of exercise per week. The importance of healthy food choices with portion control discussed. Encouraged to start a food diary, count calories and to consider  joining a support group. Sample diet sheets offered. Goals set by the patient for the next several months.     Seasonal allergies Uncontrolled. Pt to commit to dailoy use of medciation as wella s intermittent as needed, sudafed and saline nasal flushes

## 2014-06-02 NOTE — Patient Instructions (Addendum)
Annual wellness in 4.5 month, call if you need me before  Make arrangement to return for prevnar in the next 2 to 3 weeks with the nurse please  Please reduce fried and fatty foods and continue to reduce carbs  Fasting lipid and cmp, CBC and TSH and vit D  in 4.5 month, before next visit  You are referred for bone density test  Your symptoms are due mainly to uncontrolled allergies, please commit to daily claritin and OK to take sudafed one daily fro a few days to reduce drainage  Call if symptoms worsen i in the next week please  All the best for 2016  Mammogram is normal

## 2014-06-03 ENCOUNTER — Ambulatory Visit: Payer: Medicare HMO | Admitting: Family Medicine

## 2014-06-03 ENCOUNTER — Telehealth: Payer: Self-pay | Admitting: Family Medicine

## 2014-06-03 NOTE — Telephone Encounter (Signed)
Noted  

## 2014-06-07 DIAGNOSIS — J302 Other seasonal allergic rhinitis: Secondary | ICD-10-CM | POA: Insufficient documentation

## 2014-06-07 NOTE — Assessment & Plan Note (Signed)
Controlled, no change in medication DASH diet and commitment to daily physical activity for a minimum of 30 minutes discussed and encouraged, as a part of hypertension management. The importance of attaining a healthy weight is also discussed.  

## 2014-06-07 NOTE — Assessment & Plan Note (Signed)
Uncontrolled. Pt to commit to dailoy use of medciation as wella s intermittent as needed, sudafed and saline nasal flushes

## 2014-06-07 NOTE — Assessment & Plan Note (Signed)
Uncontrolled. Hyperlipidemia:Low fat diet discussed and encouraged.  Updated lab needed at/ before next visit.  

## 2014-06-07 NOTE — Assessment & Plan Note (Signed)
Improved, no  Change in management. Patient educated about the importance of limiting  Carbohydrate intake , the need to commit to daily physical activity for a minimum of 30 minutes , and to commit weight loss. The fact that changes in all these areas will reduce or eliminate all together the development of diabetes is stressed.

## 2014-06-07 NOTE — Assessment & Plan Note (Signed)
Deteriorated. Patient re-educated about  the importance of commitment to a  minimum of 150 minutes of exercise per week. The importance of healthy food choices with portion control discussed. Encouraged to start a food diary, count calories and to consider  joining a support group. Sample diet sheets offered. Goals set by the patient for the next several months.    

## 2014-06-08 ENCOUNTER — Ambulatory Visit: Payer: Medicare HMO | Admitting: Family Medicine

## 2014-07-16 ENCOUNTER — Ambulatory Visit (INDEPENDENT_AMBULATORY_CARE_PROVIDER_SITE_OTHER): Payer: Commercial Managed Care - HMO

## 2014-07-16 DIAGNOSIS — Z23 Encounter for immunization: Secondary | ICD-10-CM

## 2014-09-03 ENCOUNTER — Other Ambulatory Visit: Payer: Self-pay | Admitting: Family Medicine

## 2014-09-07 ENCOUNTER — Other Ambulatory Visit: Payer: Self-pay | Admitting: Family Medicine

## 2014-10-26 DIAGNOSIS — E559 Vitamin D deficiency, unspecified: Secondary | ICD-10-CM | POA: Diagnosis not present

## 2014-10-26 DIAGNOSIS — E785 Hyperlipidemia, unspecified: Secondary | ICD-10-CM | POA: Diagnosis not present

## 2014-10-26 DIAGNOSIS — I10 Essential (primary) hypertension: Secondary | ICD-10-CM | POA: Diagnosis not present

## 2014-10-26 DIAGNOSIS — R7309 Other abnormal glucose: Secondary | ICD-10-CM | POA: Diagnosis not present

## 2014-10-27 LAB — CBC
HCT: 38.5 % (ref 36.0–46.0)
Hemoglobin: 12.7 g/dL (ref 12.0–15.0)
MCH: 29.8 pg (ref 26.0–34.0)
MCHC: 33 g/dL (ref 30.0–36.0)
MCV: 90.4 fL (ref 78.0–100.0)
MPV: 9.4 fL (ref 9.4–12.4)
PLATELETS: 330 10*3/uL (ref 150–400)
RBC: 4.26 MIL/uL (ref 3.87–5.11)
RDW: 13.5 % (ref 11.5–15.5)
WBC: 4.6 10*3/uL (ref 4.0–10.5)

## 2014-10-27 LAB — COMPREHENSIVE METABOLIC PANEL
ALBUMIN: 3.9 g/dL (ref 3.5–5.2)
ALK PHOS: 74 U/L (ref 39–117)
ALT: 15 U/L (ref 0–35)
AST: 18 U/L (ref 0–37)
BUN: 17 mg/dL (ref 6–23)
CHLORIDE: 103 meq/L (ref 96–112)
CO2: 29 mEq/L (ref 19–32)
Calcium: 9.6 mg/dL (ref 8.4–10.5)
Creat: 0.79 mg/dL (ref 0.50–1.10)
Glucose, Bld: 93 mg/dL (ref 70–99)
Potassium: 4 mEq/L (ref 3.5–5.3)
SODIUM: 137 meq/L (ref 135–145)
Total Bilirubin: 0.7 mg/dL (ref 0.2–1.2)
Total Protein: 7 g/dL (ref 6.0–8.3)

## 2014-10-27 LAB — LIPID PANEL
CHOLESTEROL: 162 mg/dL (ref 0–200)
HDL: 52 mg/dL (ref >=46)
LDL Cholesterol: 98 mg/dL (ref 0–99)
TRIGLYCERIDES: 61 mg/dL (ref <150)
Total CHOL/HDL Ratio: 3.1 Ratio
VLDL: 12 mg/dL (ref 0–40)

## 2014-10-27 LAB — VITAMIN D 25 HYDROXY (VIT D DEFICIENCY, FRACTURES): VIT D 25 HYDROXY: 34 ng/mL (ref 30–100)

## 2014-10-27 LAB — TSH: TSH: 0.866 u[IU]/mL (ref 0.350–4.500)

## 2014-10-29 ENCOUNTER — Encounter: Payer: Commercial Managed Care - HMO | Admitting: Family Medicine

## 2014-12-03 ENCOUNTER — Other Ambulatory Visit: Payer: Self-pay | Admitting: Family Medicine

## 2014-12-08 ENCOUNTER — Ambulatory Visit (INDEPENDENT_AMBULATORY_CARE_PROVIDER_SITE_OTHER): Payer: Commercial Managed Care - HMO | Admitting: Family Medicine

## 2014-12-08 ENCOUNTER — Encounter: Payer: Self-pay | Admitting: Family Medicine

## 2014-12-08 VITALS — BP 130/68 | HR 76 | Resp 18 | Ht 66.5 in | Wt 203.1 lb

## 2014-12-08 DIAGNOSIS — I10 Essential (primary) hypertension: Secondary | ICD-10-CM | POA: Diagnosis not present

## 2014-12-08 DIAGNOSIS — J302 Other seasonal allergic rhinitis: Secondary | ICD-10-CM

## 2014-12-08 DIAGNOSIS — IMO0001 Reserved for inherently not codable concepts without codable children: Secondary | ICD-10-CM

## 2014-12-08 DIAGNOSIS — E785 Hyperlipidemia, unspecified: Secondary | ICD-10-CM | POA: Diagnosis not present

## 2014-12-08 DIAGNOSIS — R7303 Prediabetes: Secondary | ICD-10-CM

## 2014-12-08 DIAGNOSIS — E8881 Metabolic syndrome: Secondary | ICD-10-CM

## 2014-12-08 DIAGNOSIS — Z13820 Encounter for screening for osteoporosis: Secondary | ICD-10-CM

## 2014-12-08 DIAGNOSIS — G4700 Insomnia, unspecified: Secondary | ICD-10-CM

## 2014-12-08 DIAGNOSIS — R7309 Other abnormal glucose: Secondary | ICD-10-CM | POA: Diagnosis not present

## 2014-12-08 MED ORDER — TRIAMTERENE-HCTZ 37.5-25 MG PO TABS: 1.0000 | ORAL_TABLET | Freq: Every day | ORAL | Status: DC

## 2014-12-08 MED ORDER — SIMVASTATIN 10 MG PO TABS: ORAL_TABLET | ORAL | Status: DC

## 2014-12-08 NOTE — Progress Notes (Signed)
Karen [name]     MRN: [id number]      DOB: 09-07-1945   HPI Karen [name] is here for follow up and re-evaluation of chronic medical conditions, medication management and review of any available recent lab and radiology data.  Preventive health is updated, specifically  Cancer screening and Immunization.   Questions or concerns regarding consultations or procedures which the PT has had in the interim are  addressed. The PT denies any adverse reactions to current medications since the last visit.  There are no new concerns.  There are no specific complaints   ROS Denies recent fever or chills. Denies sinus pressure, nasal congestion, ear pain or sore throat. Denies chest congestion, productive cough or wheezing. Denies chest pains, palpitations and leg swelling Denies abdominal pain, nausea, vomiting,diarrhea or constipation.   Denies dysuria, frequency, hesitancy or incontinence. Denies joint pain, swelling and limitation in mobility. Denies headaches, seizures, numbness, or tingling. Denies depression, anxiety or insomnia. Denies skin break down or rash.   PE  BP 130/68 mmHg  Pulse 76  Resp 18  Ht 5' 6.5" (1.689 m)  Wt 203 lb 1.9 oz (92.135 kg)  BMI 32.30 kg/m2  SpO2 98%  Patient alert and oriented and in no cardiopulmonary distress.  HEENT: No facial asymmetry, EOMI,   oropharynx pink and moist.  Neck supple no JVD, no mass.  Chest: Clear to auscultation bilaterally.  CVS: S1, S2 no murmurs, no S3.Regular rate.  ABD: Soft non tender.   Ext: No edema  MS: Adequate ROM spine, shoulders, hips and knees.  Skin: Intact, no ulcerations or rash noted.  Psych: Good eye contact, normal affect. Memory intact not anxious or depressed appearing.  CNS: CN 2-12 intact, power,  normal throughout.no focal deficits noted.   Assessment & Plan   Essential hypertension, benign Controlled,has ACE cough, will change to maxzide, nurse bP re eval in 4 weeks DASH diet and  commitment to daily physical activity for a minimum of 30 minutes discussed and encouraged, as a part of hypertension management. The importance of attaining a healthy weight is also discussed.  BP/Weight 12/08/2014 06/02/2014 01/28/2014 10/23/2013 08/12/2013 08/09/2013 7/84/6962  Systolic BP 952 841 324 401 027 253 -  Diastolic BP 68 72 78 74 65 53 -  Wt. (Lbs) 203.12 207 197.8 196.12 185.6 - 180  BMI 32.3 32.91 31.45 31.18 29.51 - 28.62         Prediabetes Karen [name] is reminded of the importance of commitment to daily physical activity for 30 minutes or more, as able and the need to limit carbohydrate intake to 30 to 60 grams per meal to help with blood sugar control.   The need to take medication as prescribed, test blood sugar as directed, and to call between visits if there is a concern that blood sugar is uncontrolled is also discussed.   Karen [name] is reminded of the importance of daily foot exam, annual eye examination, and good blood sugar, blood pressure and cholesterol control.  Diabetic Labs Latest Ref Rng 10/26/2014 03/28/2014 01/24/2014 10/18/2013 08/08/2013  HbA1c <5.7 % - 5.6 5.7(H) 5.8(H) -  Chol 0 - 200 mg/dL 162 174 178 247(H) -  HDL >=46 mg/dL 52 45 50 45 -  Calc LDL 0 - 99 mg/dL 98 117(H) 119(H) 190(H) -  Triglycerides <150 mg/dL 61 58 44 61 -  Creatinine 0.50 - 1.10 mg/dL 0.79 0.82 0.77 0.79 0.66   BP/Weight 12/08/2014 06/02/2014 01/28/2014 10/23/2013 08/12/2013 08/09/2013 6/64/4034  Systolic  BP 130 126 130 130 732 202 -  Diastolic BP 68 72 78 74 65 53 -  Wt. (Lbs) 203.12 207 197.8 196.12 185.6 - 180  BMI 32.3 32.91 31.45 31.18 29.51 - 28.62   No flowsheet data found.     Updated lab needed at/ before next visit.   Obesity, Class II, BMI 35-39.9, with comorbidity Deteriorated. Patient re-educated about  the importance of commitment to a  minimum of 150 minutes of exercise per week.  The importance of healthy food choices with portion control  discussed. Encouraged to start a food diary, count calories and to consider  joining a support group. Sample diet sheets offered. Goals set by the patient for the next several months.   Weight /BMI 12/08/2014 06/02/2014 01/28/2014  WEIGHT 203 lb 1.9 oz 207 lb 197 lb 12.8 oz  HEIGHT 5' 6.5" 5' 6.5" 5' 6.5"  BMI 32.3 kg/m2 32.91 kg/m2 31.45 kg/m2    Current exercise per week 60 minutes.   Metabolic syndrome X The increased risk of cardiovascular disease associated with this diagnosis, and the need to consistently work on lifestyle to change this is discussed. Following  a  heart healthy diet ,commitment to 30 minutes of exercise at least 5 days per week, as well as control of blood sugar and cholesterol , and achieving a healthy weight are all the areas to be addressed .   Insomnia Markedly  improved, on no meds, living situation has greatly improved which has then allowed improvement in sleep  Seasonal allergies Currently asymptomatic and uses OTC med as needed only

## 2014-12-08 NOTE — Assessment & Plan Note (Signed)
Karen [name] is reminded of the importance of commitment to daily physical activity for 30 minutes or more, as able and the need to limit carbohydrate intake to 30 to 60 grams per meal to help with blood sugar control.   The need to take medication as prescribed, test blood sugar as directed, and to call between visits if there is a concern that blood sugar is uncontrolled is also discussed.   Karen [name] is reminded of the importance of daily foot exam, annual eye examination, and good blood sugar, blood pressure and cholesterol control.  Diabetic Labs Latest Ref Rng 10/26/2014 03/28/2014 01/24/2014 10/18/2013 08/08/2013  HbA1c <5.7 % - 5.6 5.7(H) 5.8(H) -  Chol 0 - 200 mg/dL 162 174 178 247(H) -  HDL >=46 mg/dL 52 45 50 45 -  Calc LDL 0 - 99 mg/dL 98 117(H) 119(H) 190(H) -  Triglycerides <150 mg/dL 61 58 44 61 -  Creatinine 0.50 - 1.10 mg/dL 0.79 0.82 0.77 0.79 0.66   BP/Weight 12/08/2014 06/02/2014 01/28/2014 10/23/2013 08/12/2013 08/09/2013 5/44/9201  Systolic BP 007 121 975 883 254 982 -  Diastolic BP 68 72 78 74 65 53 -  Wt. (Lbs) 203.12 207 197.8 196.12 185.6 - 180  BMI 32.3 32.91 31.45 31.18 29.51 - 28.62   No flowsheet data found.     Updated lab needed at/ before next visit.

## 2014-12-08 NOTE — Patient Instructions (Signed)
Annual wellness in November, call if you need me before  Nurse BP check in 5 weeks  New med for blood pressure since you cough with lisinopril is maxzide one daily, pls start tomorrow ans STOP lisinopril/ HCTZ   You are referred for bone density test  It is important that you exercise regularly at least 30 minutes 7 times a week. If you develop chest pain, have severe difficulty breathing, or feel very tired, stop exercising immediately and seek medical attention   Congrats on excellent labs  Fasting lipid, cmp , HBa1C in November  Thanks for choosing [facility], we consider it a privelige to serve you.

## 2014-12-08 NOTE — Assessment & Plan Note (Signed)
Controlled,has ACE cough, will change to maxzide, nurse bP re eval in 4 weeks DASH diet and commitment to daily physical activity for a minimum of 30 minutes discussed and encouraged, as a part of hypertension management. The importance of attaining a healthy weight is also discussed.  BP/Weight 12/08/2014 06/02/2014 01/28/2014 10/23/2013 08/12/2013 08/09/2013 08/05/1144  Systolic BP 431 427 670 110 034 961 -  Diastolic BP 68 72 78 74 65 53 -  Wt. (Lbs) 203.12 207 197.8 196.12 185.6 - 180  BMI 32.3 32.91 31.45 31.18 29.51 - 28.62

## 2014-12-08 NOTE — Assessment & Plan Note (Signed)
Deteriorated. Patient re-educated about  the importance of commitment to a  minimum of 150 minutes of exercise per week.  The importance of healthy food choices with portion control discussed. Encouraged to start a food diary, count calories and to consider  joining a support group. Sample diet sheets offered. Goals set by the patient for the next several months.   Weight /BMI 12/08/2014 06/02/2014 01/28/2014  WEIGHT 203 lb 1.9 oz 207 lb 197 lb 12.8 oz  HEIGHT 5' 6.5" 5' 6.5" 5' 6.5"  BMI 32.3 kg/m2 32.91 kg/m2 31.45 kg/m2    Current exercise per week 60 minutes.

## 2014-12-08 NOTE — Assessment & Plan Note (Signed)
The increased risk of cardiovascular disease associated with this diagnosis, and the need to consistently work on lifestyle to change this is discussed. Following  a  heart healthy diet ,commitment to 30 minutes of exercise at least 5 days per week, as well as control of blood sugar and cholesterol , and achieving a healthy weight are all the areas to be addressed .  

## 2014-12-08 NOTE — Assessment & Plan Note (Signed)
Currently asymptomatic and uses OTC med as needed only

## 2014-12-08 NOTE — Assessment & Plan Note (Signed)
Markedly  improved, on no meds, living situation has greatly improved which has then allowed improvement in sleep

## 2014-12-09 DIAGNOSIS — H40053 Ocular hypertension, bilateral: Secondary | ICD-10-CM | POA: Diagnosis not present

## 2014-12-09 DIAGNOSIS — H5210 Myopia, unspecified eye: Secondary | ICD-10-CM | POA: Diagnosis not present

## 2014-12-09 DIAGNOSIS — H524 Presbyopia: Secondary | ICD-10-CM | POA: Diagnosis not present

## 2014-12-21 ENCOUNTER — Ambulatory Visit (HOSPITAL_COMMUNITY)
Admission: RE | Admit: 2014-12-21 | Discharge: 2014-12-21 | Disposition: A | Discharge status: Home / Self Care | Payer: Commercial Managed Care - HMO | Source: Ambulatory Visit | Attending: Family Medicine | Admitting: Family Medicine

## 2014-12-21 ENCOUNTER — Encounter: Payer: Self-pay | Admitting: Family Medicine

## 2014-12-21 DIAGNOSIS — Z13820 Encounter for screening for osteoporosis: Secondary | ICD-10-CM | POA: Diagnosis not present

## 2014-12-21 DIAGNOSIS — M8580 Other specified disorders of bone density and structure, unspecified site: Secondary | ICD-10-CM | POA: Insufficient documentation

## 2014-12-21 DIAGNOSIS — Z780 Asymptomatic menopausal state: Secondary | ICD-10-CM | POA: Insufficient documentation

## 2014-12-21 DIAGNOSIS — M810 Age-related osteoporosis without current pathological fracture: Secondary | ICD-10-CM | POA: Diagnosis not present

## 2015-01-06 ENCOUNTER — Telehealth: Payer: Self-pay | Admitting: *Deleted

## 2015-01-06 DIAGNOSIS — I10 Essential (primary) hypertension: Secondary | ICD-10-CM

## 2015-01-06 MED ORDER — TRIAMTERENE-HCTZ 37.5-25 MG PO TABS: 1.0000 | ORAL_TABLET | Freq: Every day | ORAL | Status: DC

## 2015-01-06 NOTE — Telephone Encounter (Signed)
MED SENT 

## 2015-01-06 NOTE — Telephone Encounter (Signed)
Pt called requesting a 90 day supply of her bp medication to [facility] in [place].

## 2015-01-06 NOTE — Telephone Encounter (Signed)
Med sent in.

## 2015-01-12 ENCOUNTER — Ambulatory Visit: Payer: Commercial Managed Care - HMO

## 2015-01-12 VITALS — BP 136/74

## 2015-01-12 DIAGNOSIS — I10 Essential (primary) hypertension: Secondary | ICD-10-CM

## 2015-01-12 NOTE — Progress Notes (Signed)
Patient in for blood pressure f/u after change in medication at last visit.  Blood pressure checked manually.  Normal results.  Patient will continue meds as ordered and keep next scheduled f/u.

## 2015-03-01 ENCOUNTER — Telehealth: Payer: Self-pay | Admitting: *Deleted

## 2015-03-01 ENCOUNTER — Other Ambulatory Visit: Payer: Self-pay

## 2015-03-01 MED ORDER — SIMVASTATIN 10 MG PO TABS
ORAL_TABLET | ORAL | Status: DC
Start: 2015-03-01 — End: 2016-01-26

## 2015-03-01 NOTE — Telephone Encounter (Signed)
Med refilled.

## 2015-03-01 NOTE — Telephone Encounter (Signed)
Patient called stating she needs a 90 day supply of simvastatin sent to the [facility].

## 2015-03-31 ENCOUNTER — Telehealth: Payer: Self-pay | Admitting: *Deleted

## 2015-03-31 NOTE — Telephone Encounter (Signed)
Patient called stating she had c-diff last feb. 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 Moshe Cipro or the nurse about it. Patient has a appt 04/07/15 at 8:00 for a physical, patient wanted to be seen earlier I made her aware we didn't have any openings, [phone]

## 2015-04-01 DIAGNOSIS — E785 Hyperlipidemia, unspecified: Secondary | ICD-10-CM | POA: Diagnosis not present

## 2015-04-01 DIAGNOSIS — R7309 Other abnormal glucose: Secondary | ICD-10-CM | POA: Diagnosis not present

## 2015-04-01 DIAGNOSIS — I10 Essential (primary) hypertension: Secondary | ICD-10-CM | POA: Diagnosis not present

## 2015-04-02 LAB — LIPID PANEL
CHOL/HDL RATIO: 3.4 ratio (ref <=5.0)
Cholesterol: 181 mg/dL (ref 125–200)
HDL: 53 mg/dL (ref >=46)
LDL CALC: 115 mg/dL (ref <130)
TRIGLYCERIDES: 63 mg/dL (ref <150)
VLDL: 13 mg/dL (ref <30)

## 2015-04-02 LAB — COMPREHENSIVE METABOLIC PANEL
ALBUMIN: 4 g/dL (ref 3.6–5.1)
ALK PHOS: 76 U/L (ref 33–130)
ALT: 18 U/L (ref 6–29)
AST: 20 U/L (ref 10–35)
BILIRUBIN TOTAL: 0.8 mg/dL (ref 0.2–1.2)
BUN: 12 mg/dL (ref 7–25)
CALCIUM: 10 mg/dL (ref 8.6–10.4)
CO2: 29 mmol/L (ref 20–31)
Chloride: 102 mmol/L (ref 98–110)
Creat: 0.72 mg/dL (ref 0.50–0.99)
GLUCOSE: 89 mg/dL (ref 65–99)
POTASSIUM: 4 mmol/L (ref 3.5–5.3)
Sodium: 139 mmol/L (ref 135–146)
Total Protein: 7 g/dL (ref 6.1–8.1)

## 2015-04-02 LAB — HEMOGLOBIN A1C
HEMOGLOBIN A1C: 5.9 % — AB (ref <5.7)
Mean Plasma Glucose: 123 mg/dL — ABNORMAL HIGH (ref <117)

## 2015-04-05 NOTE — Telephone Encounter (Signed)
Will keep wed appt

## 2015-04-07 ENCOUNTER — Ambulatory Visit (INDEPENDENT_AMBULATORY_CARE_PROVIDER_SITE_OTHER): Payer: Commercial Managed Care - HMO | Admitting: Family Medicine

## 2015-04-07 ENCOUNTER — Encounter: Payer: Self-pay | Admitting: Family Medicine

## 2015-04-07 VITALS — BP 124/76 | HR 82 | Resp 16 | Ht 66.0 in | Wt 204.0 lb

## 2015-04-07 DIAGNOSIS — Z0000 Encounter for general adult medical examination without abnormal findings: Secondary | ICD-10-CM | POA: Insufficient documentation

## 2015-04-07 DIAGNOSIS — Z23 Encounter for immunization: Secondary | ICD-10-CM

## 2015-04-07 NOTE — Assessment & Plan Note (Signed)

## 2015-04-07 NOTE — Patient Instructions (Signed)
F/ May 21 or after, call if you eed me before    Call for lab order sheet in April, cBC, fastingh lipid, cmp and eGFR, hBa1C and TSH  Please work on good  health habits so that your health will improve. 1. Commitment to daily physical activity for 30 to 60  minutes, if you are able to do this.  2. Commitment to wise food choices. Aim for half of your  food intake to be vegetable and fruit, one quarter starchy foods, and one quarter protein. Try to eat on a regular schedule  3 meals per day, snacking between meals should be limited to vegetables or fruits or small portions of nuts. 64 ounces of water per day is generally recommended, unless you have specific health conditions, like heart failure or kidney failure where you will need to limit fluid intake.  3. Commitment to sufficient and a  good quality of physical and mental rest daily, generally between 6 to 8 hours per day.  WITH PERSISTANCE AND PERSEVERANCE, THE IMPOSSIBLE , BECOMES THE NORM!   Thanks for choosing  Primary Care, we consider it a privelige to serve you.  Flu vaccine today  No med changes    

## 2015-04-07 NOTE — Progress Notes (Signed)
Subjective:    Patient ID: Karen [name], female    DOB: August 26, 1945, 69 y.o.   MRN: [id number]  HPI Preventive Screening-Counseling & Management   Patient present here today for an initial  Medicare annual wellness visit.   Current Problems (verified)   Medications Prior to Visit Allergies (verified)   PAST HISTORY  Family History (verified)   Social History married x 3 children, retired but still working in Education officer, community. Never smoker    Risk Factors  Current exercise habits: walks when able and does arm stretches at home   Dietary issues discussed: heart healthy diet, low carb and low fat  Cardiac risk factors:   Depression Screen  (Note: if answer to either of the following is "Yes", a more complete depression screening is indicated)   Over the past two weeks, have you felt down, depressed or hopeless? No  Over the past two weeks, have you felt little interest or pleasure in doing things? No  Have you lost interest or pleasure in daily life? No  Do you often feel hopeless? No  Do you cry easily over simple problems? No   Activities of Daily Living  In your present state of health, do you have any difficulty performing the following activities?  Driving?: No Managing money?: No Feeding yourself?:No Getting from bed to chair?:No Climbing a flight of stairs?:No Preparing food and eating?:No Bathing or showering?:No Getting dressed?:No Getting to the toilet?:No Using the toilet?:No Moving around from place to place?: No  Fall Risk Assessment In the past year have you fallen or had a near fall?:yes, tripped Are you currently taking any medications that make you dizzy?:No   Hearing Difficulties: No Do you often ask people to speak up or repeat themselves?:No Do you experience ringing or noises in your ears?:No Do you have difficulty understanding soft or whispered voices?:No  Cognitive Testing  Alert? Yes Normal Appearance?Yes  Oriented to person?  Yes Place? Yes  Time? Yes  Displays appropriate judgment?Yes  Can read the correct time from a watch face? yes Are you having problems remembering things?No  Advanced Directives have been discussed with the patient?Yes, no living will. Brochure given     List the Names of Other Physician/Practitioners you currently use:  Dr Gala Romney (GI) Dr Jim Like any recent Medical Services you may have received from other than [facility] providers in the past year (date may be approximate).   Assessment:    Annual Wellness Exam   Plan:     Medicare Attestation  I have personally reviewed:  The patient's medical and social history  Their use of alcohol, tobacco or illicit drugs  Their current medications and supplements  The patient's functional ability including ADLs,fall risks, home safety risks, cognitive, and hearing and visual impairment  Diet and physical activities  Evidence for depression or mood disorders  The patient's weight, height, BMI, and visual acuity have been recorded in the chart. I have made referrals, counseling, and provided education to the patient based on review of the above and I have provided the patient with a written personalized care plan for preventive services.      Review of Systems     Objective:   Physical Exam  BP 124/76 mmHg  Pulse 82  Resp 16  Ht 5\' 6"  (1.676 m)  Wt 204 lb (92.534 kg)  BMI 32.94 kg/m2  SpO2 97%      Assessment & Plan:  Medicare annual wellness visit, initial Annual exam as  documented. Counseling done  re healthy lifestyle involving commitment to 150 minutes exercise per week, heart healthy diet, and attaining healthy weight.The importance of adequate sleep also discussed. Regular seat belt use and home safety, is also discussed. Changes in health habits are decided on by the patient with goals and time frames  set for achieving them. Immunization and cancer screening needs are specifically addressed at this  visit.

## 2015-05-03 ENCOUNTER — Other Ambulatory Visit: Payer: Self-pay | Admitting: Family Medicine

## 2015-05-03 DIAGNOSIS — Z1231 Encounter for screening mammogram for malignant neoplasm of breast: Secondary | ICD-10-CM

## 2015-05-27 ENCOUNTER — Other Ambulatory Visit: Payer: Self-pay | Admitting: Family Medicine

## 2015-05-31 ENCOUNTER — Ambulatory Visit (HOSPITAL_COMMUNITY)
Admission: RE | Admit: 2015-05-31 | Discharge: 2015-05-31 | Disposition: A | Discharge status: Home / Self Care | Payer: Commercial Managed Care - HMO | Source: Ambulatory Visit | Attending: Family Medicine | Admitting: Family Medicine

## 2015-05-31 DIAGNOSIS — Z1231 Encounter for screening mammogram for malignant neoplasm of breast: Secondary | ICD-10-CM | POA: Diagnosis not present

## 2015-05-31 IMAGING — MG MM DIGITAL SCREENING
8 series · 8 of 24 positions shown · non-contrast
Comparison: Previous exam(s).

CLINICAL DATA: Screening.

EXAM:
DIGITAL SCREENING BILATERAL MAMMOGRAM WITH 3D TOMO WITH CAD

[L MLO]
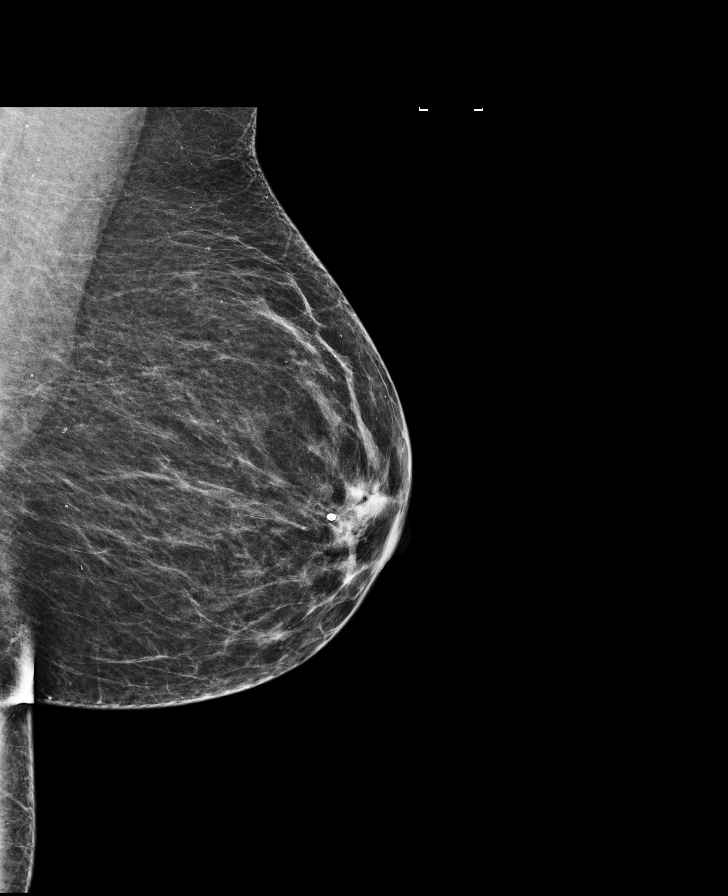

[R MLO]
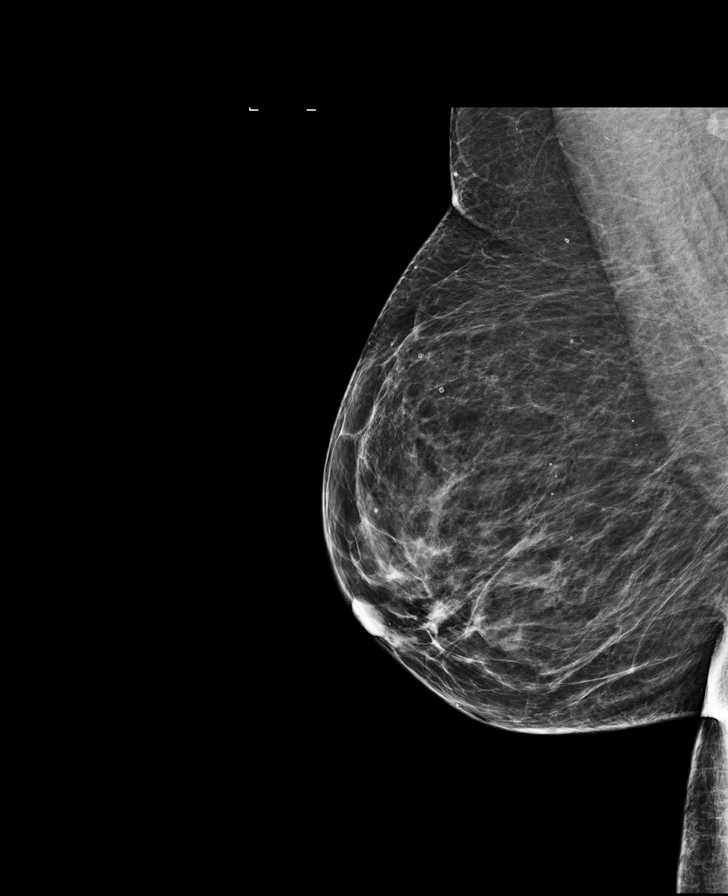

[R CC]
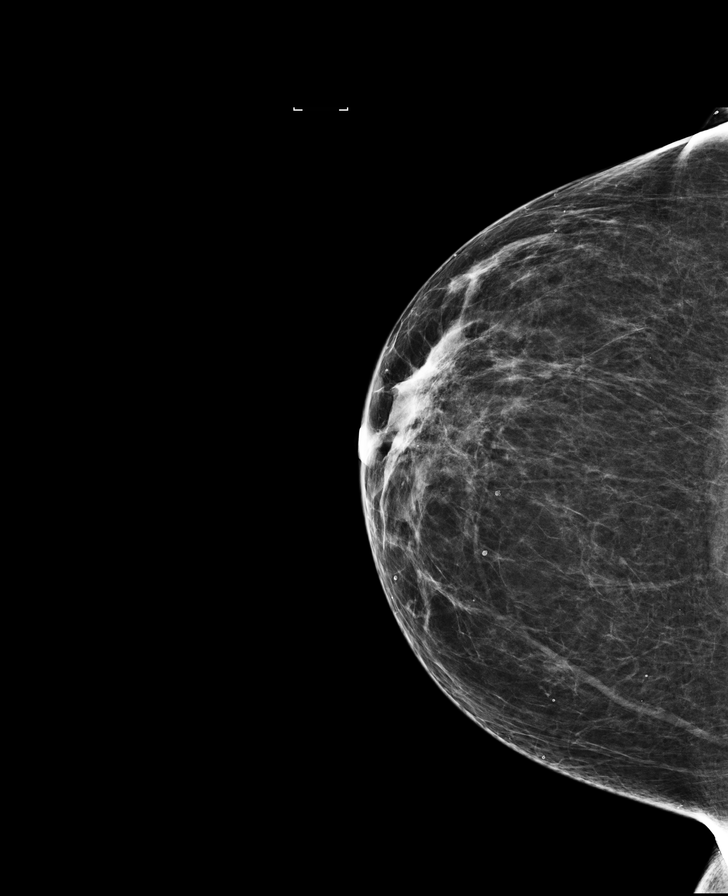

[L CC]
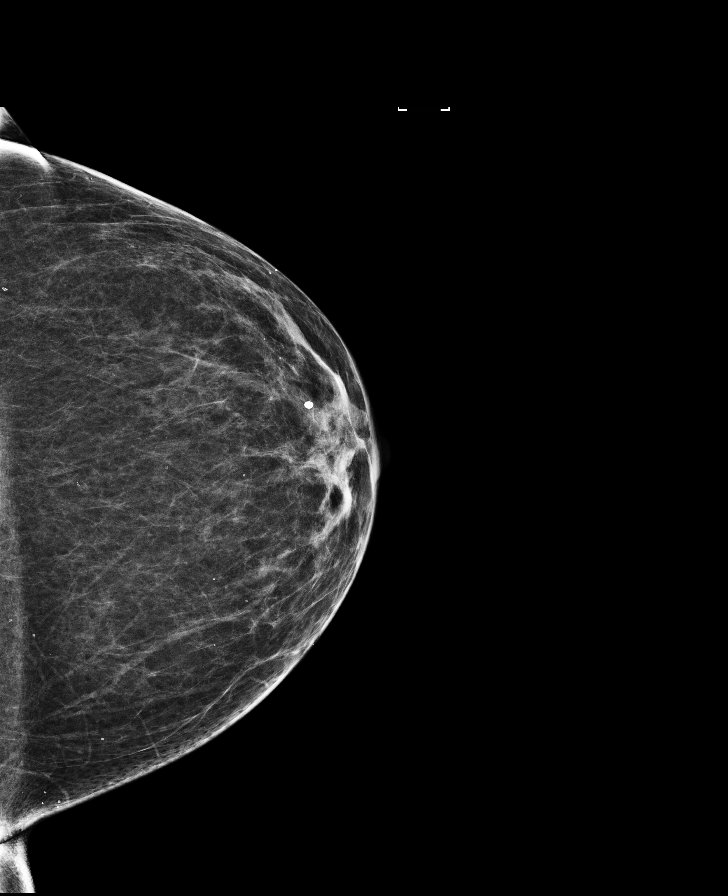

[L CC tomo · tomo slice 31/61.0]
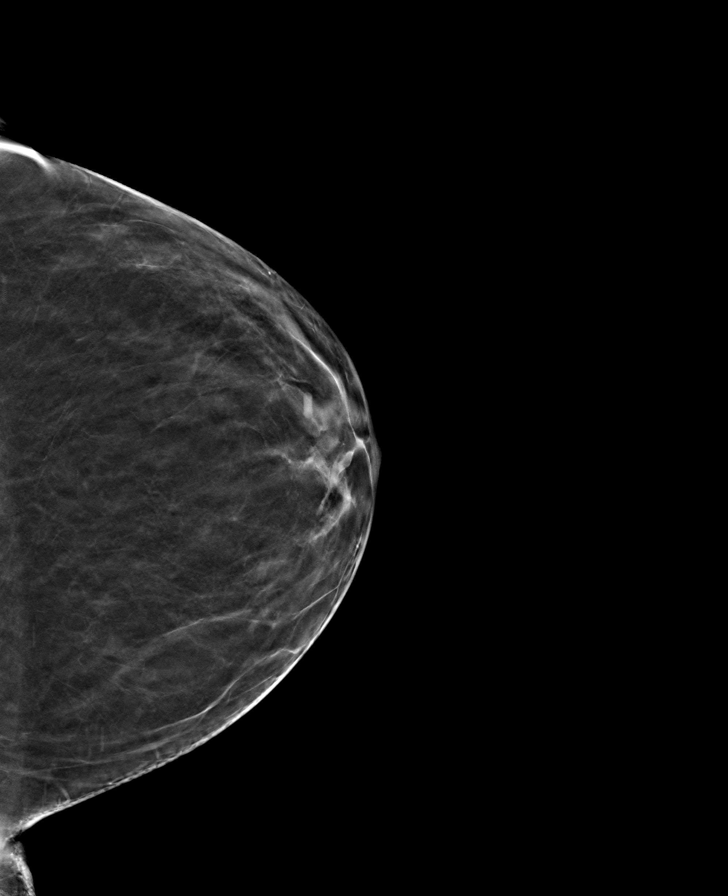

[R CC tomo · tomo slice 30/59.0]
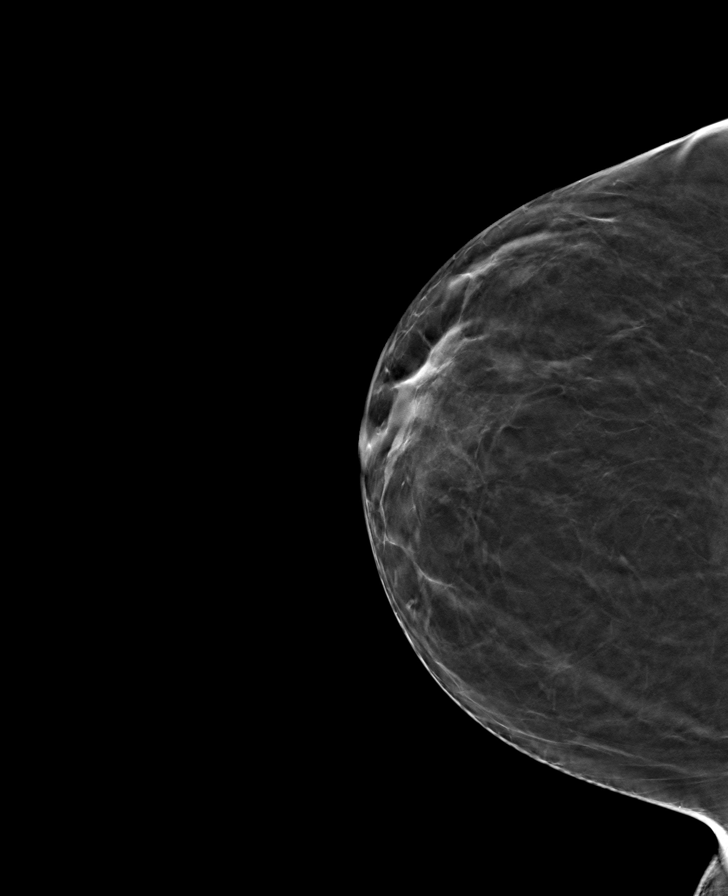

[R MLO tomo · tomo slice 34/67.0]
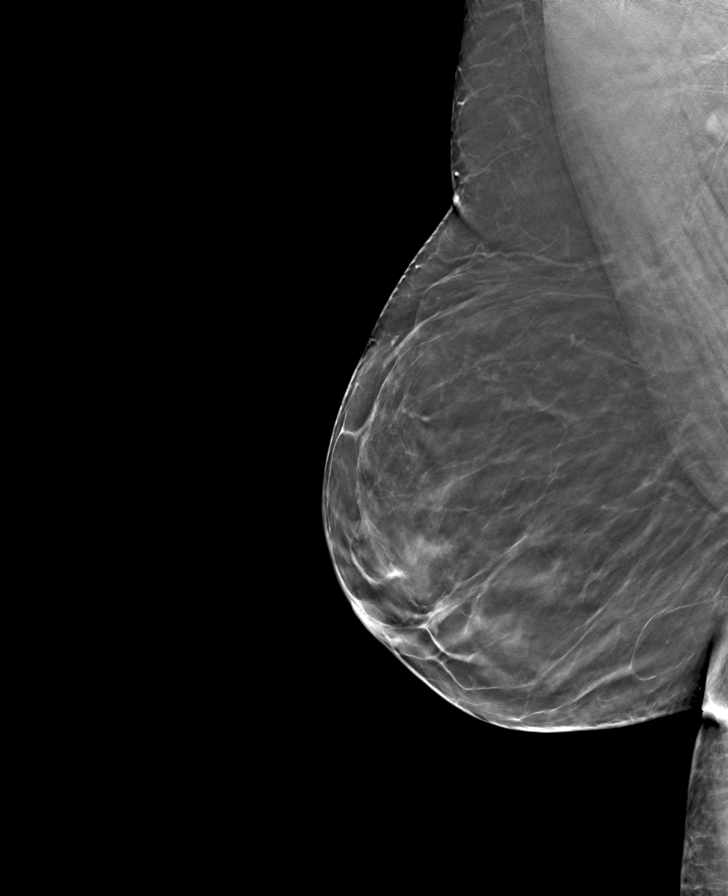

[L MLO tomo · tomo slice 34/67.0]
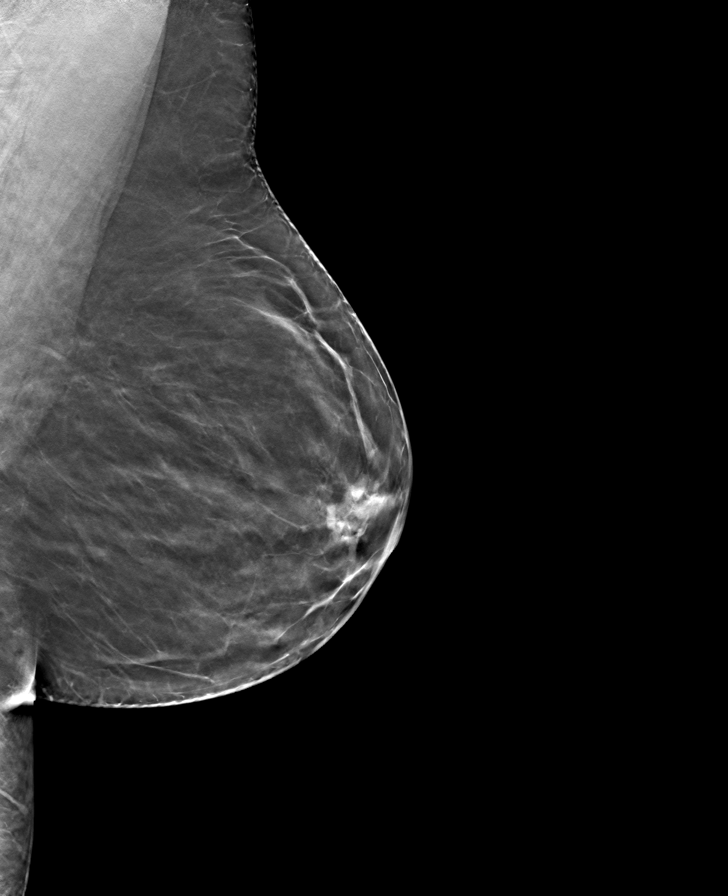

[8 of 24 positions shown; findings below may reference images not displayed]

ACR Breast Density Category b: There are scattered areas of
fibroglandular density.
FINDINGS: There are no findings suspicious for malignancy. Images were
processed with CAD.
IMPRESSION: No mammographic evidence of malignancy. A result letter of this
screening mammogram will be mailed directly to the patient.

RECOMMENDATION:
Screening mammogram in one year. (Code:[F0])

BI-RADS CATEGORY  1: Negative.

## 2015-08-27 ENCOUNTER — Other Ambulatory Visit: Payer: Self-pay | Admitting: Family Medicine

## 2015-10-26 ENCOUNTER — Telehealth: Payer: Self-pay

## 2015-10-26 DIAGNOSIS — E785 Hyperlipidemia, unspecified: Secondary | ICD-10-CM

## 2015-10-26 DIAGNOSIS — I10 Essential (primary) hypertension: Secondary | ICD-10-CM | POA: Diagnosis not present

## 2015-10-26 DIAGNOSIS — E559 Vitamin D deficiency, unspecified: Secondary | ICD-10-CM | POA: Diagnosis not present

## 2015-10-26 DIAGNOSIS — R7303 Prediabetes: Secondary | ICD-10-CM

## 2015-10-26 DIAGNOSIS — R7309 Other abnormal glucose: Secondary | ICD-10-CM | POA: Diagnosis not present

## 2015-10-26 LAB — CBC
HEMATOCRIT: 38.4 % (ref 35.0–45.0)
Hemoglobin: 12.6 g/dL (ref 11.7–15.5)
MCH: 29.5 pg (ref 27.0–33.0)
MCHC: 32.8 g/dL (ref 32.0–36.0)
MCV: 89.9 fL (ref 80.0–100.0)
MPV: 9.4 fL (ref 7.5–12.5)
PLATELETS: 316 10*3/uL (ref 140–400)
RBC: 4.27 MIL/uL (ref 3.80–5.10)
RDW: 13.7 % (ref 11.0–15.0)
WBC: 3.7 10*3/uL — AB (ref 3.8–10.8)

## 2015-10-26 LAB — COMPREHENSIVE METABOLIC PANEL
ALBUMIN: 3.9 g/dL (ref 3.6–5.1)
ALT: 26 U/L (ref 6–29)
AST: 20 U/L (ref 10–35)
Alkaline Phosphatase: 77 U/L (ref 33–130)
BUN: 13 mg/dL (ref 7–25)
CALCIUM: 9.7 mg/dL (ref 8.6–10.4)
CHLORIDE: 104 mmol/L (ref 98–110)
CO2: 28 mmol/L (ref 20–31)
Creat: 0.81 mg/dL (ref 0.50–0.99)
Glucose, Bld: 90 mg/dL (ref 65–99)
POTASSIUM: 3.9 mmol/L (ref 3.5–5.3)
Sodium: 140 mmol/L (ref 135–146)
TOTAL PROTEIN: 6.8 g/dL (ref 6.1–8.1)
Total Bilirubin: 0.5 mg/dL (ref 0.2–1.2)

## 2015-10-26 LAB — HEMOGLOBIN A1C
Hgb A1c MFr Bld: 5.9 % — ABNORMAL HIGH (ref <5.7)
Mean Plasma Glucose: 123 mg/dL

## 2015-10-26 LAB — LIPID PANEL
CHOL/HDL RATIO: 3.2 ratio (ref <=5.0)
Cholesterol: 187 mg/dL (ref 125–200)
HDL: 59 mg/dL (ref >=46)
LDL CALC: 114 mg/dL (ref <130)
Triglycerides: 71 mg/dL (ref <150)
VLDL: 14 mg/dL (ref <30)

## 2015-10-26 NOTE — Telephone Encounter (Signed)
Lab order given in hand to patient

## 2015-10-27 LAB — VITAMIN D 25 HYDROXY (VIT D DEFICIENCY, FRACTURES): VIT D 25 HYDROXY: 35 ng/mL (ref 30–100)

## 2015-10-27 LAB — TSH: TSH: 1.95 m[IU]/L

## 2015-11-03 ENCOUNTER — Encounter: Payer: Self-pay | Admitting: Family Medicine

## 2015-11-03 ENCOUNTER — Ambulatory Visit (INDEPENDENT_AMBULATORY_CARE_PROVIDER_SITE_OTHER): Payer: Commercial Managed Care - HMO | Admitting: Family Medicine

## 2015-11-03 VITALS — BP 134/80 | HR 87 | Resp 16 | Ht 66.0 in | Wt 216.0 lb

## 2015-11-03 DIAGNOSIS — I10 Essential (primary) hypertension: Secondary | ICD-10-CM

## 2015-11-03 DIAGNOSIS — E8881 Metabolic syndrome: Secondary | ICD-10-CM

## 2015-11-03 DIAGNOSIS — R7303 Prediabetes: Secondary | ICD-10-CM | POA: Diagnosis not present

## 2015-11-03 DIAGNOSIS — IMO0001 Reserved for inherently not codable concepts without codable children: Secondary | ICD-10-CM

## 2015-11-03 DIAGNOSIS — G4700 Insomnia, unspecified: Secondary | ICD-10-CM | POA: Diagnosis not present

## 2015-11-03 DIAGNOSIS — M79645 Pain in left finger(s): Secondary | ICD-10-CM

## 2015-11-03 MED ORDER — TEMAZEPAM 15 MG PO CAPS: ORAL_CAPSULE | ORAL | Status: DC

## 2015-11-03 MED ORDER — PREDNISONE 5 MG (21) PO TBPK: 5.0000 mg | ORAL_TABLET | ORAL | Status: DC

## 2015-11-03 MED ORDER — IBUPROFEN 800 MG PO TABS: ORAL_TABLET | ORAL | Status: DC

## 2015-11-03 MED ORDER — RANITIDINE HCL 150 MG PO TABS: 150.0000 mg | ORAL_TABLET | Freq: Two times a day (BID) | ORAL | Status: DC

## 2015-11-03 NOTE — Progress Notes (Signed)
Subjective:    Patient ID: Karen [name], female    DOB: 1945-07-25, 70 y.o.   MRN: [id number]  HPI   Karen [name]     MRN: [id number]      DOB: 1945/11/10   HPI [name] is here for follow up and re-evaluation of chronic medical conditions, medication management and review of any available recent lab and radiology data.  Preventive health is updated, specifically  Cancer screening and Immunization.   Questions or concerns regarding consultations or procedures which the PT has had in the interim are  addressed. The PT denies any adverse reactions to current medications since the last visit.  Right shoulder, left leg and low back pain x 3 months , also  Left thumb pain and reduced mobility at first MP joint x 1 day, no trauma Right shoulder pain last week could not move joint  With popping  ROS Denies recent fever or chills. Denies sinus pressure, nasal congestion, ear pain or sore throat. Denies chest congestion, productive cough or wheezing. Denies chest pains, palpitations and leg swelling Denies abdominal pain, nausea, vomiting,diarrhea or constipation.   Denies dysuria, frequency, hesitancy or incontinence.  Denies headaches, seizures, numbness, or tingling. Denies depression, anxiety or insomnia. Denies skin break down or rash.   PE  BP 134/80 mmHg  Pulse 87  Resp 16  Ht 5\' 6"  (1.676 m)  Wt 216 lb (97.977 kg)  BMI 34.88 kg/m2  SpO2 97%  Patient alert and oriented and in no cardiopulmonary distress.  HEENT: No facial asymmetry, EOMI,   oropharynx pink and moist.  Neck supple no JVD, no mass.  Chest: Clear to auscultation bilaterally.  CVS: S1, S2 no murmurs, no S3.Regular rate.  ABD: Soft non tender.   Ext: No edema  MS: decreased  ROM lumbar spine,  Decreased in right shoulder, normal in  hips and knees.Tender, pain, redness and swelling of left thumb MP joint  Skin: Intact, no ulcerations or rash noted.  Psych: Good eye contact, normal affect.  Memory intact not anxious or depressed appearing.  CNS: CN 2-12 intact, power,  normal throughout.no focal deficits noted.   Assessment & Plan  Insomnia Sleep hygiene reviewed and written information offered also. Prescription sent for  medication needed.   Pain of left thumb Short anti inflammatory course prescribed, pt to call for ortho eval if needed   Essential hypertension, benign Controlled, no change in medication DASH diet and commitment to daily physical activity for a minimum of 30 minutes discussed and encouraged, as a part of hypertension management. The importance of attaining a healthy weight is also discussed.  BP/Weight 11/03/2015 04/07/2015 01/12/2015 12/08/2014 06/02/2014 01/28/2014 [id number]  Systolic BP Q000111Q [id number] [id number]  Diastolic BP 80 76 74 68 72 78 74  Wt. (Lbs) 216 204 - 203.12 207 197.8 196.12  BMI 34.88 32.94 - 32.3 32.91 31.45 31.18        Obesity, Class II, BMI 35-39.9, with comorbidity Deteriorated. Patient re-educated about  the importance of commitment to a  minimum of 150 minutes of exercise per week.  The importance of healthy food choices with portion control discussed. Encouraged to start a food diary, count calories and to consider  joining a support group. Sample diet sheets offered. Goals set by the patient for the next several months.   Weight /BMI 11/03/2015 04/07/2015 12/08/2014  WEIGHT 216 lb 204 lb 203 lb 1.9 oz  HEIGHT 5\' 6"  5\' 6"  5' 6.5"  BMI 34.88 kg/m2 32.94 kg/m2 32.3 kg/m2    Current exercise per week 90 minutes.   Prediabetes Patient educated about the importance of limiting  Carbohydrate intake , the need to commit to daily physical activity for a minimum of 30 minutes , and to commit weight loss. The fact that changes in all these areas will reduce or eliminate all together the development of diabetes is stressed.  Unchnaged  Diabetic Labs Latest Ref Rng 10/26/2015 04/01/2015 10/26/2014 03/28/2014 01/24/2014    HbA1c <5.7 % 5.9(H) 5.9(H) - 5.6 5.7(H)  Chol 125 - 200 mg/dL 187 181 162 174 178  HDL >=46 mg/dL 59 53 52 45 50  Calc LDL <130 mg/dL 114 115 98 117(H) 119(H)  Triglycerides <150 mg/dL 71 63 61 58 44  Creatinine 0.50 - 0.99 mg/dL 0.81 0.72 0.79 0.82 0.77   BP/Weight 11/03/2015 04/07/2015 01/12/2015 12/08/2014 06/02/2014 01/28/2014 [id number]  Systolic BP Q000111Q A999333 [id number]  Diastolic BP 80 76 74 68 72 78 74  Wt. (Lbs) 216 204 - 203.12 207 197.8 196.12  BMI 34.88 32.94 - 32.3 32.91 31.45 31.18   No flowsheet data found.     Metabolic syndrome X The increased risk of cardiovascular disease associated with this diagnosis, and the need to consistently work on lifestyle to change this is discussed. Following  a  heart healthy diet ,commitment to 30 minutes of exercise at least 5 days per week, as well as control of blood sugar and cholesterol , and achieving a healthy weight are all the areas to be addressed .        Review of Systems     Objective:   Physical Exam        Assessment & Plan:

## 2015-11-03 NOTE — Assessment & Plan Note (Addendum)
Short anti inflammatory course prescribed, pt to call for ortho eval if needed

## 2015-11-03 NOTE — Assessment & Plan Note (Signed)
Sleep hygiene reviewed and written information offered also. Prescription sent for  medication needed.  

## 2015-11-03 NOTE — Patient Instructions (Signed)
Annual wellness in 4.5  To 5 month, call if you need me before]  Medication sent for right shoulder, left thumb and back pain, call if not improved for ortho eval  HBA1C and chem 7 non fast in 5 months   Please work on good  health habits so that your health will improve. 1. Commitment to daily physical activity for 30 to 60  minutes, if you are able to do this.  2. Commitment to wise food choices. Aim for half of your  food intake to be vegetable and fruit, one quarter starchy foods, and one quarter protein. Try to eat on a regular schedule  3 meals per day, snacking between meals should be limited to vegetables or fruits or small portions of nuts. 64 ounces of water per day is generally recommended, unless you have specific health conditions, like heart failure or kidney failure where you will need to limit fluid intake.  3. Commitment to sufficient and a  good quality of physical and mental rest daily, generally between 6 to 8 hours per day.  WITH PERSISTANCE AND PERSEVERANCE, THE IMPOSSIBLE , BECOMES THE NORM!  Thank you  for choosing [facility]. We consider it a privelige to serve you.  Delivering excellent health care in a caring and  compassionate way is our goal.  Partnering with you,  so that together we can achieve this goal is our strategy.     Thanks for choosing [facility], we consider it a privelige to serve you.

## 2015-11-07 NOTE — Assessment & Plan Note (Signed)
The increased risk of cardiovascular disease associated with this diagnosis, and the need to consistently work on lifestyle to change this is discussed. Following  a  heart healthy diet ,commitment to 30 minutes of exercise at least 5 days per week, as well as control of blood sugar and cholesterol , and achieving a healthy weight are all the areas to be addressed .  

## 2015-11-07 NOTE — Assessment & Plan Note (Signed)
Patient educated about the importance of limiting  Carbohydrate intake , the need to commit to daily physical activity for a minimum of 30 minutes , and to commit weight loss. The fact that changes in all these areas will reduce or eliminate all together the development of diabetes is stressed.  Unchnaged  Diabetic Labs Latest Ref Rng 10/26/2015 04/01/2015 10/26/2014 03/28/2014 01/24/2014  HbA1c <5.7 % 5.9(H) 5.9(H) - 5.6 5.7(H)  Chol 125 - 200 mg/dL 187 181 162 174 178  HDL >=46 mg/dL 59 53 52 45 50  Calc LDL <130 mg/dL 114 115 98 117(H) 119(H)  Triglycerides <150 mg/dL 71 63 61 58 44  Creatinine 0.50 - 0.99 mg/dL 0.81 0.72 0.79 0.82 0.77   BP/Weight 11/03/2015 04/07/2015 01/12/2015 12/08/2014 06/02/2014 01/28/2014 [id number]  Systolic BP Q000111Q A999333 [id number]  Diastolic BP 80 76 74 68 72 78 74  Wt. (Lbs) 216 204 - 203.12 207 197.8 196.12  BMI 34.88 32.94 - 32.3 32.91 31.45 31.18   No flowsheet data found.

## 2015-11-07 NOTE — Assessment & Plan Note (Signed)
Controlled, no change in medication DASH diet and commitment to daily physical activity for a minimum of 30 minutes discussed and encouraged, as a part of hypertension management. The importance of attaining a healthy weight is also discussed.  BP/Weight 11/03/2015 04/07/2015 01/12/2015 12/08/2014 06/02/2014 01/28/2014 [id number]  Systolic BP Q000111Q A999333 [id number]  Diastolic BP 80 76 74 68 72 78 74  Wt. (Lbs) 216 204 - 203.12 207 197.8 196.12  BMI 34.88 32.94 - 32.3 32.91 31.45 31.18

## 2015-11-07 NOTE — Assessment & Plan Note (Signed)
Deteriorated. Patient re-educated about  the importance of commitment to a  minimum of 150 minutes of exercise per week.  The importance of healthy food choices with portion control discussed. Encouraged to start a food diary, count calories and to consider  joining a support group. Sample diet sheets offered. Goals set by the patient for the next several months.   Weight /BMI 11/03/2015 04/07/2015 12/08/2014  WEIGHT 216 lb 204 lb 203 lb 1.9 oz  HEIGHT 5\' 6"  5\' 6"  5' 6.5"  BMI 34.88 kg/m2 32.94 kg/m2 32.3 kg/m2    Current exercise per week 90 minutes.

## 2015-11-28 ENCOUNTER — Other Ambulatory Visit: Payer: Self-pay | Admitting: Family Medicine

## 2016-01-26 ENCOUNTER — Other Ambulatory Visit: Payer: Self-pay | Admitting: Family Medicine

## 2016-03-21 ENCOUNTER — Ambulatory Visit (INDEPENDENT_AMBULATORY_CARE_PROVIDER_SITE_OTHER): Payer: Commercial Managed Care - HMO

## 2016-03-21 DIAGNOSIS — Z23 Encounter for immunization: Secondary | ICD-10-CM

## 2016-04-24 ENCOUNTER — Encounter: Payer: Self-pay | Admitting: Internal Medicine

## 2016-04-27 ENCOUNTER — Telehealth: Payer: Self-pay

## 2016-04-27 ENCOUNTER — Other Ambulatory Visit: Payer: Self-pay | Admitting: Family Medicine

## 2016-04-27 DIAGNOSIS — R7303 Prediabetes: Secondary | ICD-10-CM

## 2016-04-27 DIAGNOSIS — E785 Hyperlipidemia, unspecified: Secondary | ICD-10-CM | POA: Diagnosis not present

## 2016-04-27 DIAGNOSIS — I10 Essential (primary) hypertension: Secondary | ICD-10-CM | POA: Diagnosis not present

## 2016-04-27 DIAGNOSIS — R7309 Other abnormal glucose: Secondary | ICD-10-CM | POA: Diagnosis not present

## 2016-04-27 LAB — BASIC METABOLIC PANEL
BUN: 15 mg/dL (ref 7–25)
CO2: 27 mmol/L (ref 20–31)
Calcium: 9.5 mg/dL (ref 8.6–10.4)
Chloride: 105 mmol/L (ref 98–110)
Creat: 0.73 mg/dL (ref 0.50–0.99)
GLUCOSE: 97 mg/dL (ref 65–99)
POTASSIUM: 3.9 mmol/L (ref 3.5–5.3)
SODIUM: 137 mmol/L (ref 135–146)

## 2016-04-27 NOTE — Telephone Encounter (Signed)
Labs ordered.

## 2016-04-28 LAB — HEMOGLOBIN A1C
Hgb A1c MFr Bld: 5.6 % (ref <5.7)
Mean Plasma Glucose: 114 mg/dL

## 2016-04-29 LAB — LIPID PANEL
CHOL/HDL RATIO: 3.2 ratio (ref <5.0)
Cholesterol: 165 mg/dL (ref <200)
HDL: 52 mg/dL (ref >50)
LDL CALC: 100 mg/dL — AB (ref <100)
Triglycerides: 65 mg/dL (ref <150)
VLDL: 13 mg/dL (ref <30)

## 2016-04-29 LAB — HEPATIC FUNCTION PANEL
ALBUMIN: 3.8 g/dL (ref 3.6–5.1)
ALT: 19 U/L (ref 6–29)
AST: 20 U/L (ref 10–35)
Alkaline Phosphatase: 61 U/L (ref 33–130)
BILIRUBIN TOTAL: 0.5 mg/dL (ref 0.2–1.2)
Bilirubin, Direct: 0.1 mg/dL (ref <=0.2)
Indirect Bilirubin: 0.4 mg/dL (ref 0.2–1.2)
Total Protein: 6.7 g/dL (ref 6.1–8.1)

## 2016-05-02 ENCOUNTER — Ambulatory Visit (INDEPENDENT_AMBULATORY_CARE_PROVIDER_SITE_OTHER): Payer: Commercial Managed Care - HMO

## 2016-05-02 ENCOUNTER — Telehealth: Payer: Self-pay

## 2016-05-02 VITALS — BP 130/60 | HR 80 | Resp 18 | Ht 66.0 in | Wt 217.0 lb

## 2016-05-02 DIAGNOSIS — R7303 Prediabetes: Secondary | ICD-10-CM

## 2016-05-02 DIAGNOSIS — E785 Hyperlipidemia, unspecified: Secondary | ICD-10-CM

## 2016-05-02 DIAGNOSIS — Z0000 Encounter for general adult medical examination without abnormal findings: Secondary | ICD-10-CM | POA: Diagnosis not present

## 2016-05-02 DIAGNOSIS — I10 Essential (primary) hypertension: Secondary | ICD-10-CM

## 2016-05-02 DIAGNOSIS — E559 Vitamin D deficiency, unspecified: Secondary | ICD-10-CM

## 2016-05-02 NOTE — Patient Instructions (Signed)
Thank you for choosing [facility] for your health care needs  The Annual Wellness Visit is designed to allow Karen [name] the chance to assist you in preserving and improving you health.   Dr. Moshe Cipro will see you back in May for your next office visit  If any labs are needed they will be mailed to you with the approximate date to have them done   If you have any questions or concerns feel free to contact the office

## 2016-05-02 NOTE — Telephone Encounter (Signed)
-----  Message from Fayrene Helper, MD sent at 04/30/2016  6:34 AM EST ----- pls explain excellent labs, normal blood sugar, cholesterol much improved, kidney and liver function normal Needs May 17 or after h f/u appt with me when you see her for AWV  Will need CBC, fasting lipid, cmp and EGFR, TSH and vit D for that visit, will need to call back for order

## 2016-05-02 NOTE — Telephone Encounter (Signed)
Repeat lab orders mailed.

## 2016-05-03 NOTE — Progress Notes (Signed)
Subjective:    Karen [name] is a 70 y.o. female who presents for Medicare Annual/Subsequent preventive examination.  Preventive Screening-Counseling & Management  Tobacco History  Smoking Status  . Never Smoker  Smokeless Tobacco  . Never Used       Current Problems (verified) Patient Active Problem List   Diagnosis Date Noted  . Insomnia 11/03/2015  . Pain of left thumb 11/03/2015  . Osteopenia 12/21/2014  . Seasonal allergies 06/07/2014  . Thrombocytosis ([place]) 08/01/2013  . Goiter, nontoxic, multinodular 07/31/2013  . Hx of adenomatous colonic polyps 05/01/2013  . Prediabetes 09/14/2012  . Metabolic syndrome X 0000000  . Pes anserinus tendinitis 11/22/2011  . Osteoarthritis of knee 11/22/2011  . Hyperlipidemia LDL goal <100 01/02/2008  . Obesity, Class II, BMI 35-39.9, with comorbidity 01/02/2008  . Essential hypertension, benign 01/02/2008    Medications Prior to Visit Current Outpatient Prescriptions on File Prior to Visit  Medication Sig Dispense Refill  . aspirin 81 MG tablet Take 81 mg by mouth daily.    . Cholecalciferol (D3-1000) 1000 UNITS tablet Take 2,000 Units by mouth daily.     . Coenzyme Q10 (CO Q 10 PO) Take 1 tablet by mouth daily.     . Ginger, Zingiber officinalis, (GINGER ROOT) 550 MG CAPS Take 1 capsule by mouth daily.     . Glucosamine-Chondroitin (OSTEO BI-FLEX REGULAR STRENGTH) 250-200 MG TABS Take 1 tablet by mouth daily.    Marland Kitchen ibuprofen (ADVIL,MOTRIN) 800 MG tablet One tablet twice daily for 1 week 14 tablet 0  . lisinopril-hydrochlorothiazide (PRINZIDE,ZESTORETIC) 20-25 MG tablet TAKE ONE TABLET BY MOUTH ONCE DAILY 90 tablet 1  . Probiotic Product (PROBIOTIC DAILY PO) Take by mouth daily.    . ranitidine (ZANTAC) 150 MG tablet Take 1 tablet (150 mg total) by mouth 2 (two) times daily. 30 tablet 0  . simvastatin (ZOCOR) 10 MG tablet TAKE ONE TABLET BY MOUTH AT BEDTIME, STOP ZETIA 90 tablet 1  . temazepam (RESTORIL) 15 MG capsule One  capsule at bedtime as needed, for sleep 30 capsule 0  . zinc gluconate 50 MG tablet Take 50 mg by mouth daily.     No current facility-administered medications on file prior to visit.     Current Medications (verified) Current Outpatient Prescriptions  Medication Sig Dispense Refill  . aspirin 81 MG tablet Take 81 mg by mouth daily.    . Cholecalciferol (D3-1000) 1000 UNITS tablet Take 2,000 Units by mouth daily.     . Coenzyme Q10 (CO Q 10 PO) Take 1 tablet by mouth daily.     . Ginger, Zingiber officinalis, (GINGER ROOT) 550 MG CAPS Take 1 capsule by mouth daily.     . Glucosamine-Chondroitin (OSTEO BI-FLEX REGULAR STRENGTH) 250-200 MG TABS Take 1 tablet by mouth daily.    Marland Kitchen ibuprofen (ADVIL,MOTRIN) 800 MG tablet One tablet twice daily for 1 week 14 tablet 0  . lisinopril-hydrochlorothiazide (PRINZIDE,ZESTORETIC) 20-25 MG tablet TAKE ONE TABLET BY MOUTH ONCE DAILY 90 tablet 1  . Probiotic Product (PROBIOTIC DAILY PO) Take by mouth daily.    . ranitidine (ZANTAC) 150 MG tablet Take 1 tablet (150 mg total) by mouth 2 (two) times daily. 30 tablet 0  . simvastatin (ZOCOR) 10 MG tablet TAKE ONE TABLET BY MOUTH AT BEDTIME, STOP ZETIA 90 tablet 1  . temazepam (RESTORIL) 15 MG capsule One capsule at bedtime as needed, for sleep 30 capsule 0  . zinc gluconate 50 MG tablet Take 50 mg by mouth daily.  No current facility-administered medications for this visit.      Allergies (verified) Penicillins   PAST HISTORY  Family History Family History  Problem Relation Age of Onset  . Hypertension Mother   . Cancer Mother     lung   . Hypertension Father   . Hypertension Sister   . Hypertension Brother   . Hypertension Sister     Social History Social History  Substance Use Topics  . Smoking status: Never Smoker  . Smokeless tobacco: Never Used  . Alcohol use No     Are there smokers in your home (other than you)? No  Risk Factors Current exercise habits: The patient does not  participate in regular exercise at present.  Dietary issues discussed: Changes with food labels, cholesterol lowering diet    Cardiac risk factors: advanced age (older than 26 for men, 38 for women), dyslipidemia and hypertension.  Depression Screen (Note: if answer to either of the following is "Yes", a more complete depression screening is indicated)   Over the past two weeks, have you felt down, depressed or hopeless? No  Over the past two weeks, have you felt little interest or pleasure in doing things? No  Have you lost interest or pleasure in daily life? No  Do you often feel hopeless? No  Do you cry easily over simple problems? No  Activities of Daily Living In your present state of health, do you have any difficulty performing the following activities?:  Driving? No Managing money?  No Feeding yourself? No Getting from bed to chair? No Climbing a flight of stairs? Yes Preparing food and eating?: No Bathing or showering? No Getting dressed: No Getting to the toilet? No Using the toilet:No Moving around from place to place: No In the past year have you fallen or had a near fall?:No   Are you sexually active?  Yes  Do you have more than one partner?  No  Hearing Difficulties: No Do you often ask people to speak up or repeat themselves? No Do you experience ringing or noises in your ears? No Do you have difficulty understanding soft or whispered voices? No   Do you feel that you have a problem with memory? No  Do you often misplace items? No  Do you feel safe at home?  Yes  Cognitive Testing  Alert? Yes  Normal Appearance?Yes  Oriented to person? Yes  Place? Yes   Time? Yes  Recall of three objects?  Yes  Can perform simple calculations? Yes  Displays appropriate judgment?Yes  Can read the correct time from a watch face?Yes   Advanced Directives have been discussed with the patient? Yes  List the Names of Other Physician/Practitioners you currently use: 1.  Dr.  Radford Pax- opthalmology  2.  Dr. Gala Romney -GI   Indicate any recent Medical Services you may have received from other than [facility] providers in the past year (date may be approximate).  Immunization History  Administered Date(s) Administered  . H1N1 04/17/2008  . Influenza Whole 03/29/2006, 07/30/2008, 03/26/2009  . Influenza,inj,Quad PF,36+ Mos 02/20/2013, 03/23/2014, 04/07/2015, 03/21/2016  . Pneumococcal Conjugate-13 07/16/2014  . Pneumococcal Polysaccharide-23 02/20/2013  . Td 04/27/2004  . Varicella 02/22/2013    Screening Tests Health Maintenance  Topic Date Due  . TETANUS/TDAP  07/05/2017 (Originally 04/27/2014)  . COLONOSCOPY  05/26/2016  . MAMMOGRAM  05/30/2017  . INFLUENZA VACCINE  Completed  . DEXA SCAN  Completed  . ZOSTAVAX  Completed  . Hepatitis C Screening  Completed  .  PNA vac Low Risk Adult  Completed    All answers were reviewed with the patient and necessary referrals were made:  Vanetta Mulders, LPN   [id number]   History reviewed: allergies, current medications, past family history, past medical history, past social history, past surgical history and problem list  Review of Systems A comprehensive review of systems was negative.    Objective:     Vision by Snellen chart: right eye:20/25, left eye:20/50  Body mass index is 35.02 kg/m. BP 130/60   Pulse 80   Resp 18   Ht 5\' 6"  (1.676 m)   Wt 217 lb (98.4 kg)   SpO2 97%   BMI 35.02 kg/m   No exam performed today, annual wellness without physical exam.     Assessment:        Plan:     During the course of the visit the patient was educated and counseled about appropriate screening and preventive services including:    Nutrition counseling   Diet review for nutrition referral? Yes ____  Not Indicated __x__   Patient Instructions (the written plan) was given to the patient.  Medicare Attestation I have personally reviewed: The patient's medical and social history Their use  of alcohol, tobacco or illicit drugs Their current medications and supplements The patient's functional ability including ADLs,fall risks, home safety risks, cognitive, and hearing and visual impairment Diet and physical activities Evidence for depression or mood disorders  The patient's weight, height, BMI, and visual acuity have been recorded in the chart.  I have made referrals, counseling, and provided education to the patient based on review of the above and I have provided the patient with a written personalized care plan for preventive services.     Denman George [place], [place]   [id number]

## 2016-05-23 ENCOUNTER — Encounter: Payer: Self-pay | Admitting: Family Medicine

## 2016-05-26 ENCOUNTER — Other Ambulatory Visit: Payer: Self-pay | Admitting: Pharmacist

## 2016-05-26 NOTE — Patient Outreach (Addendum)
Outreach call to [employer] regarding her request for follow up from the [facility] Medication Adherence Campaign. Called and spoke with patient. HIPAA identifiers verified and verbal consent received.  Ms. Fenning reports that she takes her simvastatin daily as directed. However, reports that last month she had stopped taking the simvastatin for a week to see if stopping the medication would relieve her muscle pain. Reports that she tried this once in the past as well as directed by her PCP. However, reports that neither time did she notice any change in her muscle pain. Advise patient to follow up with her PCP regarding this continuing muscle pain. Ms. Petzoldt states that she will.  Ms. Lamonda reports that she has an ongoing cough related to her lisinopril-hydrochlorothiazide therapy. Reports that she has discussed this in the past with her PCP, but that she has stayed on the medication despite the side effect. Reports that she is finding the cough to be irritating. Patient reports that she was on valsartan-hydrochlorothiazide in the past, but that the medication was brand name at the time and expensive. Review the [facility] online formulary and note that both lisinopril-hydrochlorothiazide and valsartan-hydrochlorothiazide are tier 1 generics for the patient. Offer to contact patient's PCP on her behalf to discuss this potential alternative for relief of her cough side effect. Patient states that she would appreciate my doing so.  Also let patient know about the cost savings of using mail order pharmacy for her prescriptions. Patient states that she will talk to her provider about making this change at their next visit.  PLAN:  Will send an InBasket message within this encounter to patient's PCP, Dr. Moshe Cipro [phone]), to recommend provider consider changing the patient's lisinopril-hydrochlorothiazide to valsartan-hydrochlorothiazide for relief of this cough side effect.  Harlow Asa,  PharmD, [facility] [phone]

## 2016-05-29 ENCOUNTER — Other Ambulatory Visit: Payer: Self-pay | Admitting: Family Medicine

## 2016-05-31 ENCOUNTER — Other Ambulatory Visit: Payer: Self-pay | Admitting: Family Medicine

## 2016-05-31 ENCOUNTER — Telehealth: Payer: Self-pay | Admitting: Family Medicine

## 2016-05-31 NOTE — Telephone Encounter (Signed)
Pls let pt know I response to message received, I am discontinuing the lisinopril/ hctz due to cough, and am restarting valsartan/hctz, which is entered historically and which you will need to send in after you spk with her. Has early Feb appt with me, bP will also be evaluated on new med at that visit ?? pls ask

## 2016-06-01 ENCOUNTER — Other Ambulatory Visit: Payer: Self-pay

## 2016-06-01 MED ORDER — VALSARTAN-HYDROCHLOROTHIAZIDE 80-12.5 MG PO TABS: 1.0000 | ORAL_TABLET | Freq: Every day | ORAL | 2 refills | Status: DC

## 2016-06-01 NOTE — Telephone Encounter (Signed)
Left message for pt to call office

## 2016-06-06 MED ORDER — VALSARTAN-HYDROCHLOROTHIAZIDE 80-12.5 MG PO TABS: 1.0000 | ORAL_TABLET | Freq: Every day | ORAL | 1 refills | Status: DC

## 2016-06-06 NOTE — Telephone Encounter (Signed)
Spoke to pt, order placed.

## 2016-06-06 NOTE — Addendum Note (Signed)
Addended by: Ova Freshwater on: 06/06/2016 01:21 PM   Modules accepted: Orders

## 2016-06-07 ENCOUNTER — Other Ambulatory Visit: Payer: Self-pay | Admitting: Pharmacist

## 2016-06-07 NOTE — Patient Outreach (Signed)
In response to my letter sent to patient's PCP on 05/26/16, receive a response from Dr. Moshe Cipro that my recommendation to change the patient's lisinopril-hydrochlorothiazide to valsartan-hydrochlorothiazide for relief of a cough side effect was "Noted and am making change, nurse will contact patient".   Will close pharmacy episode at this time.  Harlow Asa, PharmD, [place] [phone]

## 2016-07-05 ENCOUNTER — Other Ambulatory Visit: Payer: Self-pay | Admitting: Family Medicine

## 2016-07-05 DIAGNOSIS — Z1231 Encounter for screening mammogram for malignant neoplasm of breast: Secondary | ICD-10-CM

## 2016-07-10 ENCOUNTER — Ambulatory Visit (HOSPITAL_COMMUNITY)
Admission: RE | Admit: 2016-07-10 | Discharge: 2016-07-10 | Disposition: A | Discharge status: Home / Self Care | Payer: Commercial Managed Care - HMO | Source: Ambulatory Visit | Attending: Family Medicine | Admitting: Family Medicine

## 2016-07-10 DIAGNOSIS — Z1231 Encounter for screening mammogram for malignant neoplasm of breast: Secondary | ICD-10-CM | POA: Diagnosis not present

## 2016-07-10 IMAGING — MG 2D DIGITAL SCREENING BILATERAL MAMMOGRAM WITH CAD AND ADJUNCT TO
2 series · 3 of 6 positions shown · non-contrast
Comparison: Previous exam(s).

CLINICAL DATA: Screening.

EXAM:
2D DIGITAL SCREENING BILATERAL MAMMOGRAM WITH CAD AND ADJUNCT TOMO

[R MLO]
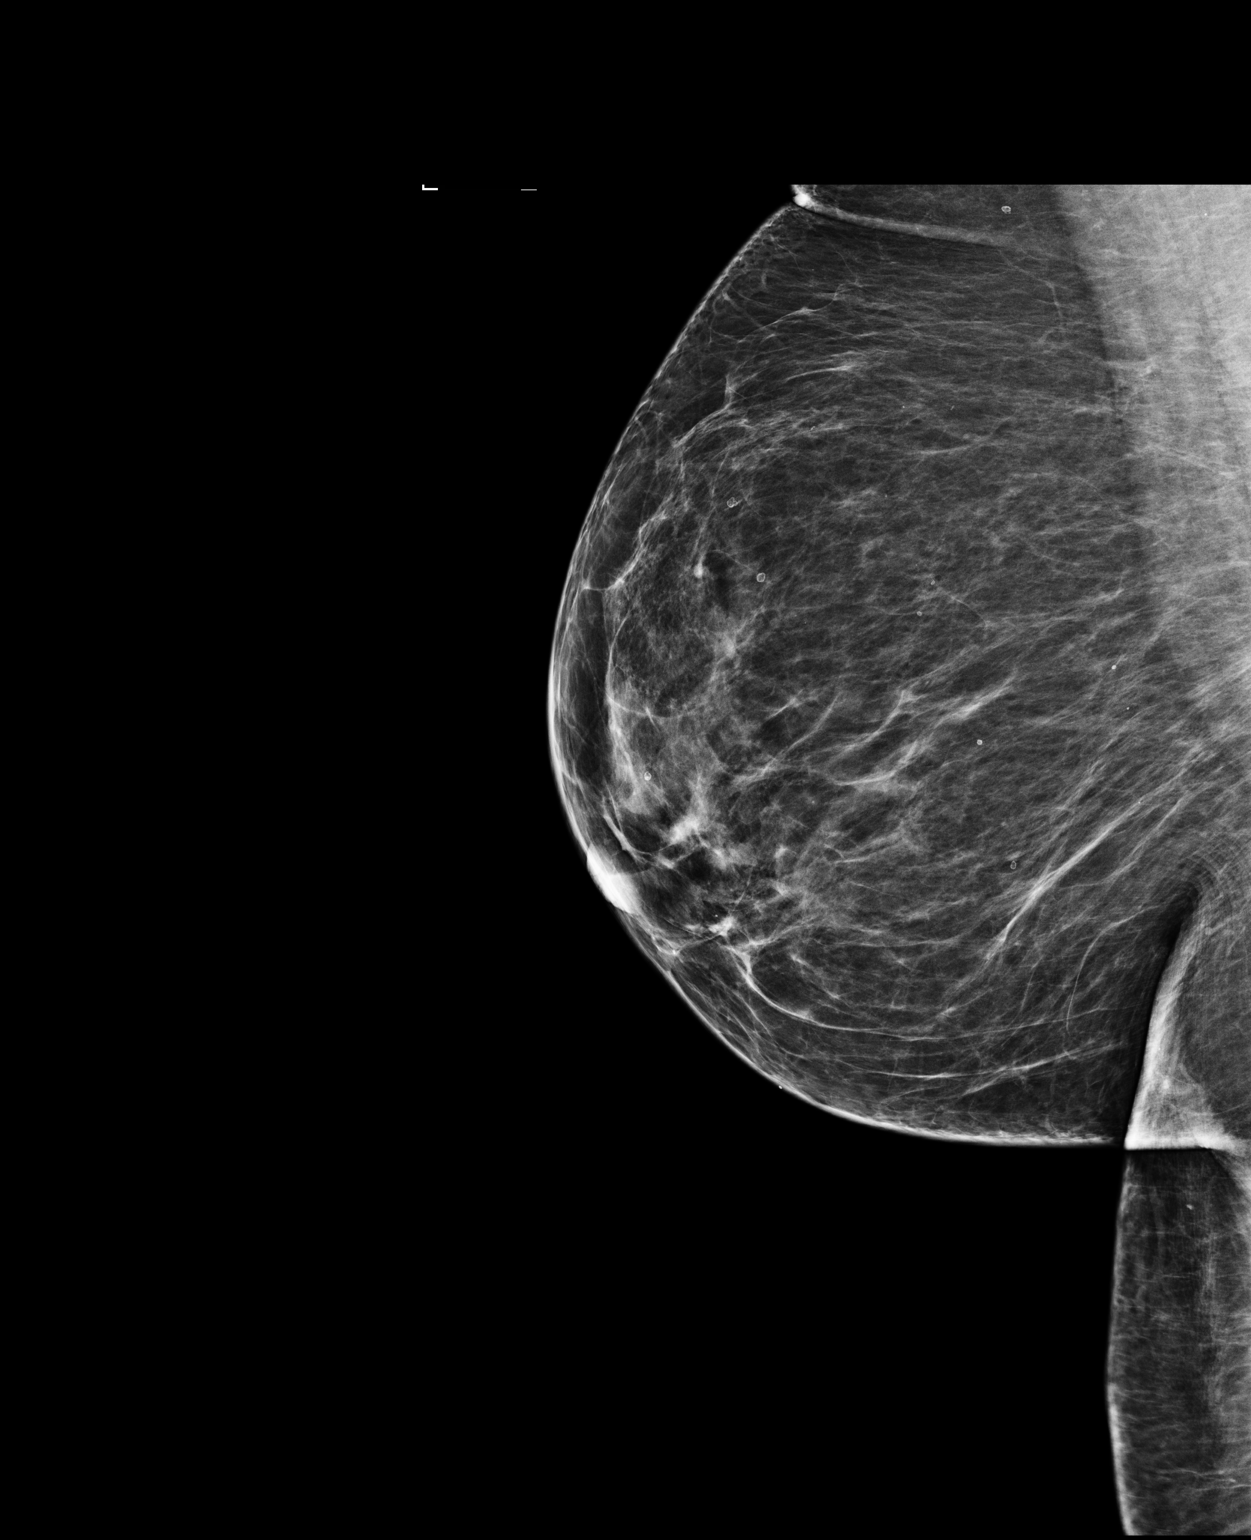

[L CC tomo · 2 of 71 frames shown]
[frame 23/71]
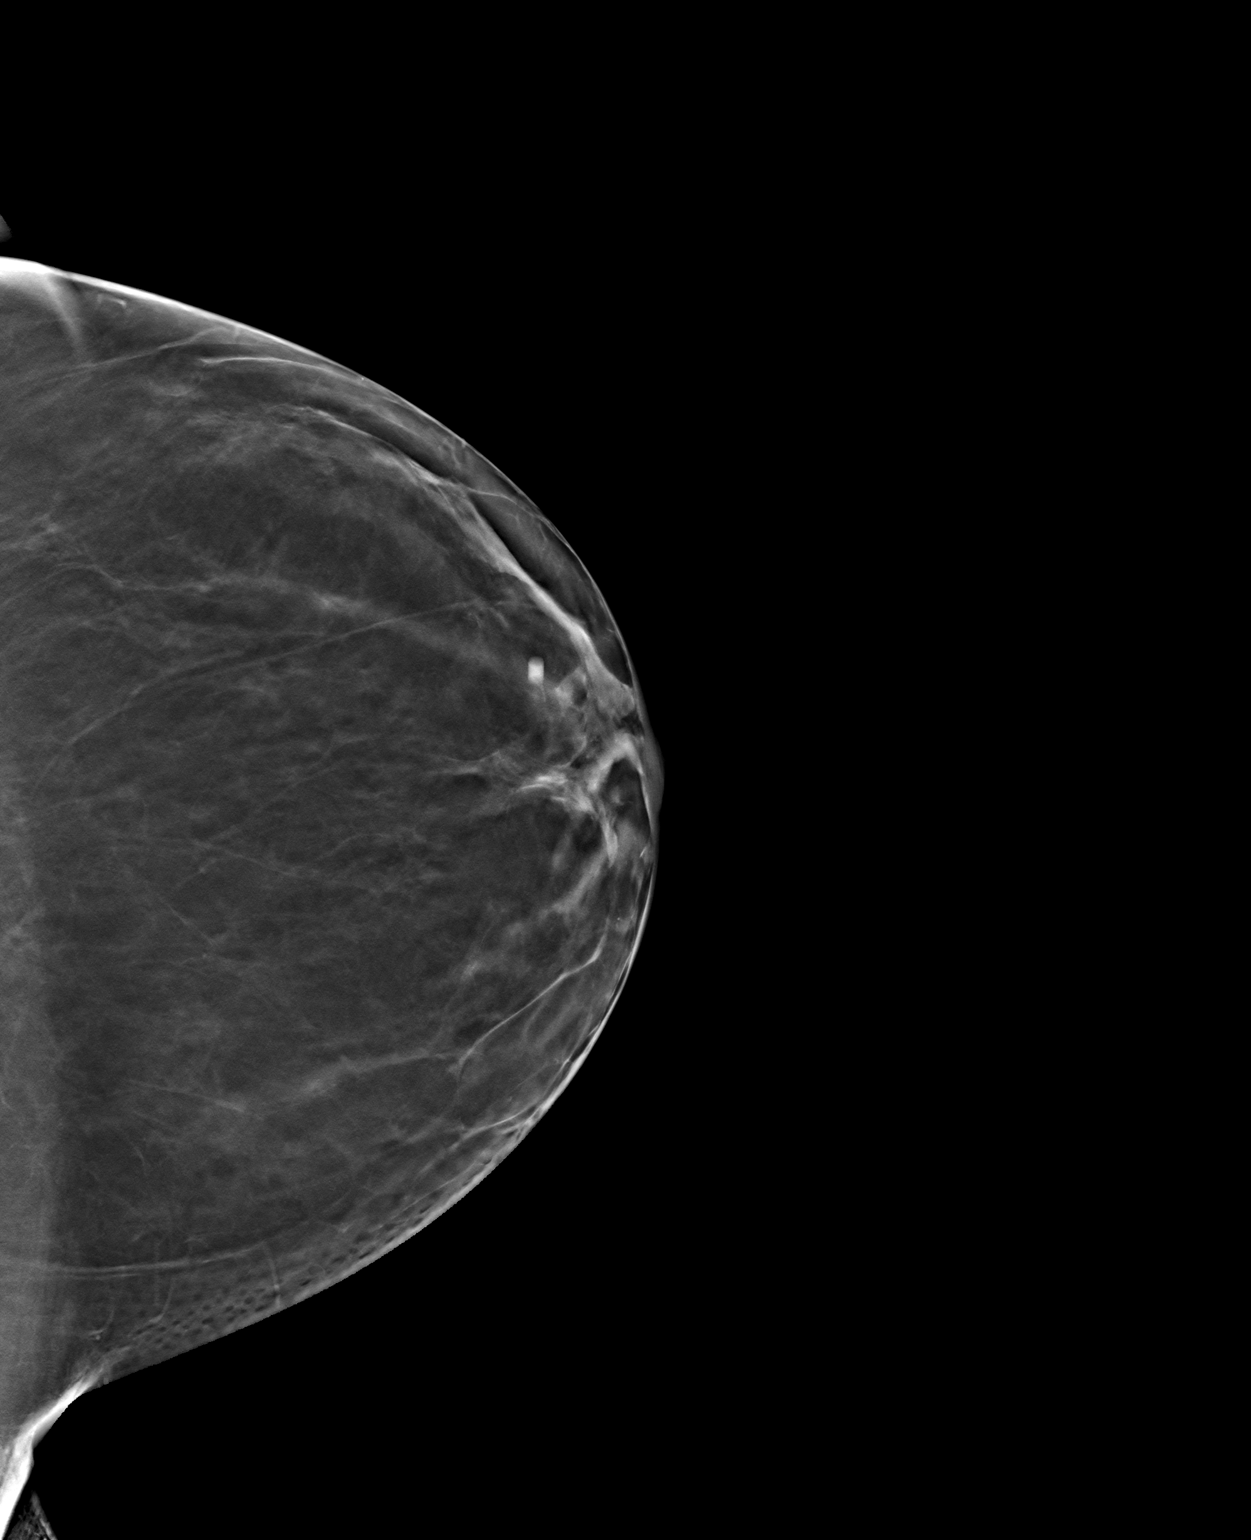
[frame 36/71]
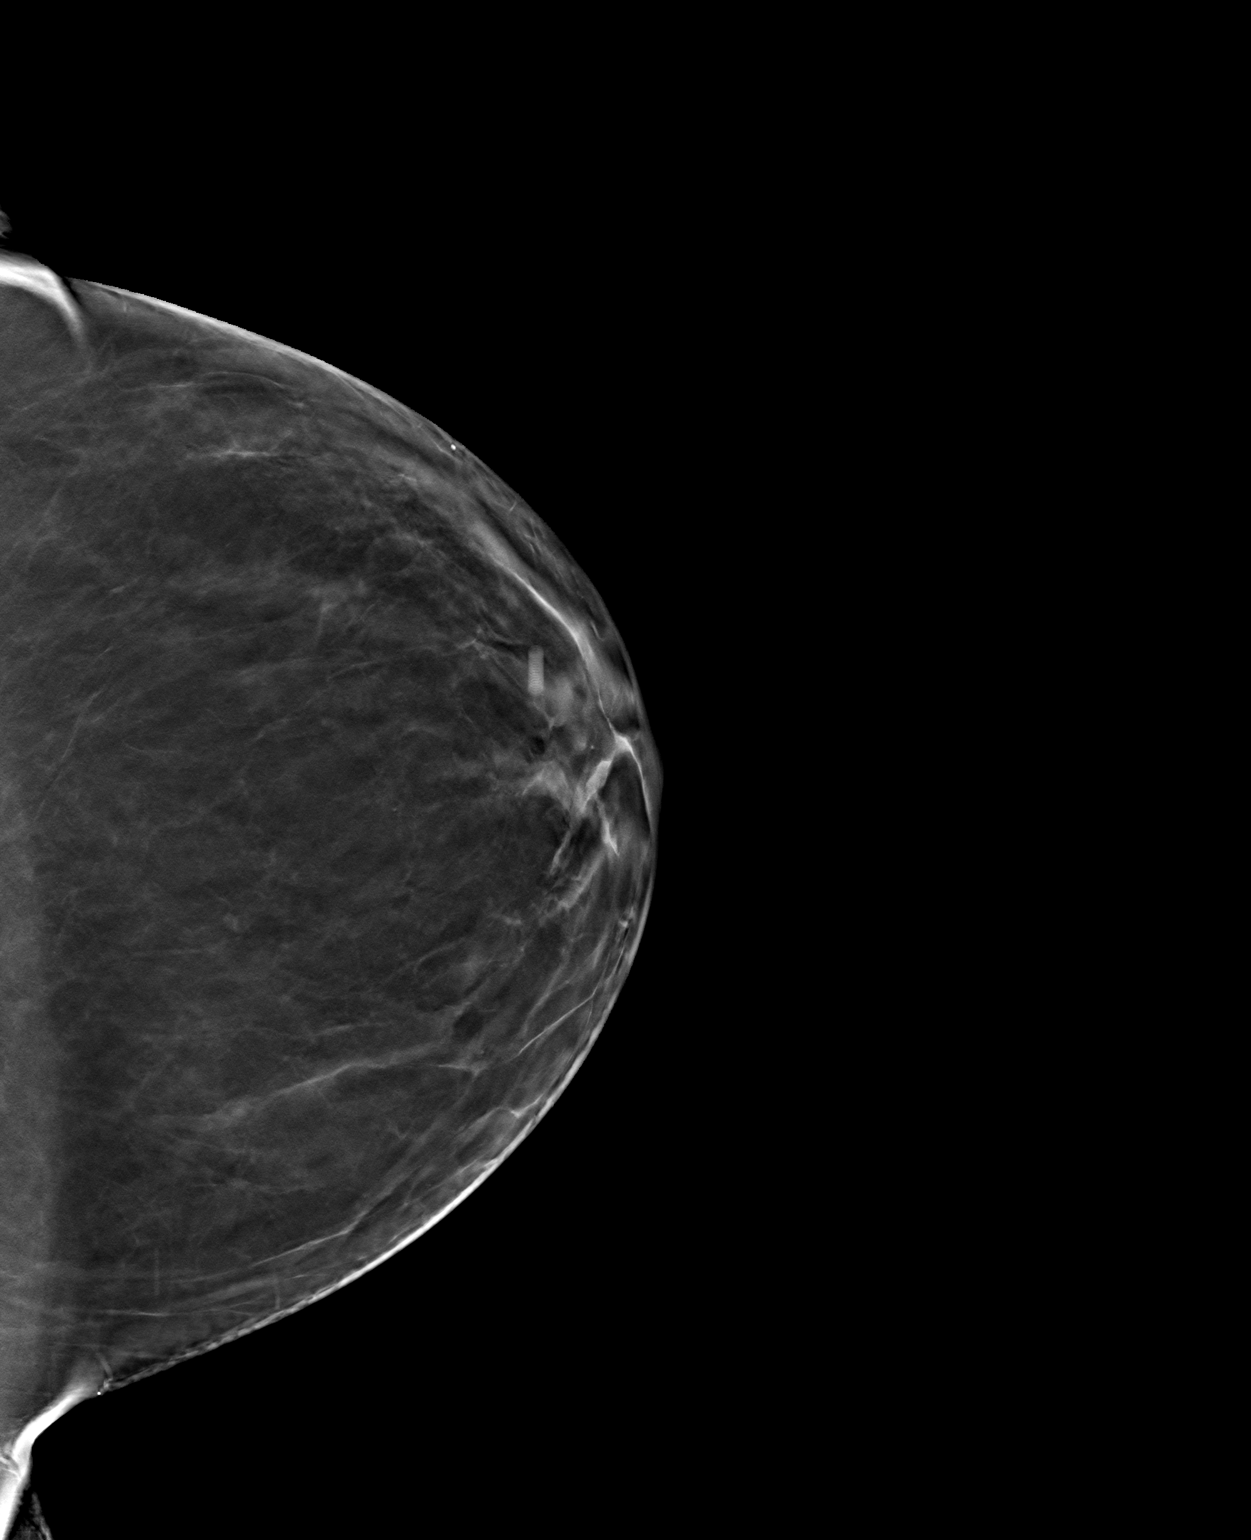

[3 of 6 positions shown; findings below may reference images not displayed]

ACR Breast Density Category b: There are scattered areas of
fibroglandular density.
FINDINGS: There are no findings suspicious for malignancy. Images were
processed with CAD.
IMPRESSION: No mammographic evidence of malignancy. A result letter of this
screening mammogram will be mailed directly to the patient.

RECOMMENDATION:
Screening mammogram in one year. (Code:[33])

BI-RADS CATEGORY  1: Negative.

## 2016-07-13 DIAGNOSIS — I10 Essential (primary) hypertension: Secondary | ICD-10-CM | POA: Diagnosis not present

## 2016-07-13 DIAGNOSIS — E785 Hyperlipidemia, unspecified: Secondary | ICD-10-CM | POA: Diagnosis not present

## 2016-07-13 DIAGNOSIS — E559 Vitamin D deficiency, unspecified: Secondary | ICD-10-CM | POA: Diagnosis not present

## 2016-07-13 DIAGNOSIS — R7303 Prediabetes: Secondary | ICD-10-CM | POA: Diagnosis not present

## 2016-07-13 LAB — COMPLETE METABOLIC PANEL WITH GFR
ALT: 25 U/L (ref 6–29)
AST: 21 U/L (ref 10–35)
Albumin: 3.7 g/dL (ref 3.6–5.1)
Alkaline Phosphatase: 75 U/L (ref 33–130)
BUN: 12 mg/dL (ref 7–25)
CALCIUM: 9.4 mg/dL (ref 8.6–10.4)
CHLORIDE: 104 mmol/L (ref 98–110)
CO2: 30 mmol/L (ref 20–31)
CREATININE: 0.78 mg/dL (ref 0.50–0.99)
GFR, Est African American: >89 mL/min (ref >=60)
GFR, Est Non African American: 78 mL/min (ref >=60)
GLUCOSE: 91 mg/dL (ref 65–99)
Potassium: 3.9 mmol/L (ref 3.5–5.3)
SODIUM: 141 mmol/L (ref 135–146)
Total Bilirubin: 0.7 mg/dL (ref 0.2–1.2)
Total Protein: 6.8 g/dL (ref 6.1–8.1)

## 2016-07-13 LAB — CBC
HCT: 37.6 % (ref 35.0–45.0)
Hemoglobin: 12.4 g/dL (ref 11.7–15.5)
MCH: 29.9 pg (ref 27.0–33.0)
MCHC: 33 g/dL (ref 32.0–36.0)
MCV: 90.6 fL (ref 80.0–100.0)
MPV: 9.4 fL (ref 7.5–12.5)
PLATELETS: 327 10*3/uL (ref 140–400)
RBC: 4.15 MIL/uL (ref 3.80–5.10)
RDW: 13.2 % (ref 11.0–15.0)
WBC: 4 10*3/uL (ref 3.8–10.8)

## 2016-07-13 LAB — LIPID PANEL
CHOL/HDL RATIO: 3 ratio (ref <5.0)
CHOLESTEROL: 155 mg/dL (ref <200)
HDL: 52 mg/dL (ref >50)
LDL Cholesterol: 89 mg/dL (ref <100)
TRIGLYCERIDES: 69 mg/dL (ref <150)
VLDL: 14 mg/dL (ref <30)

## 2016-07-13 LAB — TSH: TSH: 1.71 mIU/L

## 2016-07-14 LAB — VITAMIN D 25 HYDROXY (VIT D DEFICIENCY, FRACTURES): Vit D, 25-Hydroxy: 40 ng/mL (ref 30–100)

## 2016-07-18 ENCOUNTER — Ambulatory Visit (INDEPENDENT_AMBULATORY_CARE_PROVIDER_SITE_OTHER): Payer: Commercial Managed Care - HMO | Admitting: Family Medicine

## 2016-07-18 ENCOUNTER — Encounter: Payer: Self-pay | Admitting: Family Medicine

## 2016-07-18 VITALS — BP 138/78 | HR 83 | Resp 16 | Ht 66.0 in | Wt 221.0 lb

## 2016-07-18 DIAGNOSIS — Z0000 Encounter for general adult medical examination without abnormal findings: Secondary | ICD-10-CM

## 2016-07-18 DIAGNOSIS — Z1211 Encounter for screening for malignant neoplasm of colon: Secondary | ICD-10-CM | POA: Diagnosis not present

## 2016-07-18 LAB — POC HEMOCCULT BLD/STL (OFFICE/1-CARD/DIAGNOSTIC): FECAL OCCULT BLD: NEGATIVE

## 2016-07-18 NOTE — Patient Instructions (Addendum)
F/u end August / early September, call if you need me before  Please make and keep gI appontment  Lifestyle changes as we discussed  Excellent labs, no med changes  Call for referral for eval of right shoulder by ortho when you decide on this  Call for lab sheet in June , you will need fasting lipid and cmp  All the best for 2018  Thank you  for choosing [facility]. We consider it a privelige to serve you.  Delivering excellent health care in a caring and  compassionate way is our goal.  Partnering with you,  so that together we can achieve this goal is our strategy.

## 2016-07-18 NOTE — Progress Notes (Signed)
    Karen [name]     MRN: [id number]      DOB: 1945-12-31  HPI: Patient is in for annual physical exam. C/o right shoulder and neck pain which sometimes keeps her awake , but no interest in further testing or orho eval. Recent labs, are reviewed and are excellent Immunization is reviewed ,  Recently had a mammogram , so does not want/ need breast exam, also has had hysterectomy, has no pelvic complaints and is deferring    PE: Pleasant  female, alert and oriented x 3, in no cardio-pulmonary distress. Afebrile. HEENT No facial trauma or asymetry. Sinuses non tender.  Extra occullar muscles intact External ears normal, tympanic membranes clear. Oropharynx moist, no exudate. Neck: supple, no adenopathy,JVD or thyromegaly.No bruits.  Chest: Clear to ascultation bilaterally.No crackles or wheezes. Non tender to palpation  Breast: Not examined  Cardiovascular system; Heart sounds normal,  S1 and  S2 ,no S3.  No murmur, or thrill. Apical beat not displaced Peripheral pulses normal.  Abdomen: Soft, non tender, no organomegaly or masses. No bruits. Bowel sounds normal. No guarding, tenderness or rebound.  Rectal:  Normal sphincter tone. No rectal mass. Guaiac negative stool.  GU: Not examined.  .     Musculoskeletal exam: Full ROM of spine, hips ,  and knees. Reduced ROM right shoulder No deformity ,swelling or crepitus noted. No muscle wasting or atrophy.   Neurologic: Cranial nerves 2 to 12 intact. Power, tone ,sensation and reflexes normal throughout. No disturbance in gait. No tremor.  Skin: Intact, no ulceration, erythema , scaling or rash noted. Pigmentation normal throughout  Psych; Normal mood and affect. Judgement and concentration normal   Assessment & Plan:  Annual physical exam Annual exam as documented. Counseling done  re healthy lifestyle involving commitment to 150 minutes exercise per week, heart healthy diet, and attaining healthy  weight.The importance of adequate sleep also discussed. .Changes in health habits are decided on by the patient with goals and time frames  set for achieving them. Immunization and cancer screening needs are specifically addressed at this visit.

## 2016-07-18 NOTE — Assessment & Plan Note (Signed)
Annual exam as documented. Counseling done  re healthy lifestyle involving commitment to 150 minutes exercise per week, heart healthy diet, and attaining healthy weight.The importance of adequate sleep also discussed. Changes in health habits are decided on by the patient with goals and time frames  set for achieving them. Immunization and cancer screening needs are specifically addressed at this visit. 

## 2016-08-04 ENCOUNTER — Other Ambulatory Visit: Payer: Self-pay | Admitting: Family Medicine

## 2016-10-31 ENCOUNTER — Ambulatory Visit: Payer: Commercial Managed Care - HMO | Admitting: Family Medicine

## 2017-01-15 ENCOUNTER — Telehealth: Payer: Self-pay | Admitting: Family Medicine

## 2017-01-15 ENCOUNTER — Other Ambulatory Visit: Payer: Self-pay | Admitting: Family Medicine

## 2017-01-15 MED ORDER — LOSARTAN POTASSIUM-HCTZ 50-12.5 MG PO TABS: 1.0000 | ORAL_TABLET | Freq: Every day | ORAL | 3 refills | Status: DC

## 2017-01-15 NOTE — Telephone Encounter (Signed)
Discontinued valsartan and hyzaar sent, pt aware, pls fax

## 2017-01-15 NOTE — Telephone Encounter (Signed)
Patient calling wanting to know if her pharmacy faxed info to you regarding the recall on valsartan-hydrochlorothiazide (DIOVAN HCT) 80-12.5 MG tablet, as she was advised by the pharmacist to stop taking the Rx and to call our office for something comparable.      Karen [name]

## 2017-01-15 NOTE — Telephone Encounter (Signed)
Junie Panning from [facility] called and left message on nurse line regarding patient. She states that she has a refill request for patient for valsartan-hctz and wants to know if the medication will be changed since valsartan is under recall.   Callback for [employer] [phone]

## 2017-02-09 ENCOUNTER — Telehealth: Payer: Self-pay

## 2017-02-09 DIAGNOSIS — I10 Essential (primary) hypertension: Secondary | ICD-10-CM

## 2017-02-09 LAB — CBC
HCT: 37.9 % (ref 35.0–45.0)
Hemoglobin: 12.4 g/dL (ref 11.7–15.5)
MCH: 29.7 pg (ref 27.0–33.0)
MCHC: 32.7 g/dL (ref 32.0–36.0)
MCV: 90.9 fL (ref 80.0–100.0)
MPV: 9.4 fL (ref 7.5–12.5)
PLATELETS: 339 10*3/uL (ref 140–400)
RBC: 4.17 MIL/uL (ref 3.80–5.10)
RDW: 13.5 % (ref 11.0–15.0)
WBC: 4.7 10*3/uL (ref 3.8–10.8)

## 2017-02-09 NOTE — Telephone Encounter (Signed)
Lab orders signed.

## 2017-02-10 LAB — LIPID PANEL
CHOL/HDL RATIO: 2.8 ratio (ref <5.0)
Cholesterol: 154 mg/dL (ref <200)
HDL: 55 mg/dL (ref >50)
LDL Cholesterol: 85 mg/dL (ref <100)
TRIGLYCERIDES: 69 mg/dL (ref <150)
VLDL: 14 mg/dL (ref <30)

## 2017-02-10 LAB — URINALYSIS, MICROSCOPIC ONLY
Casts: NONE SEEN [LPF]
Crystals: NONE SEEN [HPF]
YEAST: NONE SEEN [HPF]

## 2017-02-10 LAB — URINALYSIS, ROUTINE W REFLEX MICROSCOPIC
BILIRUBIN URINE: NEGATIVE
GLUCOSE, UA: NEGATIVE
Hgb urine dipstick: NEGATIVE
KETONES UR: NEGATIVE
Nitrite: NEGATIVE
Protein, ur: NEGATIVE
SPECIFIC GRAVITY, URINE: 1.021 (ref 1.001–1.035)
pH: 6 (ref 5.0–8.0)

## 2017-02-10 LAB — COMPLETE METABOLIC PANEL WITH GFR
ALK PHOS: 92 U/L (ref 33–130)
ALT: 21 U/L (ref 6–29)
AST: 19 U/L (ref 10–35)
Albumin: 3.8 g/dL (ref 3.6–5.1)
BUN: 12 mg/dL (ref 7–25)
CO2: 28 mmol/L (ref 20–32)
Calcium: 9.5 mg/dL (ref 8.6–10.4)
Chloride: 106 mmol/L (ref 98–110)
Creat: 0.74 mg/dL (ref 0.50–0.99)
GFR, EST NON AFRICAN AMERICAN: 83 mL/min (ref >=60)
GLUCOSE: 85 mg/dL (ref 65–99)
POTASSIUM: 4.1 mmol/L (ref 3.5–5.3)
SODIUM: 141 mmol/L (ref 135–146)
Total Bilirubin: 0.7 mg/dL (ref 0.2–1.2)
Total Protein: 6.6 g/dL (ref 6.1–8.1)

## 2017-02-13 ENCOUNTER — Ambulatory Visit (INDEPENDENT_AMBULATORY_CARE_PROVIDER_SITE_OTHER): Payer: Commercial Managed Care - HMO | Admitting: Family Medicine

## 2017-02-13 ENCOUNTER — Encounter: Payer: Self-pay | Admitting: Family Medicine

## 2017-02-13 ENCOUNTER — Other Ambulatory Visit: Payer: Self-pay | Admitting: Family Medicine

## 2017-02-13 VITALS — BP 150/70 | HR 75 | Temp 99.0°F | Ht 66.0 in | Wt 220.0 lb

## 2017-02-13 DIAGNOSIS — M722 Plantar fascial fibromatosis: Secondary | ICD-10-CM | POA: Diagnosis not present

## 2017-02-13 DIAGNOSIS — I10 Essential (primary) hypertension: Secondary | ICD-10-CM | POA: Diagnosis not present

## 2017-02-13 DIAGNOSIS — Z23 Encounter for immunization: Secondary | ICD-10-CM

## 2017-02-13 DIAGNOSIS — R197 Diarrhea, unspecified: Secondary | ICD-10-CM

## 2017-02-13 DIAGNOSIS — E669 Obesity, unspecified: Secondary | ICD-10-CM

## 2017-02-13 DIAGNOSIS — IMO0001 Reserved for inherently not codable concepts without codable children: Secondary | ICD-10-CM

## 2017-02-13 DIAGNOSIS — E785 Hyperlipidemia, unspecified: Secondary | ICD-10-CM | POA: Diagnosis not present

## 2017-02-13 MED ORDER — LOSARTAN POTASSIUM-HCTZ 100-12.5 MG PO TABS: 1.0000 | ORAL_TABLET | Freq: Every day | ORAL | 3 refills | Status: DC

## 2017-02-13 NOTE — Progress Notes (Signed)
Karen [name]     MRN: [id number]      DOB: 1945/11/17   HPI Karen [name] is here for follow up and re-evaluation of chronic medical conditions, medication management and review of any available recent lab and radiology data.  Preventive health is updated, specifically  Cancer screening and Immunization.   Questions or concerns regarding consultations or procedures which the PT has had in the interim are  addressed. The PT denies any adverse reactions to current medications since the last visit.  1 week h/o frequency in stool with C diff exposure Bilateral foot/heel pain x 2 months after excess exercise, will try home management before seeing podiatry, educated re plantar fascitis and management  Back and bilateral lower ext pain for months,intermitttently, thought it may be due to statin, but I have educated her that it is related to nerve pain from arhtritis    ROS Denies recent fever or chills. Denies sinus pressure, nasal congestion, ear pain or sore throat. Denies chest congestion, productive cough or wheezing. Denies chest pains, palpitations and leg swelling Denies abdominal pain, nausea, vomiting, or constipation.   Denies dysuria, frequency, hesitancy or incontinence. . Denies headaches, seizures, numbness, or tingling. Denies depression, anxiety or insomnia. Denies skin break down or rash.   PE  BP (!) 150/70 (BP Location: Left Arm, Patient Position: Sitting, Cuff Size: Large)   Pulse 75   Temp 99 F (37.2 C)   Ht 5\' 6"  (1.676 m)   Wt 220 lb (99.8 kg)   SpO2 98%   BMI 35.51 kg/m   Patient alert and oriented and in no cardiopulmonary distress.  HEENT: No facial asymmetry, EOMI,   oropharynx pink and moist.  Neck supple no JVD, no mass.  Chest: Clear to auscultation bilaterally.  CVS: S1, S2 no murmurs, no S3.Regular rate.  ABD: Soft non tender.   Ext: No edema  MS: Adequate ROM spine, shoulders, hips and knees.  Skin: Intact, no ulcerations or rash  noted.  Psych: Good eye contact, normal affect. Memory intact not anxious or depressed appearing.  CNS: CN 2-12 intact, power,  normal throughout.no focal deficits noted.   Assessment & Plan  Essential hypertension, benign Elevated SBP, no med change DASH diet and commitment to daily physical activity for a minimum of 30 minutes discussed and encouraged, as a part of hypertension management. The importance of attaining a healthy weight is also discussed.  BP/Weight 02/13/2017 07/18/2016 05/02/2016 11/03/2015 04/07/2015 01/12/2015 10/18/3265  Systolic BP 124 580 998 338 250 539 767  Diastolic BP 70 78 60 80 76 74 68  Wt. (Lbs) 220 221 217 216 204 - 203.12  BMI 35.51 35.67 35.02 34.88 32.94 - 32.3      re evaluate in 6 weeks, excess weight gain  Hyperlipidemia LDL goal <100 Hyperlipidemia:Low fat diet discussed and encouraged.   Lipid Panel  Lab Results  Component Value Date   CHOL 154 02/09/2017   HDL 55 02/09/2017   LDLCALC 85 02/09/2017   TRIG 69 02/09/2017   CHOLHDL 2.8 02/09/2017   Controlled, no change in medication     Obesity, Class II, BMI 35-39.9, with comorbidity Unchnaged. Patient re-educated about  the importance of commitment to a  minimum of 150 minutes of exercise per week.  The importance of healthy food choices with portion control discussed. Encouraged to start a food diary, count calories and to consider  joining a support group. Sample diet sheets offered. Goals set by the patient for the next  several months.   Weight /BMI 02/13/2017 07/18/2016 05/02/2016  WEIGHT 220 lb 221 lb 217 lb  HEIGHT 5\' 6"  5\' 6"  5\' 6"   BMI 35.51 kg/m2 35.67 kg/m2 35.02 kg/m2      Diarrhea, unspecified Recent C dif exposure potentially with past h/o severe C dif infection, stool to be sent for testing  Plantar fasciitis Pt education done, will call for referral to podiatry if needed, pain started 2 months ago after excess foot trauma

## 2017-02-13 NOTE — Patient Instructions (Addendum)
Wellness visit with nurse in 6 weeks, call if you need me before  Physical with MD early March  Excellent labs  Flu vaccine  today  It is important that you exercise regularly at least 30 minutes 5 times a week. If you develop chest pain, have severe difficulty breathing, or feel very tired, stop exercising immediately and seek medical attention   Please work on good  health habits so that your health will improve. 1. Commitment to daily physical activity for 30 to 60  minutes, if you are able to do this.  2. Commitment to wise food choices. Aim for half of your  food intake to be vegetable and fruit, one quarter starchy foods, and one quarter protein. Try to eat on a regular schedule  3 meals per day, snacking between meals should be limited to vegetables or fruits or small portions of nuts. 64 ounces of water per day is generally recommended, unless you have specific health conditions, like heart failure or kidney failure where you will need to limit fluid intake.  3. Commitment to sufficient and a  good quality of physical and mental rest daily, generally between 6 to 8 hours per day.  WITH PERSISTANCE AND PERSEVERANCE, THE IMPOSSIBLE , BECOMES THE NORM!   Md to recheck BP at this wellness visit also if abnormal  Increase in hyzaqr to 100/12.5 one daily start  Tomorrow, fill script today please  Excel;lent l;abs  Stool to be sent to lab for C diff testing based on h/o diarrheah stool and exposure   Plantar Fasciitis Plantar fasciitis is a painful foot condition that affects the heel. It occurs when the band of tissue that connects the toes to the heel bone (plantar fascia) becomes irritated. This can happen after exercising too much or doing other repetitive activities (overuse injury). The pain from plantar fasciitis can range from mild irritation to severe pain that makes it difficult for you to walk or move. The pain is usually worse in the morning or after you have been sitting  or lying down for a while. What are the causes? This condition may be caused by:  Standing for long periods of time.  Wearing shoes that do not fit.  Doing high-impact activities, including running, aerobics, and ballet.  Being overweight.  Having an abnormal way of walking (gait).  Having tight calf muscles.  Having high arches in your feet.  Starting a new athletic activity.  What are the signs or symptoms? The main symptom of this condition is heel pain. Other symptoms include:  Pain that gets worse after activity or exercise.  Pain that is worse in the morning or after resting.  Pain that goes away after you walk for a few minutes.  How is this diagnosed? This condition may be diagnosed based on your signs and symptoms. Your health care provider will also do a physical exam to check for:  A tender area on the bottom of your foot.  A high arch in your foot.  Pain when you move your foot.  Difficulty moving your foot.  You may also need to have imaging studies to confirm the diagnosis. These can include:  X-rays.  Ultrasound.  MRI.  How is this treated? Treatment for plantar fasciitis depends on the severity of the condition. Your treatment may include:  Rest, ice, and over-the-counter pain medicines to manage your pain.  Exercises to stretch your calves and your plantar fascia.  A splint that holds your foot in a  stretched, upward position while you sleep (night splint).  Physical therapy to relieve symptoms and prevent problems in the future.  Cortisone injections to relieve severe pain.  Extracorporeal shock wave therapy (ESWT) to stimulate damaged plantar fascia with electrical impulses. It is often used as a last resort before surgery.  Surgery, if other treatments have not worked after 12 months.  Follow these instructions at home:  Take medicines only as directed by your health care provider.  Avoid activities that cause pain.  Roll the  bottom of your foot over a bag of ice or a bottle of cold water. Do this for 20 minutes, 3-4 times a day.  Perform simple stretches as directed by your health care provider.  Try wearing athletic shoes with air-sole or gel-sole cushions or soft shoe inserts.  Wear a night splint while sleeping, if directed by your health care provider.  Keep all follow-up appointments with your health care provider. How is this prevented?  Do not perform exercises or activities that cause heel pain.  Consider finding low-impact activities if you continue to have problems.  Lose weight if you need to. The best way to prevent plantar fasciitis is to avoid the activities that aggravate your plantar fascia. Contact a health care provider if:  Your symptoms do not go away after treatment with home care measures.  Your pain gets worse.  Your pain affects your ability to move or do your daily activities. This information is not intended to replace advice given to you by your health care provider. Make sure you discuss any questions you have with your health care provider. Document Released: 02/21/2001 Document Revised: 11/01/2015 Document Reviewed: 04/08/2014 Elsevier Interactive Patient Education  2018 [place]. Pain in legs lkely from arthritis in spine

## 2017-02-14 ENCOUNTER — Other Ambulatory Visit: Payer: Self-pay | Admitting: Family Medicine

## 2017-02-14 ENCOUNTER — Telehealth: Payer: Self-pay | Admitting: Family Medicine

## 2017-02-14 LAB — C. DIFFICILE GDH AND TOXIN A/B
C. difficile GDH: DETECTED — AB
C. difficile Toxin A/B: NOT DETECTED

## 2017-02-14 LAB — CLOSTRIDIUM DIFFICILE BY PCR: Toxigenic C. Difficile by PCR: DETECTED — CR

## 2017-02-14 MED ORDER — VANCOMYCIN HCL 125 MG PO CAPS: 125.0000 mg | ORAL_CAPSULE | Freq: Four times a day (QID) | ORAL | 0 refills | Status: DC

## 2017-02-14 NOTE — Telephone Encounter (Signed)
Karen [name] from [facility] called regarding patient- critical lab C-diff is positive

## 2017-02-14 NOTE — Telephone Encounter (Signed)
Pt aware and vancomycin is prescribed sent to [facility]

## 2017-02-18 DIAGNOSIS — R197 Diarrhea, unspecified: Secondary | ICD-10-CM | POA: Insufficient documentation

## 2017-02-18 DIAGNOSIS — M722 Plantar fascial fibromatosis: Secondary | ICD-10-CM | POA: Insufficient documentation

## 2017-02-18 NOTE — Assessment & Plan Note (Addendum)
Pt education done, will call for referral to podiatry if needed, pain started 2 months ago after excess foot trauma

## 2017-02-18 NOTE — Assessment & Plan Note (Signed)
Recent C dif exposure potentially with past h/o severe C dif infection, stool to be sent for testing

## 2017-02-18 NOTE — Assessment & Plan Note (Signed)
Elevated SBP, no med change DASH diet and commitment to daily physical activity for a minimum of 30 minutes discussed and encouraged, as a part of hypertension management. The importance of attaining a healthy weight is also discussed.  BP/Weight 02/13/2017 07/18/2016 05/02/2016 11/03/2015 04/07/2015 01/12/2015 4/35/6861  Systolic BP 683 729 021 115 520 802 233  Diastolic BP 70 78 60 80 76 74 68  Wt. (Lbs) 220 221 217 216 204 - 203.12  BMI 35.51 35.67 35.02 34.88 32.94 - 32.3      re evaluate in 6 weeks, excess weight gain

## 2017-02-18 NOTE — Assessment & Plan Note (Signed)
Hyperlipidemia:Low fat diet discussed and encouraged.   Lipid Panel  Lab Results  Component Value Date   CHOL 154 02/09/2017   HDL 55 02/09/2017   LDLCALC 85 02/09/2017   TRIG 69 02/09/2017   CHOLHDL 2.8 02/09/2017   Controlled, no change in medication

## 2017-02-18 NOTE — Assessment & Plan Note (Signed)
Unchnaged. Patient re-educated about  the importance of commitment to a  minimum of 150 minutes of exercise per week.  The importance of healthy food choices with portion control discussed. Encouraged to start a food diary, count calories and to consider  joining a support group. Sample diet sheets offered. Goals set by the patient for the next several months.   Weight /BMI 02/13/2017 07/18/2016 05/02/2016  WEIGHT 220 lb 221 lb 217 lb  HEIGHT 5\' 6"  5\' 6"  5\' 6"   BMI 35.51 kg/m2 35.67 kg/m2 35.02 kg/m2

## 2017-02-28 ENCOUNTER — Telehealth: Payer: Self-pay | Admitting: Family Medicine

## 2017-02-28 NOTE — Telephone Encounter (Signed)
After review of note from Dr. Moshe Cipro and chart, patient was not treated for a UTI but rather for C. Diff infection in stool due to contact with a person who had C. Difficile. Patient would not need a urine specimen or have frequent urination due to C. Difficile. She is going to need to be seen and evaluated for this problem before she can be treated or just come an leave a urine specimen. Please advise. Thank you.

## 2017-02-28 NOTE — Telephone Encounter (Signed)
Patient states she was treated with medication for a bacterial infection, she is requesting to come in and give another urine sample to make sure it is cleared up. She still is frequently urinating.  Cb#: [phone]

## 2017-03-01 ENCOUNTER — Telehealth: Payer: Self-pay | Admitting: Family Medicine

## 2017-03-01 NOTE — Telephone Encounter (Signed)
No.  The test can remain positive for up to 6 weeks.  Stop the fiber for a few days and see what  happens

## 2017-03-01 NOTE — Telephone Encounter (Signed)
Patient calling to request a lab order for stool culture to find out if she is negative for C diff. She has been going to the bathroom quite often but does not know if that is related to taking the fiber.

## 2017-03-02 NOTE — Telephone Encounter (Signed)
Pt aware.

## 2017-03-06 ENCOUNTER — Telehealth: Payer: Self-pay

## 2017-03-06 NOTE — Telephone Encounter (Signed)
Left vm for pt to see if she wanted to come in the morning, 03/07/17 for AWV appointment because we have a few openings.    Josepha Pigg, B.A.  Care Guide [phone]

## 2017-03-07 ENCOUNTER — Ambulatory Visit: Payer: Commercial Managed Care - HMO

## 2017-03-12 ENCOUNTER — Other Ambulatory Visit: Payer: Self-pay | Admitting: Family Medicine

## 2017-03-15 ENCOUNTER — Telehealth: Payer: Self-pay | Admitting: Family Medicine

## 2017-03-15 DIAGNOSIS — R197 Diarrhea, unspecified: Secondary | ICD-10-CM

## 2017-03-15 NOTE — Telephone Encounter (Signed)
Patient requesting another order for stool sample to check for C-DIFF.

## 2017-03-15 NOTE — Telephone Encounter (Signed)
I spoke with pt, still having urgency with defecation and abdominal cramps, has ahd complicated C dif in the past  pLs  Order test for C dif and  Let her know she can go to lab to collect the container and submit specimen once you have been able to do this, tx

## 2017-03-16 NOTE — Telephone Encounter (Signed)
Patient informed. 

## 2017-03-19 ENCOUNTER — Other Ambulatory Visit: Payer: Self-pay | Admitting: Family Medicine

## 2017-03-19 DIAGNOSIS — R197 Diarrhea, unspecified: Secondary | ICD-10-CM | POA: Diagnosis not present

## 2017-03-20 ENCOUNTER — Encounter: Payer: Self-pay | Admitting: Family Medicine

## 2017-03-20 LAB — C. DIFFICILE GDH AND TOXIN A/B
GDH ANTIGEN: NOT DETECTED
MICRO NUMBER: 81120153
SPECIMEN QUALITY: ADEQUATE
TOXIN A AND B: NOT DETECTED

## 2017-03-28 ENCOUNTER — Ambulatory Visit: Payer: Commercial Managed Care - HMO

## 2017-04-11 ENCOUNTER — Ambulatory Visit (INDEPENDENT_AMBULATORY_CARE_PROVIDER_SITE_OTHER): Payer: Commercial Managed Care - HMO

## 2017-04-11 VITALS — BP 138/76 | HR 72 | Temp 98.4°F | Ht 66.0 in | Wt 223.0 lb

## 2017-04-11 DIAGNOSIS — Z1211 Encounter for screening for malignant neoplasm of colon: Secondary | ICD-10-CM | POA: Diagnosis not present

## 2017-04-11 DIAGNOSIS — Z0000 Encounter for general adult medical examination without abnormal findings: Secondary | ICD-10-CM

## 2017-04-11 NOTE — Progress Notes (Signed)
Subjective:   Karen [name] is a 71 y.o. female who presents for Medicare Annual (Subsequent) preventive examination.  Review of Systems: N/A Cardiac Risk Factors include: advanced age (>17men, >69 women);dyslipidemia;hypertension;obesity (BMI >30kg/m2)     Objective:     Vitals: BP 138/76   Pulse 72   Temp 98.4 F (36.9 C) (Oral)   Ht 5\' 6"  (1.676 m)   Wt 223 lb 0.6 oz (101.2 kg)   BMI 36.00 kg/m   Body mass index is 36 kg/m.   Tobacco History  Smoking Status  . Never Smoker  Smokeless Tobacco  . Never Used     Counseling given: Not Answered   Past Medical History:  Diagnosis Date  . Arthritis   . C. difficile colitis Jul 18, 2013   Vancomycin. Recheck Cdiff positive. Vancomycin continued for an additional 2 weeks  . H/O herpes zoster   . Hyperlipidemia   . Hypertension   . Metabolic syndrome X   . Prediabetes 2014   lifestyle   . Prediabetes    Past Surgical History:  Procedure Laterality Date  . ABDOMINAL HYSTERECTOMY  1983 approx   fibroids, partial  . ANKLE SURGERY    . COLONOSCOPY  04/27/2005   [facility]:[id number] polyp at the splenic flexure cold biopsied/removed. Mid  descending polyp removed with cold snare technique. The remainder of the of the colon looked normal/ Normal rectum. inflammed adenomatous polyps.  . COLONOSCOPY N/A 05/26/2013   Dr. Rourk:Multiple colonic polyps removed, tubular adenomas. Needs surveillance Dec 2017.   Marland Kitchen FRACTURE SURGERY Right mid 1990's   pin put in and waas removed   Family History  Problem Relation Age of Onset  . Hypertension Mother   . Lung cancer Mother   . Hypertension Father   . Hypertension Sister   . Hypertension Brother   . Hypertension Sister   . Diabetes Sister   . Hypertension Son   . Hypertension Son    History  Sexual Activity  . Sexual activity: Not Currently  . Birth control/ protection: Surgical    Outpatient Encounter Prescriptions as of 04/11/2017  Medication Sig  . aspirin 81 MG  tablet Take 81 mg by mouth daily.  . Cholecalciferol (D3-1000) 1000 UNITS tablet Take 2,000 Units by mouth daily.   . Coenzyme Q10 (CO Q 10 PO) Take 1 tablet by mouth daily.   Marland Kitchen losartan-hydrochlorothiazide (HYZAAR) 100-12.5 MG tablet Take 1 tablet by mouth daily.  . simvastatin (ZOCOR) 10 MG tablet TAKE 1 TABLET BY MOUTH AT BEDTIME *STOP ZETIA*  . [DISCONTINUED] vancomycin (VANCOCIN) 125 MG capsule Take 1 capsule (125 mg total) by mouth 4 (four) times daily.   No facility-administered encounter medications on file as of 04/11/2017.     Activities of Daily Living In your present state of health, do you have any difficulty performing the following activities: 04/11/2017 05/03/2016  Hearing? N N  Vision? N N  Difficulty concentrating or making decisions? N N  Walking or climbing stairs? N N  Dressing or bathing? N N  Doing errands, shopping? N N  Preparing Food and eating ? N -  Using the Toilet? N -  In the past six months, have you accidently leaked urine? N -  Do you have problems with loss of bowel control? N -  Managing your Medications? N -  Managing your Finances? N -  Housekeeping or managing your Housekeeping? N -  Some recent data might be hidden    Patient Care Team: [name]  E, MD as PCP - General Rourk, Cristopher Estimable, MD as Consulting Physician (Gastroenterology)    Assessment:    Exercise Activities and Dietary recommendations Current Exercise Habits: Home exercise routine, Type of exercise: walking, Time (Minutes): 30, Frequency (Times/Week): 1, Weekly Exercise (Minutes/Week): 30, Intensity: Mild, Exercise limited by: None identified  Goals    . Exercise 3x per week (30 min per time)          Recommend you increase your exercise program at least 3 days a week for 30-45 minutes at a time as tolerated.        Fall Risk Fall Risk  04/11/2017 02/13/2017 05/02/2016 04/07/2015 04/07/2015  Falls in the past year? No No No Yes No  Risk for fall due to : - - -  Other (Comment) -  Risk for fall due to: Comment - - - tripped in airport -   Depression Screen PHQ 2/9 Scores 04/11/2017 02/13/2017 05/02/2016 04/07/2015  PHQ - 2 Score 0 0 0 1  PHQ- 9 Score - - - -     Cognitive Function: Normal   6CIT Screen 04/11/2017  What Year? 0 points  What month? 0 points  What time? 0 points  Count back from 20 0 points  Months in reverse 0 points  Repeat phrase 0 points  Total Score 0    Immunization History  Administered Date(s) Administered  . H1N1 04/17/2008  . Influenza Whole 03/29/2006, 07/30/2008, 03/26/2009  . Influenza,inj,Quad PF,6+ Mos 02/20/2013, 03/23/2014, 04/07/2015, 03/21/2016, 02/13/2017  . Pneumococcal Conjugate-13 07/16/2014  . Pneumococcal Polysaccharide-23 02/20/2013  . Td 04/27/2004  . Varicella 02/22/2013   Screening Tests Health Maintenance  Topic Date Due  . COLONOSCOPY  05/26/2016  . TETANUS/TDAP  07/05/2017 (Originally 04/27/2014)  . MAMMOGRAM  07/10/2018  . INFLUENZA VACCINE  Completed  . DEXA SCAN  Completed  . Hepatitis C Screening  Completed  . PNA vac Low Risk Adult  Completed      Plan:   I have personally reviewed and noted the following in the patient's chart:   . Medical and social history . Use of alcohol, tobacco or illicit drugs  . Current medications and supplements . Functional ability and status . Nutritional status . Physical activity . Advanced directives . List of other physicians . Hospitalizations, surgeries, and ER visits in previous 12 months . Vitals . Screenings to include cognitive, depression, and falls . Referrals and appointments: Referral sent to Dr. Gala Romney today for colonoscopy.   In addition, I have reviewed and discussed with patient certain preventive protocols, quality metrics, and best practice recommendations. A written personalized care plan for preventive services as well as general preventive health recommendations were provided to patient.     Stormy Fabian,  LPN  [id number]

## 2017-04-11 NOTE — Patient Instructions (Addendum)
Karen [name] , Thank you for taking time to come for your Medicare Wellness Visit. I appreciate your ongoing commitment to your health goals. Please review the following plan we discussed and let me know if I can assist you in the future.   Screening recommendations/referrals: Colonoscopy: Overdue, referral sent to Dr. Gala Romney today Mammogram: Up to date, next due 06/2017 Bone Density: Up to date Recommended yearly ophthalmology/optometry visit for glaucoma screening and checkup Recommended yearly dental visit for hygiene and checkup  Vaccinations: Influenza vaccine: Up to date Pneumococcal vaccine: Up to date Tdap vaccine: Due, declines  Shingles vaccine: Completed    Advanced directives: Advance directive discussed with you today. I have provided a copy for you to complete at home and have notarized. Once this is complete please bring a copy in to our office so we can scan it into your chart.  Conditions/risks identified: Obese, recommend you increase your exercise program at least 3 days a week for 30-45 minutes at a time as tolerated.   Next appointment: Follow up with Dr. Moshe Cipro on 08/13/2017 at 9:00 am. Follow up in 1 year for your annual wellness visit.  Preventive Care 7 Years and Older, Female Preventive care refers to lifestyle choices and visits with your health care provider that can promote health and wellness. What does preventive care include?  A yearly physical exam. This is also called an annual well check.  Dental exams once or twice a year.  Routine eye exams. Ask your health care provider how often you should have your eyes checked.  Personal lifestyle choices, including:  Daily care of your teeth and gums.  Regular physical activity.  Eating a healthy diet.  Avoiding tobacco and drug use.  Limiting alcohol use.  Practicing safe sex.  Taking low-dose aspirin every day.  Taking vitamin and mineral supplements as recommended by your health care  provider. What happens during an annual well check? The services and screenings done by your health care provider during your annual well check will depend on your age, overall health, lifestyle risk factors, and family history of disease. Counseling  Your health care provider may ask you questions about your:  Alcohol use.  Tobacco use.  Drug use.  Emotional well-being.  Home and relationship well-being.  Sexual activity.  Eating habits.  History of falls.  Memory and ability to understand (cognition).  Work and work Statistician.  Reproductive health. Screening  You may have the following tests or measurements:  Height, weight, and BMI.  Blood pressure.  Lipid and cholesterol levels. These may be checked every 5 years, or more frequently if you are over 69 years old.  Skin check.  Lung cancer screening. You may have this screening every year starting at age 19 if you have a 30-pack-year history of smoking and currently smoke or have quit within the past 15 years.  Fecal occult blood test (FOBT) of the stool. You may have this test every year starting at age 84.  Flexible sigmoidoscopy or colonoscopy. You may have a sigmoidoscopy every 5 years or a colonoscopy every 10 years starting at age 53.  Hepatitis C blood test.  Hepatitis B blood test.  Sexually transmitted disease (STD) testing.  Diabetes screening. This is done by checking your blood sugar (glucose) after you have not eaten for a while (fasting). You may have this done every 1-3 years.  Bone density scan. This is done to screen for osteoporosis. You may have this done starting at age 57.  Mammogram. This may be done every 1-2 years. Talk to your health care provider about how often you should have regular mammograms. Talk with your health care provider about your test results, treatment options, and if necessary, the need for more tests. Vaccines  Your health care provider may recommend certain  vaccines, such as:  Influenza vaccine. This is recommended every year.  Tetanus, diphtheria, and acellular pertussis (Tdap, Td) vaccine. You may need a Td booster every 10 years.  Zoster vaccine. You may need this after age 59.  Pneumococcal 13-valent conjugate (PCV13) vaccine. One dose is recommended after age 22.  Pneumococcal polysaccharide (PPSV23) vaccine. One dose is recommended after age 69. Talk to your health care provider about which screenings and vaccines you need and how often you need them. This information is not intended to replace advice given to you by your health care provider. Make sure you discuss any questions you have with your health care provider. Document Released: 06/25/2015 Document Revised: 02/16/2016 Document Reviewed: 03/30/2015 Elsevier Interactive Patient Education  2017 [place] Prevention in the Home Falls can cause injuries. They can happen to people of all ages. There are many things you can do to make your home safe and to help prevent falls. What can I do on the outside of my home?  Regularly fix the edges of walkways and driveways and fix any cracks.  Remove anything that might make you trip as you walk through a door, such as a raised step or threshold.  Trim any bushes or trees on the path to your home.  Use bright outdoor lighting.  Clear any walking paths of anything that might make someone trip, such as rocks or tools.  Regularly check to see if handrails are loose or broken. Make sure that both sides of any steps have handrails.  Any raised decks and porches should have guardrails on the edges.  Have any leaves, snow, or ice cleared regularly.  Use sand or salt on walking paths during winter.  Clean up any spills in your garage right away. This includes oil or grease spills. What can I do in the bathroom?  Use night lights.  Install grab bars by the toilet and in the tub and shower. Do not use towel bars as grab  bars.  Use non-skid mats or decals in the tub or shower.  If you need to sit down in the shower, use a plastic, non-slip stool.  Keep the floor dry. Clean up any water that spills on the floor as soon as it happens.  Remove soap buildup in the tub or shower regularly.  Attach bath mats securely with double-sided non-slip rug tape.  Do not have throw rugs and other things on the floor that can make you trip. What can I do in the bedroom?  Use night lights.  Make sure that you have a light by your bed that is easy to reach.  Do not use any sheets or blankets that are too big for your bed. They should not hang down onto the floor.  Have a firm chair that has side arms. You can use this for support while you get dressed.  Do not have throw rugs and other things on the floor that can make you trip. What can I do in the kitchen?  Clean up any spills right away.  Avoid walking on wet floors.  Keep items that you use a lot in easy-to-reach places.  If you need to reach  something above you, use a strong step stool that has a grab bar.  Keep electrical cords out of the way.  Do not use floor polish or wax that makes floors slippery. If you must use wax, use non-skid floor wax.  Do not have throw rugs and other things on the floor that can make you trip. What can I do with my stairs?  Do not leave any items on the stairs.  Make sure that there are handrails on both sides of the stairs and use them. Fix handrails that are broken or loose. Make sure that handrails are as long as the stairways.  Check any carpeting to make sure that it is firmly attached to the stairs. Fix any carpet that is loose or worn.  Avoid having throw rugs at the top or bottom of the stairs. If you do have throw rugs, attach them to the floor with carpet tape.  Make sure that you have a light switch at the top of the stairs and the bottom of the stairs. If you do not have them, ask someone to add them for  you. What else can I do to help prevent falls?  Wear shoes that:  Do not have high heels.  Have rubber bottoms.  Are comfortable and fit you well.  Are closed at the toe. Do not wear sandals.  If you use a stepladder:  Make sure that it is fully opened. Do not climb a closed stepladder.  Make sure that both sides of the stepladder are locked into place.  Ask someone to hold it for you, if possible.  Clearly mark and make sure that you can see:  Any grab bars or handrails.  First and last steps.  Where the edge of each step is.  Use tools that help you move around (mobility aids) if they are needed. These include:  Canes.  Walkers.  Scooters.  Crutches.  Turn on the lights when you go into a dark area. Replace any light bulbs as soon as they burn out.  Set up your furniture so you have a clear path. Avoid moving your furniture around.  If any of your floors are uneven, fix them.  If there are any pets around you, be aware of where they are.  Review your medicines with your doctor. Some medicines can make you feel dizzy. This can increase your chance of falling. Ask your doctor what other things that you can do to help prevent falls. This information is not intended to replace advice given to you by your health care provider. Make sure you discuss any questions you have with your health care provider. Document Released: 03/25/2009 Document Revised: 11/04/2015 Document Reviewed: 07/03/2014 Elsevier Interactive Patient Education  2017 [employer].

## 2017-04-16 ENCOUNTER — Telehealth: Payer: Self-pay

## 2017-04-16 NOTE — Telephone Encounter (Signed)
Pt is calling to schedule colonoscopy. No GI problems, no blood thinners or hx of heart attacks. Please call [phone]

## 2017-04-17 ENCOUNTER — Telehealth: Payer: Self-pay

## 2017-04-17 NOTE — Telephone Encounter (Signed)
See separate triage.  

## 2017-04-18 NOTE — Telephone Encounter (Signed)
Gastroenterology Pre-Procedure Review  Request Date: 04/17/2017 Requesting Physician: Dr. Moshe Cipro  PATIENT REVIEW QUESTIONS: The patient responded to the following health history questions as indicated:    Pt's last colonoscopy was 05/26/2013 with adenomatous polyps/ Dr. Gala Romney recommended the next in 3 years  1. Diabetes Melitis: no 2. Joint replacements in the past 12 months: no 3. Major health problems in the past 3 months: no 4. Has an artificial valve or MVP: no 5. Has a defibrillator: no 6. Has been advised in past to take antibiotics in advance of a procedure like teeth cleaning: no 7. Family history of colon cancer: no  8. Alcohol Use: no 9. History of sleep apnea: no  10. History of coronary artery or other vascular stents placed within the last 12 months: no 11. History of any prior anesthesia complications: no    MEDICATIONS & ALLERGIES:    Patient reports the following regarding taking any blood thinners:   Plavix? no Aspirin? YES Coumadin? no Brilinta? no Xarelto? no Eliquis? no Pradaxa? no Savaysa? no Effient? no  Patient confirms/reports the following medications:  Current Outpatient Medications  Medication Sig Dispense Refill  . aspirin 81 MG tablet Take 81 mg by mouth daily.    . Cholecalciferol (D3-1000) 1000 UNITS tablet Take 2,000 Units by mouth daily.     . Coenzyme Q10 (CO Q 10 PO) Take 1 tablet by mouth daily.     Marland Kitchen losartan-hydrochlorothiazide (HYZAAR) 100-12.5 MG tablet Take 1 tablet by mouth daily. 30 tablet 3  . simvastatin (ZOCOR) 10 MG tablet TAKE 1 TABLET BY MOUTH AT BEDTIME *STOP ZETIA* 90 tablet 1   No current facility-administered medications for this visit.     Patient confirms/reports the following allergies:  Allergies  Allergen Reactions  . Penicillins Rash    No orders of the defined types were placed in this encounter.   AUTHORIZATION INFORMATION Primary Insurance:   ID #:   Group #:  Pre-Cert / Auth required: Pre-Cert /  Auth #:   Secondary Insurance:   ID #:  Group #:  Pre-Cert / Auth required: Pre-Cert / Auth #:   SCHEDULE INFORMATION: Procedure has been scheduled as follows:  Date:                   Time:   Location:   This Gastroenterology Pre-Precedure Review Form is being routed to the following provider(s): R. Garfield Cornea, MD

## 2017-04-19 ENCOUNTER — Other Ambulatory Visit: Payer: Self-pay

## 2017-04-19 DIAGNOSIS — Z86010 Personal history of colonic polyps: Secondary | ICD-10-CM

## 2017-04-19 MED ORDER — PEG 3350-KCL-NA BICARB-NACL 420 G PO SOLR: 4000.0000 mL | ORAL | 0 refills | Status: DC

## 2017-04-19 NOTE — Telephone Encounter (Signed)
Ok to schedule.

## 2017-04-19 NOTE — Telephone Encounter (Signed)
Pt is scheduled for 05/24/2017 at 1:00 pm with Dr. Gala Romney.

## 2017-04-19 NOTE — Telephone Encounter (Signed)
LMOM to call for date and time for procedure.

## 2017-04-19 NOTE — Telephone Encounter (Signed)
Rx sent to the pharmacy and instructions mailed to pt.  

## 2017-04-24 ENCOUNTER — Telehealth: Payer: Self-pay | Admitting: *Deleted

## 2017-04-24 NOTE — Telephone Encounter (Signed)
Patient called left message stating her medications has been recalled, patient states the nurse can call her back tomorrow and she will go over them with her. Please advise

## 2017-04-25 NOTE — Telephone Encounter (Signed)
Patient called stating it is her blood pressure medication that was recalled, patient would like to take a different medication all together. Please call patient and let her know what to do.

## 2017-04-26 ENCOUNTER — Other Ambulatory Visit: Payer: Self-pay | Admitting: Family Medicine

## 2017-04-26 MED ORDER — SPIRONOLACTONE 25 MG PO TABS: 25.0000 mg | ORAL_TABLET | Freq: Every day | ORAL | 1 refills | Status: DC

## 2017-04-26 MED ORDER — AMLODIPINE BESYLATE 5 MG PO TABS: 5.0000 mg | ORAL_TABLET | Freq: Every day | ORAL | 1 refills | Status: DC

## 2017-04-26 NOTE — Telephone Encounter (Signed)
Discontinue hyzaar , start in am amlodipine 5 mg daily and spironolactone 25 mg daily, needs nurse vist , nad to call for appt in 5 weeks

## 2017-04-26 NOTE — Telephone Encounter (Signed)
Patients med wasn't one of the lot numbers that were recalled but patient still wants med changed all together anyway to be safe. Please advise

## 2017-05-02 ENCOUNTER — Telehealth: Payer: Self-pay | Admitting: *Deleted

## 2017-05-02 ENCOUNTER — Other Ambulatory Visit: Payer: Self-pay | Admitting: Family Medicine

## 2017-05-02 MED ORDER — LOSARTAN POTASSIUM-HCTZ 100-12.5 MG PO TABS: 1.0000 | ORAL_TABLET | Freq: Every day | ORAL | 3 refills | Status: DC

## 2017-05-02 NOTE — Telephone Encounter (Signed)
I spoke with the pt and the losartan has been prescribed , amlodipine allergy is documented

## 2017-05-02 NOTE — Telephone Encounter (Signed)
New BP med causing rash. Wants to go back on Losartan (the lot number they had wasn't included in the recall) so she wants to go back on it

## 2017-05-02 NOTE — Telephone Encounter (Signed)
Patient called stating the blood pressure medicine she is taking now is breaking her out in a rash on her ears and arms and it itches really bad. Patient wants to go back to taking lostartin that she was on because the one she was taking wasn't the one recalled. Patient asked for Dr Moshe Cipro to return her call [phone]

## 2017-05-19 DIAGNOSIS — L508 Other urticaria: Secondary | ICD-10-CM | POA: Diagnosis not present

## 2017-05-23 ENCOUNTER — Telehealth: Payer: Self-pay

## 2017-05-23 ENCOUNTER — Telehealth: Payer: Self-pay | Admitting: Internal Medicine

## 2017-05-23 NOTE — Telephone Encounter (Signed)
Called pt. TCS for tomorrow moved up to 8:30am, she will arrive at 7:30am. Advised her to start 2nd half of prep tomorrow morning at 3:30am and NPO after 5:30am. Endo scheduler is aware.  Dr. Gala Romney, pt wanted to let you know she started Prednisone dose pack Saturday for a rash.

## 2017-05-23 NOTE — Telephone Encounter (Signed)
Pt was returning a call to MB about moving her procedure time up tomorrow and when to start doing her prep. Please call her back at [phone]

## 2017-05-23 NOTE — Telephone Encounter (Signed)
Called pt. She wanted to clarify her new times for prep since procedure was moved up to 8:30am tomorrow. (see previous phone note).

## 2017-05-24 ENCOUNTER — Ambulatory Visit (HOSPITAL_COMMUNITY)
Admission: RE | Admit: 2017-05-24 | Discharge: 2017-05-24 | Disposition: A | Discharge status: Home Health | Payer: Medicare HMO | Source: Ambulatory Visit | Attending: Internal Medicine | Admitting: Internal Medicine

## 2017-05-24 ENCOUNTER — Other Ambulatory Visit: Payer: Self-pay

## 2017-05-24 ENCOUNTER — Encounter (HOSPITAL_COMMUNITY): Payer: Self-pay | Admitting: *Deleted

## 2017-05-24 ENCOUNTER — Encounter (HOSPITAL_COMMUNITY)
Admission: RE | Disposition: A | Discharge status: Home Health | Payer: Self-pay | Source: Ambulatory Visit | Attending: Internal Medicine

## 2017-05-24 DIAGNOSIS — K5730 Diverticulosis of large intestine without perforation or abscess without bleeding: Secondary | ICD-10-CM | POA: Diagnosis not present

## 2017-05-24 DIAGNOSIS — Z90710 Acquired absence of both cervix and uterus: Secondary | ICD-10-CM | POA: Diagnosis not present

## 2017-05-24 DIAGNOSIS — Z7982 Long term (current) use of aspirin: Secondary | ICD-10-CM | POA: Insufficient documentation

## 2017-05-24 DIAGNOSIS — Z880 Allergy status to penicillin: Secondary | ICD-10-CM | POA: Diagnosis not present

## 2017-05-24 DIAGNOSIS — E785 Hyperlipidemia, unspecified: Secondary | ICD-10-CM | POA: Diagnosis not present

## 2017-05-24 DIAGNOSIS — I10 Essential (primary) hypertension: Secondary | ICD-10-CM | POA: Insufficient documentation

## 2017-05-24 DIAGNOSIS — R7303 Prediabetes: Secondary | ICD-10-CM | POA: Diagnosis not present

## 2017-05-24 DIAGNOSIS — Z8719 Personal history of other diseases of the digestive system: Secondary | ICD-10-CM | POA: Insufficient documentation

## 2017-05-24 DIAGNOSIS — Z1211 Encounter for screening for malignant neoplasm of colon: Secondary | ICD-10-CM | POA: Insufficient documentation

## 2017-05-24 DIAGNOSIS — E8881 Metabolic syndrome: Secondary | ICD-10-CM | POA: Insufficient documentation

## 2017-05-24 DIAGNOSIS — Z86010 Personal history of colonic polyps: Secondary | ICD-10-CM | POA: Insufficient documentation

## 2017-05-24 DIAGNOSIS — D123 Benign neoplasm of transverse colon: Secondary | ICD-10-CM | POA: Diagnosis not present

## 2017-05-24 DIAGNOSIS — Z79899 Other long term (current) drug therapy: Secondary | ICD-10-CM | POA: Insufficient documentation

## 2017-05-24 DIAGNOSIS — M1990 Unspecified osteoarthritis, unspecified site: Secondary | ICD-10-CM | POA: Insufficient documentation

## 2017-05-24 HISTORY — PX: COLONOSCOPY: SHX5424

## 2017-05-24 SURGERY — COLONOSCOPY
Anesthesia: Moderate Sedation

## 2017-05-24 MED ORDER — STERILE WATER FOR IRRIGATION IR SOLN
Status: DC | PRN
  Administered 2017-05-24: 08:00:00

## 2017-05-24 MED ORDER — MEPERIDINE HCL 100 MG/ML IJ SOLN
INTRAMUSCULAR | Status: AC
  Filled 2017-05-24: qty 2

## 2017-05-24 MED ORDER — MIDAZOLAM HCL 5 MG/5ML IJ SOLN
INTRAMUSCULAR | Status: AC
  Filled 2017-05-24: qty 10

## 2017-05-24 MED ORDER — MEPERIDINE HCL 100 MG/ML IJ SOLN
INTRAMUSCULAR | Status: DC | PRN
  Administered 2017-05-24: 50 mg via INTRAVENOUS
  Administered 2017-05-24: 25 mg via INTRAVENOUS

## 2017-05-24 MED ORDER — ONDANSETRON HCL 4 MG/2ML IJ SOLN
INTRAMUSCULAR | Status: AC
  Filled 2017-05-24: qty 2

## 2017-05-24 MED ORDER — SODIUM CHLORIDE 0.9 % IV SOLN
INTRAVENOUS | Status: DC
  Administered 2017-05-24: 08:00:00 via INTRAVENOUS

## 2017-05-24 MED ORDER — MIDAZOLAM HCL 5 MG/5ML IJ SOLN
INTRAMUSCULAR | Status: DC | PRN
  Administered 2017-05-24: 1 mg via INTRAVENOUS
  Administered 2017-05-24: 2 mg via INTRAVENOUS

## 2017-05-24 MED ORDER — ONDANSETRON HCL 4 MG/2ML IJ SOLN
INTRAMUSCULAR | Status: DC | PRN
  Administered 2017-05-24: 4 mg via INTRAVENOUS

## 2017-05-24 NOTE — Discharge Instructions (Signed)

## 2017-05-24 NOTE — Op Note (Signed)
[facility] Patient Name: Karen [name] Procedure Date: 05/24/2017 8:12 AM MRN: [id number] Date of Birth: 03-Sep-1945 Attending MD: Norvel Richards , MD CSN: [id number] Age: 71 Admit Type: Outpatient Procedure:                Colonoscopy Indications:              High risk colon cancer surveillance: Personal                            history of colonic polyps Providers:                Norvel Richards, MD, Janeece Riggers, RN, Hinton Rao, RN Referring MD:              Medicines:                Midazolam 3 mg IV, Meperidine 75 mg IV, Ondansetron                            4 mg IV Complications:            No immediate complications. Estimated Blood Loss:     Estimated blood loss: none. Procedure:                Pre-Anesthesia Assessment:                           - Prior to the procedure, a History and Physical                            was performed, and patient medications and                            allergies were reviewed. The patient's tolerance of                            previous anesthesia was also reviewed. The risks                            and benefits of the procedure and the sedation                            options and risks were discussed with the patient.                            All questions were answered, and informed consent                            was obtained. Prior Anticoagulants: The patient has                            taken no previous anticoagulant or antiplatelet  agents. ASA Grade Assessment: II - A patient with                            mild systemic disease. After reviewing the risks                            and benefits, the patient was deemed in                            satisfactory condition to undergo the procedure.                           After obtaining informed consent, the colonoscope                            was passed under direct vision. Throughout the                             procedure, the patient's blood pressure, pulse, and                            oxygen saturations were monitored continuously. The                            EC-[id number] ([id number]) scope was introduced through                            the anus and advanced to the the cecum, identified                            by appendiceal orifice and ileocecal valve. The                            colonoscopy was performed without difficulty. The                            patient tolerated the procedure well. The quality                            of the bowel preparation was adequate. The entire                            colon was well visualized. The ileocecal valve,                            appendiceal orifice, and rectum were photographed. Scope In: 8:27:36 AM Scope Out: 8:39:57 AM Scope Withdrawal Time: 0 hours 9 minutes 13 seconds  Total Procedure Duration: 0 hours 12 minutes 21 seconds  Findings:      The perianal and digital rectal examinations were normal.      Multiple small and large-mouthed diverticula were found in the entire       colon.      The exam was otherwise without abnormality on direct and retroflexion  views.      A 5 mm polyp was found in the splenic flexure. The polyp was sessile.       The polyp was removed with a cold snare. Resection and retrieval were       complete. Estimated blood loss was minimal. Impression:               - Diverticulosis in the entire examined colon.                           - The examination was otherwise normal on direct                            and retroflexion views.                           - No specimens collected. Moderate Sedation:      Moderate (conscious) sedation was administered by the endoscopy nurse       and supervised by the endoscopist. The following parameters were       monitored: oxygen saturation, heart rate, blood pressure, respiratory       rate, EKG, adequacy of pulmonary ventilation,  and response to care.       Total physician intraservice time was 13 minutes. Recommendation:           - Patient has a contact number available for                            emergencies. The signs and symptoms of potential                            delayed complications were discussed with the                            patient. Return to normal activities tomorrow.                            Written discharge instructions were provided to the                            patient.                           - Resume previous diet.                           - Continue present medications.                           - Await pathology results.                           - Repeat colonoscopy date to be determined after                            pending pathology results are reviewed for  surveillance based on pathology results.                           - Return to GI clinic (date not yet determined). Procedure Code(s):        --- Professional ---                           [phone], Colonoscopy, flexible; with removal of                            tumor(s), polyp(s), or other lesion(s) by snare                            technique                           99152, Moderate sedation services provided by the                            same physician or other qualified health care                            professional performing the diagnostic or                            therapeutic service that the sedation supports,                            requiring the presence of an independent trained                            observer to assist in the monitoring of the                            patient's level of consciousness and physiological                            status; initial 15 minutes of intraservice time,                            patient age 19 years or older Diagnosis Code(s):        --- Professional ---                           Z86.010, Personal history of  colonic polyps                           K57.30, Diverticulosis of large intestine without                            perforation or abscess without bleeding CPT copyright 2016 American Medical Association. All rights reserved. The codes documented in this report are preliminary and upon coder review may  be revised to meet current compliance requirements. Cristopher Estimable. Polina Burmaster, MD Norvel Richards, MD 05/24/2017 8:51:37 AM This report has  been signed electronically. Number of Addenda: 0

## 2017-05-24 NOTE — H&P (Signed)
@LOGO @   Primary Care Physician:  Fayrene Helper, MD Primary Gastroenterologist:  Dr. Gala Romney  Pre-Procedure History & Physical: HPI:  Karen [name] is a 71 y.o. female here for   Past Medical History:  Diagnosis Date  . Arthritis   . C. difficile colitis Jul 18, 2013   Vancomycin. Recheck Cdiff positive. Vancomycin continued for an additional 2 weeks  . H/O herpes zoster   . Hyperlipidemia   . Hypertension   . Metabolic syndrome X   . Prediabetes 2014   lifestyle   . Prediabetes     Past Surgical History:  Procedure Laterality Date  . ABDOMINAL HYSTERECTOMY  1983 approx   fibroids, partial  . ANKLE SURGERY    . COLONOSCOPY  04/27/2005   SFK:CLEXNTZGYF polyp at the splenic flexure cold biopsied/removed. Mid  descending polyp removed with cold snare technique. The remainder of the of the colon looked normal/ Normal rectum. inflammed adenomatous polyps.  . COLONOSCOPY N/A 05/26/2013   Dr. Jameica Couts:Multiple colonic polyps removed, tubular adenomas. Needs surveillance Dec 2017.   Marland Kitchen FRACTURE SURGERY Right mid 1990's   pin put in and waas removed    Prior to Admission medications   Medication Sig Start Date End Date Taking? Authorizing Provider  aspirin 81 MG tablet Take 81 mg by mouth daily.   Yes [provider]  Cholecalciferol (D3-1000) 1000 UNITS tablet Take 2,000 Units by mouth daily.    Yes [provider]  Coenzyme Q10 (CO Q 10 PO) Take 1 tablet by mouth daily.    Yes [provider]  losartan-hydrochlorothiazide (HYZAAR) 100-12.5 MG tablet Take 1 tablet by mouth daily. 05/02/17  Yes Fayrene Helper, MD  Magnesium 250 MG TABS Take 1 tablet by mouth daily.   Yes [provider]  predniSONE (STERAPRED UNI-PAK 21 TAB) 10 MG (21) TBPK tablet Take 10 mg by mouth daily.   Yes [provider]  simvastatin (ZOCOR) 10 MG tablet TAKE 1 TABLET BY MOUTH AT BEDTIME *STOP ZETIA* 03/13/17  Yes Fayrene Helper, MD    Allergies as  of 04/19/2017 - Review Complete 04/17/2017  Allergen Reaction Noted  . Penicillins Rash 03/22/2010    Family History  Problem Relation Age of Onset  . Hypertension Mother   . Lung cancer Mother   . Hypertension Father   . Hypertension Sister   . Hypertension Brother   . Hypertension Sister   . Diabetes Sister   . Hypertension Son   . Hypertension Son   . Colon cancer Neg Hx     Social History   Socioeconomic History  . Marital status: Married    Spouse name: Not on file  . Number of children: Not on file  . Years of education: 49  . Highest education level: Not on file  Social Needs  . Financial resource strain: Not on file  . Food insecurity - worry: Not on file  . Food insecurity - inability: Not on file  . Transportation needs - medical: Not on file  . Transportation needs - non-medical: Not on file  Occupational History  . Not on file  Tobacco Use  . Smoking status: Never Smoker  . Smokeless tobacco: Never Used  Substance and Sexual Activity  . Alcohol use: No  . Drug use: No  . Sexual activity: Not Currently    Birth control/protection: Surgical  Other Topics Concern  . Not on file  Social History Narrative  . Not on file    Review  of Systems: See HPI, otherwise negative ROS  Physical Exam: BP (!) 150/68   Pulse 85   Temp 98.4 F (36.9 C) (Oral)   Resp 18   Ht 5\' 6"  (1.676 m)   Wt 216 lb (98 kg)   SpO2 100%   BMI 34.86 kg/m  General:   Alert,  Well-developed, well-nourished, pleasant and cooperative in NAD Neck:  Supple; no masses or thyromegaly. No significant cervical adenopathy. Lungs:  Clear throughout to auscultation.   No wheezes, crackles, or rhonchi. No acute distress. Heart:  Regular rate and rhythm; no murmurs, clicks, rubs,  or gallops. Abdomen: Non-distended, normal bowel sounds.  Soft and nontender without appreciable mass or hepatosplenomegaly.  Pulses:  Normal pulses noted. Extremities:  Without clubbing or edema.  Impression:    71 year old lady here for surveillance colonoscopy. History multiple colonic adenomas removed over time (3 at last session)  Recommendations: I have offered the patient a surveillance colonoscopy today.  The risks, benefits, limitations, alternatives and imponderables have been reviewed with the patient. Questions have been answered. All parties are agreeable.        Notice: This dictation was prepared with Dragon dictation along with smaller phrase technology. Any transcriptional errors that result from this process are unintentional and may not be corrected upon review.

## 2017-05-25 ENCOUNTER — Encounter: Payer: Self-pay | Admitting: Internal Medicine

## 2017-05-28 ENCOUNTER — Encounter (HOSPITAL_COMMUNITY): Payer: Self-pay | Admitting: Internal Medicine

## 2017-06-01 ENCOUNTER — Ambulatory Visit: Payer: Commercial Managed Care - HMO

## 2017-06-18 ENCOUNTER — Other Ambulatory Visit: Payer: Self-pay

## 2017-06-18 ENCOUNTER — Ambulatory Visit: Payer: Medicare HMO

## 2017-06-18 VITALS — BP 144/78

## 2017-06-18 DIAGNOSIS — I10 Essential (primary) hypertension: Secondary | ICD-10-CM

## 2017-08-06 ENCOUNTER — Telehealth: Payer: Self-pay | Admitting: Family Medicine

## 2017-08-06 DIAGNOSIS — E785 Hyperlipidemia, unspecified: Secondary | ICD-10-CM

## 2017-08-06 DIAGNOSIS — I10 Essential (primary) hypertension: Secondary | ICD-10-CM

## 2017-08-06 DIAGNOSIS — E8881 Metabolic syndrome: Secondary | ICD-10-CM

## 2017-08-06 DIAGNOSIS — R7303 Prediabetes: Secondary | ICD-10-CM

## 2017-08-06 DIAGNOSIS — E559 Vitamin D deficiency, unspecified: Secondary | ICD-10-CM

## 2017-08-06 NOTE — Telephone Encounter (Signed)
Labs ordered.

## 2017-08-06 NOTE — Telephone Encounter (Signed)
Please order LABS for the PT--for this week

## 2017-08-08 DIAGNOSIS — E785 Hyperlipidemia, unspecified: Secondary | ICD-10-CM | POA: Diagnosis not present

## 2017-08-08 DIAGNOSIS — I10 Essential (primary) hypertension: Secondary | ICD-10-CM | POA: Diagnosis not present

## 2017-08-08 DIAGNOSIS — E559 Vitamin D deficiency, unspecified: Secondary | ICD-10-CM | POA: Diagnosis not present

## 2017-08-08 DIAGNOSIS — R7303 Prediabetes: Secondary | ICD-10-CM | POA: Diagnosis not present

## 2017-08-09 LAB — CBC
HCT: 38.2 % (ref 35.0–45.0)
Hemoglobin: 12.9 g/dL (ref 11.7–15.5)
MCH: 29.7 pg (ref 27.0–33.0)
MCHC: 33.8 g/dL (ref 32.0–36.0)
MCV: 87.8 fL (ref 80.0–100.0)
MPV: 10.1 fL (ref 7.5–12.5)
PLATELETS: 321 10*3/uL (ref 140–400)
RBC: 4.35 10*6/uL (ref 3.80–5.10)
RDW: 12.6 % (ref 11.0–15.0)
WBC: 4 10*3/uL (ref 3.8–10.8)

## 2017-08-09 LAB — COMPLETE METABOLIC PANEL WITH GFR
AG Ratio: 1.3 (calc) (ref 1.0–2.5)
ALBUMIN MSPROF: 3.8 g/dL (ref 3.6–5.1)
ALKALINE PHOSPHATASE (APISO): 96 U/L (ref 33–130)
ALT: 34 U/L — ABNORMAL HIGH (ref 6–29)
AST: 25 U/L (ref 10–35)
BUN: 13 mg/dL (ref 7–25)
CO2: 30 mmol/L (ref 20–32)
CREATININE: 0.79 mg/dL (ref 0.50–0.99)
Calcium: 9.9 mg/dL (ref 8.6–10.4)
Chloride: 108 mmol/L (ref 98–110)
GFR, EST AFRICAN AMERICAN: 89 mL/min/{1.73_m2} (ref >=60)
GFR, Est Non African American: 76 mL/min/{1.73_m2} (ref >=60)
GLOBULIN: 2.9 g/dL (ref 1.9–3.7)
GLUCOSE: 91 mg/dL (ref 65–99)
Potassium: 4.4 mmol/L (ref 3.5–5.3)
SODIUM: 143 mmol/L (ref 135–146)
TOTAL PROTEIN: 6.7 g/dL (ref 6.1–8.1)
Total Bilirubin: 0.7 mg/dL (ref 0.2–1.2)

## 2017-08-09 LAB — LIPID PANEL
CHOL/HDL RATIO: 3.4 (calc) (ref <5.0)
Cholesterol: 164 mg/dL (ref <200)
HDL: 48 mg/dL — ABNORMAL LOW (ref >50)
LDL CHOLESTEROL (CALC): 100 mg/dL — AB
NON-HDL CHOLESTEROL (CALC): 116 mg/dL (ref <130)
Triglycerides: 70 mg/dL (ref <150)

## 2017-08-09 LAB — HEMOGLOBIN A1C
Hgb A1c MFr Bld: 5.7 % of total Hgb — ABNORMAL HIGH (ref <5.7)
MEAN PLASMA GLUCOSE: 117 (calc)
eAG (mmol/L): 6.5 (calc)

## 2017-08-09 LAB — TSH: TSH: 1.15 m[IU]/L (ref 0.40–4.50)

## 2017-08-09 LAB — VITAMIN D 25 HYDROXY (VIT D DEFICIENCY, FRACTURES): Vit D, 25-Hydroxy: 36 ng/mL (ref 30–100)

## 2017-08-13 ENCOUNTER — Ambulatory Visit (INDEPENDENT_AMBULATORY_CARE_PROVIDER_SITE_OTHER): Payer: Medicare HMO | Admitting: Family Medicine

## 2017-08-13 ENCOUNTER — Encounter: Payer: Self-pay | Admitting: Family Medicine

## 2017-08-13 ENCOUNTER — Other Ambulatory Visit: Payer: Self-pay

## 2017-08-13 VITALS — BP 144/80 | HR 85 | Resp 16 | Ht 66.0 in | Wt 218.0 lb

## 2017-08-13 DIAGNOSIS — I83813 Varicose veins of bilateral lower extremities with pain: Secondary | ICD-10-CM

## 2017-08-13 DIAGNOSIS — I10 Essential (primary) hypertension: Secondary | ICD-10-CM

## 2017-08-13 DIAGNOSIS — E8881 Metabolic syndrome: Secondary | ICD-10-CM

## 2017-08-13 DIAGNOSIS — R5383 Other fatigue: Secondary | ICD-10-CM | POA: Insufficient documentation

## 2017-08-13 DIAGNOSIS — Z1231 Encounter for screening mammogram for malignant neoplasm of breast: Secondary | ICD-10-CM | POA: Diagnosis not present

## 2017-08-13 DIAGNOSIS — Z0000 Encounter for general adult medical examination without abnormal findings: Secondary | ICD-10-CM

## 2017-08-13 DIAGNOSIS — T733XXA Exhaustion due to excessive exertion, initial encounter: Secondary | ICD-10-CM

## 2017-08-13 DIAGNOSIS — R7302 Impaired glucose tolerance (oral): Secondary | ICD-10-CM | POA: Diagnosis not present

## 2017-08-13 DIAGNOSIS — I8393 Asymptomatic varicose veins of bilateral lower extremities: Secondary | ICD-10-CM | POA: Insufficient documentation

## 2017-08-13 MED ORDER — UNABLE TO FIND: 0 refills | Status: DC

## 2017-08-13 MED ORDER — LOSARTAN POTASSIUM-HCTZ 100-25 MG PO TABS: 1.0000 | ORAL_TABLET | Freq: Every day | ORAL | 3 refills | Status: DC

## 2017-08-13 NOTE — Assessment & Plan Note (Signed)
Knee high compression hose of 15 to 20 mm hG

## 2017-08-13 NOTE — Assessment & Plan Note (Addendum)
Uncontrolled, increase medication dose Nurse bP check in 4 weeks DASH diet and commitment to daily physical activity for a minimum of 30 minutes discussed and encouraged, as a part of hypertension management. The importance of attaining a healthy weight is also discussed.  BP/Weight 08/13/2017 06/18/2017 05/24/2017 04/11/2017 02/13/2017 07/18/2016 02/33/4356  Systolic BP 861 683 729 021 115 520 802  Diastolic BP 80 78 63 76 70 78 60  Wt. (Lbs) 218 - 216 223.04 220 221 217  BMI 35.19 - 34.86 36 35.51 35.67 35.02

## 2017-08-13 NOTE — Assessment & Plan Note (Addendum)
4 month h/o increased exertional fatigue and multiple cV risk factors  Office EKG and refer to cardiology EKG shows NSR , rate 76, no LVH, no ischemia

## 2017-08-13 NOTE — Patient Instructions (Addendum)
Nurse BP check in 4 weeks  Please schedule mammogram at checkout  Increase in dose of blood pressure medication sent in, start tomorrow  MD follow up in 6 months  You are being referred to cardiology   EKG in office today  Fasting lipid, cmp and eGFR and hBA1C in 5.5 months  Call for referral for sleep evaluation when ready pleaase

## 2017-08-16 ENCOUNTER — Ambulatory Visit (HOSPITAL_COMMUNITY)
Admission: RE | Admit: 2017-08-16 | Discharge: 2017-08-16 | Disposition: A | Discharge status: Home / Self Care | Payer: Medicare HMO | Source: Ambulatory Visit | Attending: Family Medicine | Admitting: Family Medicine

## 2017-08-16 DIAGNOSIS — Z1231 Encounter for screening mammogram for malignant neoplasm of breast: Secondary | ICD-10-CM | POA: Diagnosis not present

## 2017-08-16 IMAGING — MG DIGITAL SCREENING BILATERAL MAMMOGRAM WITH TOMO AND CAD
1 series · 1 of 1 positions shown · non-contrast
Comparison: Previous exam(s).

CLINICAL DATA: Screening.

EXAM:
DIGITAL SCREENING BILATERAL MAMMOGRAM WITH TOMO AND CAD

[R MLO]
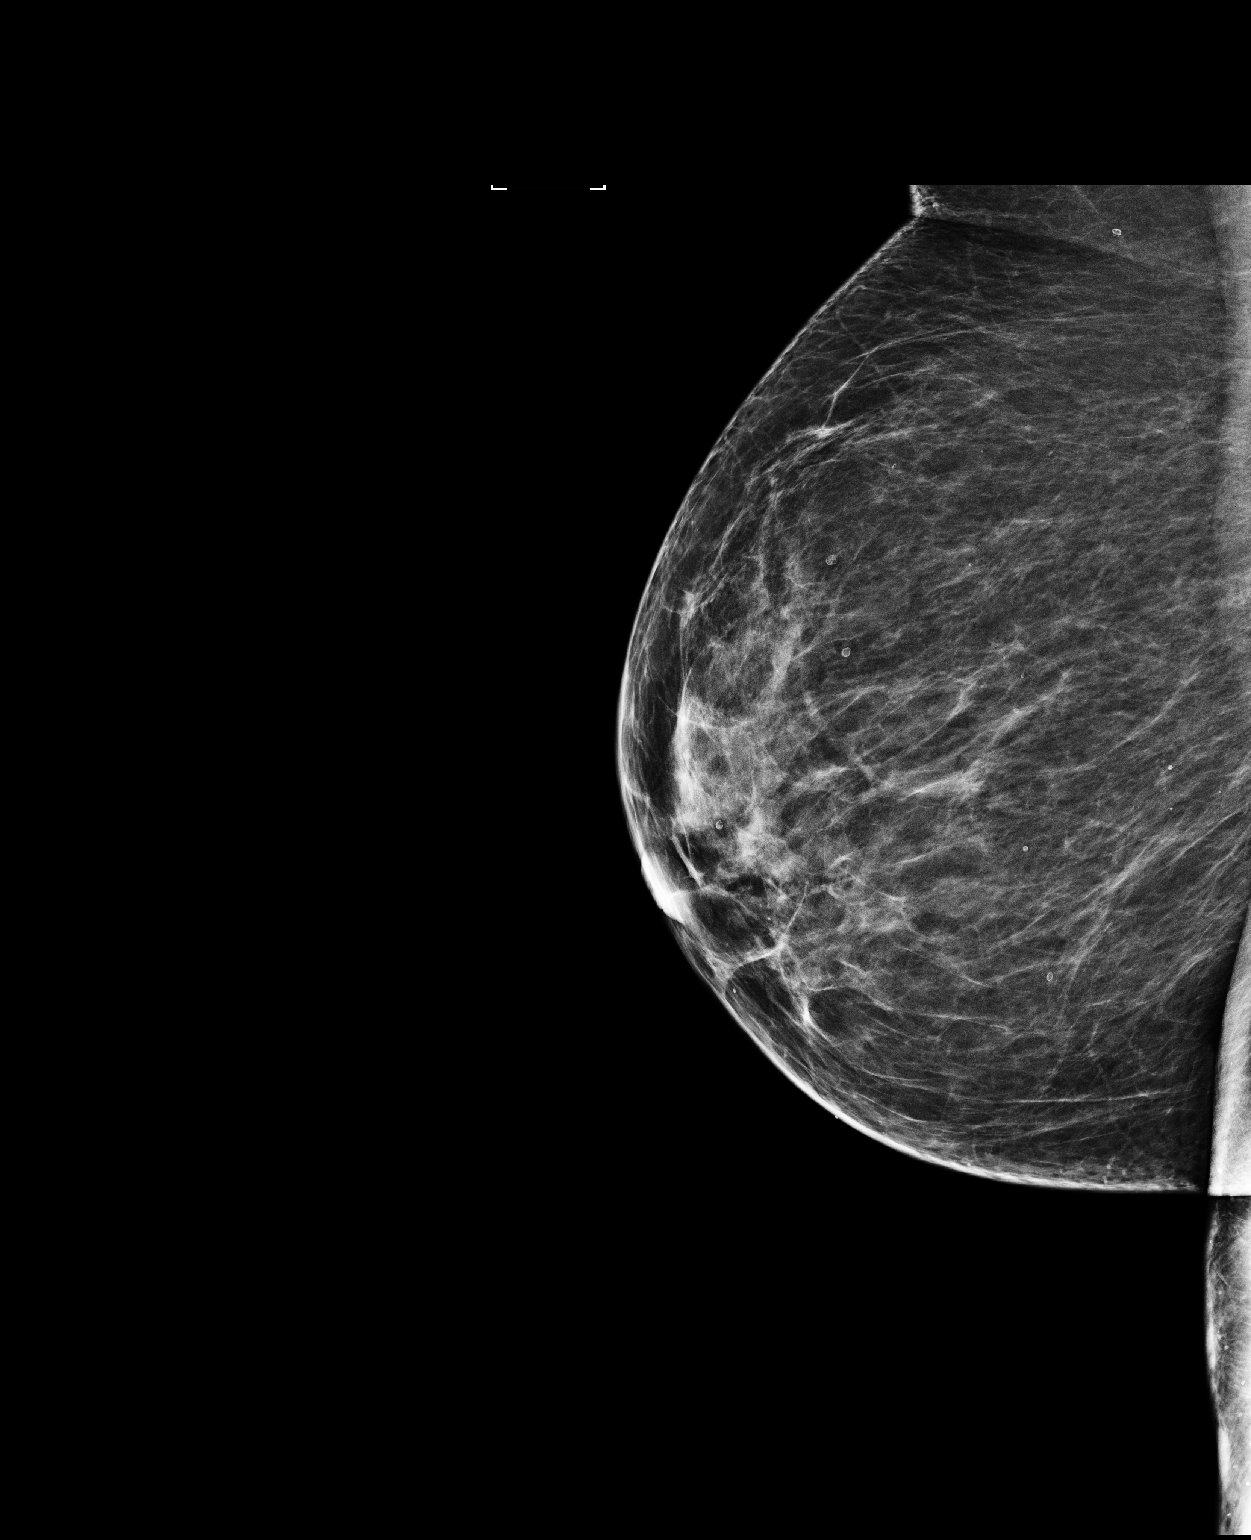

[1 of 1 positions shown; findings below may reference images not displayed]

ACR Breast Density Category b: There are scattered areas of
fibroglandular density.
FINDINGS: There are no findings suspicious for malignancy. Images were
processed with CAD.
IMPRESSION: No mammographic evidence of malignancy. A result letter of this
screening mammogram will be mailed directly to the patient.

RECOMMENDATION:
Screening mammogram in one year. (Code:[TQ])

BI-RADS CATEGORY  1: Negative.

## 2017-08-20 ENCOUNTER — Encounter: Payer: Self-pay | Admitting: Family Medicine

## 2017-08-20 NOTE — Assessment & Plan Note (Signed)
The increased risk of cardiovascular disease associated with this diagnosis, and the need to consistently work on lifestyle to change this is discussed. Following  a  heart healthy diet ,commitment to 30 minutes of exercise at least 5 days per week, as well as control of blood sugar and cholesterol , and achieving a healthy weight are all the areas to be addressed .  

## 2017-08-20 NOTE — Assessment & Plan Note (Signed)
Patient educated about the importance of limiting  Carbohydrate intake , the need to commit to daily physical activity for a minimum of 30 minutes , and to commit weight loss. The fact that changes in all these areas will reduce or eliminate all together the development of diabetes is stressed.  Needs to reduce carbs and sugar  Diabetic Labs Latest Ref Rng & Units 08/08/2017 02/09/2017 07/13/2016 04/27/2016 10/26/2015  HbA1c <5.7 % of total Hgb 5.7(H) - - 5.6 5.9(H)  Chol <200 mg/dL 164 154 155 165 187  HDL >50 mg/dL 48(L) 55 52 52 59  Calc LDL <100 mg/dL - 85 89 100(H) 114  Triglycerides <150 mg/dL 70 69 69 65 71  Creatinine 0.50 - 0.99 mg/dL 0.79 0.74 0.78 0.73 0.81   BP/Weight 08/13/2017 06/18/2017 05/24/2017 04/11/2017 02/13/2017 07/18/2016 63/78/5885  Systolic BP 027 741 287 867 672 094 709  Diastolic BP 80 78 63 76 70 78 60  Wt. (Lbs) 218 - 216 223.04 220 221 217  BMI 35.19 - 34.86 36 35.51 35.67 35.02   No flowsheet data found.

## 2017-08-20 NOTE — Assessment & Plan Note (Signed)

## 2017-08-20 NOTE — Progress Notes (Signed)
Karen [name]     MRN: [id number]      DOB: 10-10-1945  HPI: Patient is in for annual physical exam. C/o increased exertional fatigue , has CV risk factors which are HTN , hyperlipidemia and metabolic syndrome, and has never had cardiology evaluation Recently went to urgent care for a rsh, which responds to xyzal, and is not associated with difficulty breathing  Recent labs,  are reviewed. Immunization is reviewed , and  Is up to date   PE: BP (!) 144/80   Pulse 85   Resp 16   Ht 5\' 6"  (1.676 m)   Wt 218 lb (98.9 kg)   SpO2 97%   BMI 35.19 kg/m   Pleasant  female, alert and oriented x 3, in no cardio-pulmonary distress. Afebrile. HEENT No facial trauma or asymetry. Sinuses non tender.  Extra occullar muscles intact,  External ears normal, Oropharynx moist, no exudate. Neck: supple, no adenopathy,JVD or thyromegaly.No bruits.  Chest: Clear to ascultation bilaterally.No crackles or wheezes. Non tender to palpation  Breast: No asymetry,no masses or lumps. No tenderness. No nipple discharge or inversion. No axillary or supraclavicular adenopathy  Cardiovascular system; Heart sounds normal,  S1 and  S2 ,no S3.  No murmur, or thrill. Apical beat not displaced Peripheral pulses normal. EKG; NSR, rate 76, no ischemia, no LVH  Abdomen: Soft, non tender, no organomegaly or masses. No bruits. Bowel sounds normal. No guarding, tenderness or rebound.  Rectal:  Deferred, cologuard testing to be done.  GU: No examination indicated, hysterectomy and asymptomatic  Musculoskeletal exam: Full ROM of spine, hips , shoulders and knees. No deformity ,swelling or crepitus noted. No muscle wasting or atrophy.   Neurologic: Cranial nerves 2 to 12 intact. Power, tone ,sensation and reflexes normal throughout. No disturbance in gait. No tremor.  Skin: Intact, no ulceration, erythema , scaling or rash noted. Pigmentation normal throughout  Psych; Normal mood and affect.  Judgement and concentration normal   Assessment & Plan:  Essential hypertension, benign Uncontrolled, increase medication dose Nurse bP check in 4 weeks DASH diet and commitment to daily physical activity for a minimum of 30 minutes discussed and encouraged, as a part of hypertension management. The importance of attaining a healthy weight is also discussed.  BP/Weight 08/13/2017 06/18/2017 05/24/2017 04/11/2017 02/13/2017 07/18/2016 22/97/9892  Systolic BP 119 417 408 144 818 563 149  Diastolic BP 80 78 63 76 70 78 60  Wt. (Lbs) 218 - 216 223.04 220 221 217  BMI 35.19 - 34.86 36 35.51 35.67 35.02       Fatigue 4 month h/o increased exertional fatigue and multiple cV risk factors  Office EKG and refer to cardiology EKG shows NSR , rate 76, no LVH, no ischemia  Varicose veins of both lower extremities Knee high compression hose of 15 to 20 mm hG   Annual physical exam Annual exam as documented. Counseling done  re healthy lifestyle involving commitment to 150 minutes exercise per week, heart healthy diet, and attaining healthy weight.The importance of adequate sleep also discussed. Regular seat belt use and home safety, is also discussed. Changes in health habits are decided on by the patient with goals and time frames  set for achieving them. Immunization and cancer screening needs are specifically addressed at this visit.   IGT (impaired glucose tolerance) Patient educated about the importance of limiting  Carbohydrate intake , the need to commit to daily physical activity for a minimum of 30 minutes , and to  commit weight loss. The fact that changes in all these areas will reduce or eliminate all together the development of diabetes is stressed.  Needs to reduce carbs and sugar  Diabetic Labs Latest Ref Rng & Units 08/08/2017 02/09/2017 07/13/2016 04/27/2016 10/26/2015  HbA1c <5.7 % of total Hgb 5.7(H) - - 5.6 5.9(H)  Chol <200 mg/dL 164 154 155 165 187  HDL >50 mg/dL 48(L) 55 52 52  59  Calc LDL <100 mg/dL - 85 89 100(H) 114  Triglycerides <150 mg/dL 70 69 69 65 71  Creatinine 0.50 - 0.99 mg/dL 0.79 0.74 0.78 0.73 0.81   BP/Weight 08/13/2017 06/18/2017 05/24/2017 04/11/2017 02/13/2017 07/18/2016 01/00/7121  Systolic BP 975 883 254 982 641 583 094  Diastolic BP 80 78 63 76 70 78 60  Wt. (Lbs) 218 - 216 223.04 220 221 217  BMI 35.19 - 34.86 36 35.51 35.67 35.02   No flowsheet data found.    Metabolic syndrome X The increased risk of cardiovascular disease associated with this diagnosis, and the need to consistently work on lifestyle to change this is discussed. Following  a  heart healthy diet ,commitment to 30 minutes of exercise at least 5 days per week, as well as control of blood sugar and cholesterol , and achieving a healthy weight are all the areas to be addressed .

## 2017-09-10 ENCOUNTER — Ambulatory Visit: Payer: Medicare HMO

## 2017-09-10 VITALS — BP 132/74

## 2017-09-10 DIAGNOSIS — I10 Essential (primary) hypertension: Secondary | ICD-10-CM

## 2017-09-10 NOTE — Progress Notes (Signed)
138/76 left arm, sitting 132/74 left arm sitting 5 minutes later

## 2017-09-11 ENCOUNTER — Other Ambulatory Visit: Payer: Self-pay | Admitting: Family Medicine

## 2017-09-11 ENCOUNTER — Ambulatory Visit: Payer: Medicare HMO | Admitting: Cardiovascular Disease

## 2017-09-11 MED ORDER — GABAPENTIN 100 MG PO CAPS: 100.0000 mg | ORAL_CAPSULE | Freq: Every day | ORAL | 3 refills | Status: DC

## 2017-09-11 NOTE — Progress Notes (Signed)
Gabapentin

## 2017-09-27 ENCOUNTER — Other Ambulatory Visit: Payer: Self-pay | Admitting: Family Medicine

## 2017-10-04 ENCOUNTER — Ambulatory Visit: Payer: Medicare HMO | Admitting: Cardiology

## 2017-11-06 ENCOUNTER — Ambulatory Visit: Payer: Medicare HMO | Admitting: Cardiovascular Disease

## 2017-12-14 ENCOUNTER — Ambulatory Visit: Payer: Medicare HMO | Admitting: Cardiovascular Disease

## 2018-01-07 ENCOUNTER — Telehealth: Payer: Self-pay | Admitting: Family Medicine

## 2018-01-07 ENCOUNTER — Other Ambulatory Visit: Payer: Self-pay | Admitting: Family Medicine

## 2018-01-07 DIAGNOSIS — M6530 Trigger finger, unspecified finger: Secondary | ICD-10-CM

## 2018-01-07 NOTE — Telephone Encounter (Signed)
pls refer to ortho I will sign

## 2018-01-07 NOTE — Telephone Encounter (Signed)
Patient called in to request referral for local physician to treat her trigger finger. Cb#: [phone]

## 2018-01-07 NOTE — Telephone Encounter (Signed)
Referral entered  

## 2018-01-10 ENCOUNTER — Ambulatory Visit: Payer: Medicare HMO | Admitting: Cardiovascular Disease

## 2018-01-10 ENCOUNTER — Encounter: Payer: Self-pay | Admitting: Cardiovascular Disease

## 2018-01-10 VITALS — BP 126/70 | HR 80 | Ht 66.5 in | Wt 216.4 lb

## 2018-01-10 DIAGNOSIS — Z9189 Other specified personal risk factors, not elsewhere classified: Secondary | ICD-10-CM

## 2018-01-10 DIAGNOSIS — E7800 Pure hypercholesterolemia, unspecified: Secondary | ICD-10-CM

## 2018-01-10 DIAGNOSIS — I10 Essential (primary) hypertension: Secondary | ICD-10-CM

## 2018-01-10 NOTE — Progress Notes (Signed)
CARDIOLOGY CONSULT NOTE  Patient ID: Karen [name] MRN: [id number] DOB/AGE: [birth year] 72 y.o.  Admit date: (Not on file) Primary Physician: Fayrene Helper, MD Referring Physician: Dr. Moshe Cipro  Reason for Consultation: Exertional fatigue  HPI: Karen [name] is a 72 y.o. female who is being seen today for the evaluation of exertional fatigue at the request of Fayrene Helper, MD.   Past medical history includes hypertension, obesity, and hyperlipidemia.  I personally reviewed the ECG performed on 08/13/2017 which demonstrated normal sinus rhythm with no ischemic ST segment or T wave abnormalities, nor any arrhythmias.  I reviewed the lipid panel dated 08/08/2017: Total cholesterol 164, HDL 48, triglycerides 70, LDL 100.  Hemoglobin A1c was 5.7%.  CBC, TSH, and complete metabolic panel were all unremarkable.  The patient denies any symptoms of chest pain, palpitations, shortness of breath, lightheadedness, dizziness, leg swelling, orthopnea, PND, and syncope.  She has been walking more and plans to join the [facility]  She has no exertional symptoms.  She sustained a right ankle fracture in the early 80s and has some occasional pain and swelling in that joint.  She also has right knee arthritis.  She has been walking more and has lost a little weight and has less pain in her right knee.    Allergies  Allergen Reactions  . Amlodipine Rash  . Other     NO BLOOD PRODUCTS  . Penicillins Rash    Has patient had a PCN reaction causing immediate rash, facial/tongue/throat swelling, SOB or lightheadedness with hypotension: Yes Has patient had a PCN reaction causing severe rash involving mucus membranes or skin necrosis: No Has patient had a PCN reaction that required hospitalization: Unknown Has patient had a PCN reaction occurring within the last 10 years: No If all of the above answers are "NO", then may proceed with Cephalosporin use.     Current Outpatient  Medications  Medication Sig Dispense Refill  . aspirin 81 MG tablet Take 81 mg by mouth daily.    . Cholecalciferol (D3-1000) 1000 UNITS tablet Take 2,000 Units by mouth daily.     . Coenzyme Q10 (CO Q 10 PO) Take 1 tablet by mouth daily.     Marland Kitchen levocetirizine (XYZAL) 5 MG tablet Take 5 mg by mouth every evening.    Marland Kitchen losartan-hydrochlorothiazide (HYZAAR) 100-25 MG tablet Take 1 tablet by mouth daily. 90 tablet 3  . simvastatin (ZOCOR) 10 MG tablet TAKE 1 TABLET BY MOUTH AT BEDTIME *STOP  ZETIA* 90 tablet 1  . UNABLE TO FIND Bilateral compression hose x 1 pair 15-20 mm/hg 1 each 0   No current facility-administered medications for this visit.     Past Medical History:  Diagnosis Date  . Arthritis   . C. difficile colitis Jul 18, 2013   Vancomycin. Recheck Cdiff positive. Vancomycin continued for an additional 2 weeks  . H/O herpes zoster   . Hyperlipidemia   . Hypertension   . Metabolic syndrome X   . Prediabetes 2014   lifestyle   . Prediabetes     Past Surgical History:  Procedure Laterality Date  . ABDOMINAL HYSTERECTOMY  1983 approx   fibroids, partial  . ANKLE SURGERY    . COLONOSCOPY  04/27/2005   JQB:HALPFXTKWI polyp at the splenic flexure cold biopsied/removed. Mid  descending polyp removed with cold snare technique. The remainder of the of the colon looked normal/ Normal rectum. inflammed adenomatous polyps.  . COLONOSCOPY N/A 05/26/2013  Dr. Rourk:Multiple colonic polyps removed, tubular adenomas. Needs surveillance Dec 2017.   Marland Kitchen COLONOSCOPY N/A 05/24/2017   Procedure: COLONOSCOPY;  Surgeon: Daneil Dolin, MD;  Location: [facility];  Service: Endoscopy;  Laterality: N/A;  1:00 pm  . FRACTURE SURGERY Right mid 1990's   pin put in and waas removed    Social History   Socioeconomic History  . Marital status: Married    Spouse name: Not on file  . Number of children: Not on file  . Years of education: 73  . Highest education level: Not on file  Occupational  History  . Not on file  Social Needs  . Financial resource strain: Not on file  . Food insecurity:    Worry: Not on file    Inability: Not on file  . Transportation needs:    Medical: Not on file    Non-medical: Not on file  Tobacco Use  . Smoking status: Never Smoker  . Smokeless tobacco: Never Used  Substance and Sexual Activity  . Alcohol use: No  . Drug use: No  . Sexual activity: Not Currently    Birth control/protection: Surgical  Lifestyle  . Physical activity:    Days per week: Not on file    Minutes per session: Not on file  . Stress: Not on file  Relationships  . Social connections:    Talks on phone: Not on file    Gets together: Not on file    Attends religious service: Not on file    Active member of club or organization: Not on file    Attends meetings of clubs or organizations: Not on file    Relationship status: Not on file  . Intimate partner violence:    Fear of current or ex partner: Not on file    Emotionally abused: Not on file    Physically abused: Not on file    Forced sexual activity: Not on file  Other Topics Concern  . Not on file  Social History Narrative  . Not on file     No family history of premature CAD in 1st degree relatives.  Current Meds  Medication Sig  . aspirin 81 MG tablet Take 81 mg by mouth daily.  . Cholecalciferol (D3-1000) 1000 UNITS tablet Take 2,000 Units by mouth daily.   . Coenzyme Q10 (CO Q 10 PO) Take 1 tablet by mouth daily.   Marland Kitchen levocetirizine (XYZAL) 5 MG tablet Take 5 mg by mouth every evening.  Marland Kitchen losartan-hydrochlorothiazide (HYZAAR) 100-25 MG tablet Take 1 tablet by mouth daily.  . simvastatin (ZOCOR) 10 MG tablet TAKE 1 TABLET BY MOUTH AT BEDTIME *STOP  ZETIA*  . UNABLE TO FIND Bilateral compression hose x 1 pair 15-20 mm/hg      Review of systems complete and found to be negative unless listed above in HPI    Physical exam Blood pressure 126/70, pulse 80, height 5' 6.5" (1.689 m), weight 216 lb 6.4  oz (98.2 kg), SpO2 95 %. General: NAD Neck: No JVD, no thyromegaly or thyroid nodule.  Lungs: Clear to auscultation bilaterally with normal respiratory effort. CV: Nondisplaced PMI. Regular rate and rhythm, normal S1/S2, no S3/S4, no murmur.  No peripheral edema.  No carotid bruit.   Abdomen: Soft, nontender, no distention.  Skin: Intact without lesions or rashes.  Neurologic: Alert and oriented x 3.  Psych: Normal affect. Extremities: No clubbing or cyanosis.  HEENT: Normal.   ECG: Most recent ECG reviewed.   Labs: Lab  Results  Component Value Date/Time   K 4.4 08/08/2017 10:13 AM   BUN 13 08/08/2017 10:13 AM   CREATININE 0.79 08/08/2017 10:13 AM   ALT 34 (H) 08/08/2017 10:13 AM   TSH 1.15 08/08/2017 10:13 AM   HGB 12.9 08/08/2017 10:13 AM     Lipids: Lab Results  Component Value Date/Time   LDLCALC 100 (H) 08/08/2017 10:13 AM   CHOL 164 08/08/2017 10:13 AM   TRIG 70 08/08/2017 10:13 AM   HDL 48 (L) 08/08/2017 10:13 AM        ASSESSMENT AND PLAN:  1.  Cardiovascular risk assessment: While she has cardiovascular risk factors which we discussed at length, she presently denies any exertional symptoms and has no lifestyle limiting issues.  We talked about dietary modification and increased exercise for weight loss.  She plans on joining the [facility] and plans to increase the amount of walking she does.  I told her should she develop any symptoms concerning for cardiac disease to contact me.  At that time we could potentially pursue some noninvasive testing.  2.  Hypertension: Controlled on present therapy.  No changes.    3.  Hyperlipidemia: Lipids reviewed above.  Continue statin therapy.     Disposition: Follow up as needed  Signed: Kate Sable, M.D., F.A.C.C.  01/10/2018, 8:41 AM

## 2018-01-10 NOTE — Patient Instructions (Addendum)
Your physician wants you to follow-up in: as needed with [facility]     Your physician recommends that you continue on your current medications as directed. Please refer to the Current Medication list given to you today.     No labs or tests ordered     Thank you for choosing [facility] !

## 2018-01-23 ENCOUNTER — Ambulatory Visit: Payer: Medicare HMO | Admitting: Orthopaedic Surgery

## 2018-01-23 ENCOUNTER — Encounter: Payer: Self-pay | Admitting: Orthopaedic Surgery

## 2018-01-23 VITALS — BP 141/72 | HR 79 | Ht 66.0 in | Wt 217.0 lb

## 2018-01-23 DIAGNOSIS — M65341 Trigger finger, right ring finger: Secondary | ICD-10-CM

## 2018-01-23 DIAGNOSIS — M6530 Trigger finger, unspecified finger: Secondary | ICD-10-CM

## 2018-01-23 NOTE — Progress Notes (Signed)
Subjective:    Patient ID: Karen [name], female    DOB: 26-Jul-1945, 72 y.o.   MRN: [id number]  HPI She has had triggering of the right ring finger for about six to eight weeks getting worse.  She has locking of the finger in the morning upon wakening and it hurts.  She has no trauma, no redness, no numbness. She has seen Dr. Moshe Cipro who asked that the patient come here.  She has no other finger problem.   Review of Systems  Constitutional: Positive for activity change.  Musculoskeletal: Positive for arthralgias.  All other systems reviewed and are negative.  For Review of Systems, all other systems reviewed and are negative.  Past Medical History:  Diagnosis Date  . Arthritis   . C. difficile colitis Jul 18, 2013   Vancomycin. Recheck Cdiff positive. Vancomycin continued for an additional 2 weeks  . H/O herpes zoster   . Hyperlipidemia   . Hypertension   . Metabolic syndrome X   . Prediabetes 2014   lifestyle   . Prediabetes     Past Surgical History:  Procedure Laterality Date  . ABDOMINAL HYSTERECTOMY  1983 approx   fibroids, partial  . ANKLE SURGERY    . COLONOSCOPY  04/27/2005   BWI:OMBTDHRCBU polyp at the splenic flexure cold biopsied/removed. Mid  descending polyp removed with cold snare technique. The remainder of the of the colon looked normal/ Normal rectum. inflammed adenomatous polyps.  . COLONOSCOPY N/A 05/26/2013   Dr. Rourk:Multiple colonic polyps removed, tubular adenomas. Needs surveillance Dec 2017.   Marland Kitchen COLONOSCOPY N/A 05/24/2017   Procedure: COLONOSCOPY;  Surgeon: Daneil Dolin, MD;  Location: [facility];  Service: Endoscopy;  Laterality: N/A;  1:00 pm  . FRACTURE SURGERY Right mid 1990's   pin put in and waas removed    Current Outpatient Medications on File Prior to Visit  Medication Sig Dispense Refill  . aspirin 81 MG tablet Take 81 mg by mouth daily.    . Cholecalciferol (D3-1000) 1000 UNITS tablet Take 2,000 Units by mouth daily.     .  Coenzyme Q10 (CO Q 10 PO) Take 1 tablet by mouth daily.     Marland Kitchen levocetirizine (XYZAL) 5 MG tablet Take 5 mg by mouth every evening.    Marland Kitchen losartan-hydrochlorothiazide (HYZAAR) 100-25 MG tablet Take 1 tablet by mouth daily. 90 tablet 3  . simvastatin (ZOCOR) 10 MG tablet TAKE 1 TABLET BY MOUTH AT BEDTIME *STOP  ZETIA* 90 tablet 1  . UNABLE TO FIND Bilateral compression hose x 1 pair 15-20 mm/hg 1 each 0   No current facility-administered medications on file prior to visit.     Social History   Socioeconomic History  . Marital status: Married    Spouse name: Not on file  . Number of children: Not on file  . Years of education: 38  . Highest education level: Not on file  Occupational History  . Not on file  Social Needs  . Financial resource strain: Not on file  . Food insecurity:    Worry: Not on file    Inability: Not on file  . Transportation needs:    Medical: Not on file    Non-medical: Not on file  Tobacco Use  . Smoking status: Never Smoker  . Smokeless tobacco: Never Used  Substance and Sexual Activity  . Alcohol use: No  . Drug use: No  . Sexual activity: Not Currently    Birth control/protection: Surgical  Lifestyle  .  Physical activity:    Days per week: Not on file    Minutes per session: Not on file  . Stress: Not on file  Relationships  . Social connections:    Talks on phone: Not on file    Gets together: Not on file    Attends religious service: Not on file    Active member of club or organization: Not on file    Attends meetings of clubs or organizations: Not on file    Relationship status: Not on file  . Intimate partner violence:    Fear of current or ex partner: Not on file    Emotionally abused: Not on file    Physically abused: Not on file    Forced sexual activity: Not on file  Other Topics Concern  . Not on file  Social History Narrative  . Not on file    Family History  Problem Relation Age of Onset  . Hypertension Mother   . Lung  cancer Mother   . Hypertension Father   . Hypertension Sister   . Hypertension Brother   . Hypertension Sister   . Diabetes Sister   . Hypertension Son   . Hypertension Son   . Colon cancer Neg Hx     BP (!) 141/72   Pulse 79   Ht 5\' 6"  (1.676 m)   Wt 217 lb (98.4 kg)   BMI 35.02 kg/m   Body mass index is 35.02 kg/m.      Objective:   Physical Exam  Constitutional: She is oriented to person, place, and time. She appears well-developed and well-nourished.  HENT:  Head: Normocephalic and atraumatic.  Eyes: Pupils are equal, round, and reactive to light. Conjunctivae and EOM are normal.  Neck: Normal range of motion. Neck supple.  Cardiovascular: Normal rate, regular rhythm and intact distal pulses.  Pulmonary/Chest: Effort normal.  Abdominal: Soft.  Musculoskeletal:       Hands: Neurological: She is alert and oriented to person, place, and time. She has normal reflexes. She displays normal reflexes. No cranial nerve deficit. She exhibits normal muscle tone. Coordination normal.  Skin: Skin is warm and dry.  Psychiatric: She has a normal mood and affect. Her behavior is normal. Judgment and thought content normal.          Assessment & Plan:   Encounter Diagnosis  Name Primary?  . Trigger finger, acquired Yes   Procedure note: After permission from the patient and sterile prep of the A1 pulley area of the volar ring finger right dominant hand, the area was injected with 1 % Xylocaine plain and 1 cc of DepoMedrol 40 by sterile technique tolerated well.  I have told her about the injection and the definitive treatment is surgery.  She elected to have the injection.  I will see her in two weeks.  Call if any problem.  Precautions discussed.   Electronically Signed Sanjuana Kava, MD 8/14/20198:27 AM

## 2018-02-06 ENCOUNTER — Ambulatory Visit: Payer: Medicare HMO | Admitting: Orthopaedic Surgery

## 2018-02-07 ENCOUNTER — Telehealth: Payer: Self-pay | Admitting: Family Medicine

## 2018-02-07 NOTE — Telephone Encounter (Signed)
Please put in Lab orders. She has appt on 9/4 to see you.

## 2018-02-07 NOTE — Telephone Encounter (Signed)
pls order fasting lipid, cmp and EGFr and hBa1C dx IGT,  HTN and hyperlipidemia

## 2018-02-08 ENCOUNTER — Telehealth: Payer: Self-pay

## 2018-02-08 DIAGNOSIS — R7302 Impaired glucose tolerance (oral): Secondary | ICD-10-CM

## 2018-02-08 DIAGNOSIS — E785 Hyperlipidemia, unspecified: Secondary | ICD-10-CM

## 2018-02-08 DIAGNOSIS — I10 Essential (primary) hypertension: Secondary | ICD-10-CM

## 2018-02-08 NOTE — Telephone Encounter (Signed)
Labs ordered per Dr.Simpson. Patient notified of the need to have labs drawn prior to appt with verbal understanding.

## 2018-02-08 NOTE — Telephone Encounter (Signed)
Fasting lipid, cmp +efgr, HBA1C ordered per Dr.Simpson.

## 2018-02-12 DIAGNOSIS — R7302 Impaired glucose tolerance (oral): Secondary | ICD-10-CM | POA: Diagnosis not present

## 2018-02-12 DIAGNOSIS — E785 Hyperlipidemia, unspecified: Secondary | ICD-10-CM | POA: Diagnosis not present

## 2018-02-12 DIAGNOSIS — I10 Essential (primary) hypertension: Secondary | ICD-10-CM | POA: Diagnosis not present

## 2018-02-13 ENCOUNTER — Other Ambulatory Visit: Payer: Self-pay

## 2018-02-13 ENCOUNTER — Ambulatory Visit (INDEPENDENT_AMBULATORY_CARE_PROVIDER_SITE_OTHER): Payer: Medicare HMO | Admitting: Family Medicine

## 2018-02-13 ENCOUNTER — Encounter: Payer: Self-pay | Admitting: Family Medicine

## 2018-02-13 VITALS — BP 152/74 | HR 85 | Resp 12 | Ht 66.5 in | Wt 217.0 lb

## 2018-02-13 DIAGNOSIS — R7302 Impaired glucose tolerance (oral): Secondary | ICD-10-CM | POA: Diagnosis not present

## 2018-02-13 DIAGNOSIS — Z23 Encounter for immunization: Secondary | ICD-10-CM | POA: Insufficient documentation

## 2018-02-13 DIAGNOSIS — E559 Vitamin D deficiency, unspecified: Secondary | ICD-10-CM

## 2018-02-13 DIAGNOSIS — Z86010 Personal history of colonic polyps: Secondary | ICD-10-CM

## 2018-02-13 DIAGNOSIS — E8881 Metabolic syndrome: Secondary | ICD-10-CM | POA: Diagnosis not present

## 2018-02-13 DIAGNOSIS — I10 Essential (primary) hypertension: Secondary | ICD-10-CM

## 2018-02-13 DIAGNOSIS — E785 Hyperlipidemia, unspecified: Secondary | ICD-10-CM | POA: Diagnosis not present

## 2018-02-13 LAB — COMPLETE METABOLIC PANEL WITH GFR
AG Ratio: 1.3 (calc) (ref 1.0–2.5)
ALBUMIN MSPROF: 3.8 g/dL (ref 3.6–5.1)
ALT: 17 U/L (ref 6–29)
AST: 20 U/L (ref 10–35)
Alkaline phosphatase (APISO): 82 U/L (ref 33–130)
BUN: 14 mg/dL (ref 7–25)
CALCIUM: 9.7 mg/dL (ref 8.6–10.4)
CO2: 32 mmol/L (ref 20–32)
CREATININE: 0.85 mg/dL (ref 0.60–0.93)
Chloride: 104 mmol/L (ref 98–110)
GFR, EST AFRICAN AMERICAN: 80 mL/min/{1.73_m2} (ref >=60)
GFR, EST NON AFRICAN AMERICAN: 69 mL/min/{1.73_m2} (ref >=60)
GLOBULIN: 2.9 g/dL (ref 1.9–3.7)
Glucose, Bld: 90 mg/dL (ref 65–99)
Potassium: 4 mmol/L (ref 3.5–5.3)
SODIUM: 140 mmol/L (ref 135–146)
TOTAL PROTEIN: 6.7 g/dL (ref 6.1–8.1)
Total Bilirubin: 0.6 mg/dL (ref 0.2–1.2)

## 2018-02-13 LAB — LIPID PANEL
CHOLESTEROL: 159 mg/dL (ref <200)
HDL: 51 mg/dL (ref >50)
LDL Cholesterol (Calc): 93 mg/dL (calc)
Non-HDL Cholesterol (Calc): 108 mg/dL (calc) (ref <130)
Total CHOL/HDL Ratio: 3.1 (calc) (ref <5.0)
Triglycerides: 63 mg/dL (ref <150)

## 2018-02-13 LAB — HEMOGLOBIN A1C
Hgb A1c MFr Bld: 5.7 % of total Hgb — ABNORMAL HIGH (ref <5.7)
Mean Plasma Glucose: 117 (calc)
eAG (mmol/L): 6.5 (calc)

## 2018-02-13 MED ORDER — TRIAMTERENE-HCTZ 37.5-25 MG PO TABS: 1.0000 | ORAL_TABLET | Freq: Every day | ORAL | 3 refills | Status: DC

## 2018-02-13 NOTE — Progress Notes (Signed)
Karen [name]     MRN: [id number]      DOB: 06/08/1946   HPI Karen [name] is here for follow up and re-evaluation of chronic medical conditions, medication management and review of any available recent lab and radiology data.  Preventive health is updated, specifically  Cancer screening and Immunization.    The PT denies any adverse reactions to current medications since the last visit. She has been out of her current blood pressure med for 2 days and is concerned  That it is not as effective as her previous medication Frustrated with inability to lose weight , and wants to know about alternate day fasting if good and what to eat. I educate and encourage her to adopt this .    ROS Denies recent fever or chills. Denies sinus pressure, nasal congestion, ear pain or sore throat. Denies chest congestion, productive cough or wheezing. Denies chest pains, palpitations and leg swelling Denies abdominal pain, nausea, vomiting,diarrhea or constipation.   Denies dysuria, frequency, hesitancy or incontinence. Denies joint pain, swelling and limitation in mobility. Denies headaches, seizures, numbness, or tingling. Denies depression, anxiety or insomnia. Denies skin break down or rash.   PE  BP (!) 152/74 (BP Location: Left Arm, Patient Position: Sitting, Cuff Size: Normal)   Pulse 85   Resp 12   Ht 5' 6.5" (1.689 m)   Wt 217 lb (98.4 kg)   SpO2 98% Comment: room air  BMI 34.50 kg/m   Patient alert and oriented and in no cardiopulmonary distress.  HEENT: No facial asymmetry, EOMI,   oropharynx pink and moist.  Neck supple no JVD, no mass.  Chest: Clear to auscultation bilaterally.  CVS: S1, S2 no murmurs, no S3.Regular rate.  ABD: Soft non tender.   Ext: No edema  MS: Adequate ROM spine, shoulders, hips and knees.  Skin: Intact, no ulcerations or rash noted.  Psych: Good eye contact, normal affect. Memory intact not anxious or depressed appearing.  CNS: CN 2-12 intact,  power,  normal throughout.no focal deficits noted.   Assessment & Plan  Essential hypertension, benign Uncontrolled, change medication to triamterene, re eval in 6 to 8 weeks at wellness visit DASH diet and commitment to daily physical activity for a minimum of 30 minutes discussed and encouraged, as a part of hypertension management. The importance of attaining a healthy weight is also discussed.  BP/Weight 02/13/2018 01/23/2018 01/10/2018 09/10/2017 08/13/2017 06/18/2017 01/75/1025  Systolic BP 852 778 242 353 614 431 540  Diastolic BP 74 72 70 74 80 78 63  Wt. (Lbs) 217 217 216.4 - 218 - 216  BMI 34.5 35.02 34.4 - 35.19 - 34.86       IGT (impaired glucose tolerance) Unchanged  Patient educated about the importance of limiting  Carbohydrate intake , the need to commit to daily physical activity for a minimum of 30 minutes , and to commit weight loss. The fact that changes in all these areas will reduce or eliminate all together the development of diabetes is stressed.   Diabetic Labs Latest Ref Rng & Units 02/12/2018 08/08/2017 02/09/2017 07/13/2016 04/27/2016  HbA1c <5.7 % of total Hgb 5.7(H) 5.7(H) - - 5.6  Chol <200 mg/dL 159 164 154 155 165  HDL >50 mg/dL 51 48(L) 55 52 52  Calc LDL mg/dL (calc) 93 100(H) 85 89 100(H)  Triglycerides <150 mg/dL 63 70 69 69 65  Creatinine 0.60 - 0.93 mg/dL 0.85 0.79 0.74 0.78 0.73   BP/Weight 02/13/2018 01/23/2018 01/10/2018 09/10/2017  08/13/2017 06/18/2017 23/36/1224  Systolic BP 497 530 051 102 111 735 670  Diastolic BP 74 72 70 74 80 78 63  Wt. (Lbs) 217 217 216.4 - 218 - 216  BMI 34.5 35.02 34.4 - 35.19 - 34.86   No flowsheet data found.  Updated lab needed at/ before next visit.   Morbid obesity ([place]) Deteriorated. Patient re-educated about  the importance of commitment to a  minimum of 150 minutes of exercise per week.  The importance of healthy food choices with portion control discussed. Encouraged to start a food diary, count calories and to consider   joining a support group. Sample diet sheets offered. Goals set by the patient for the next several months.   Weight /BMI 02/13/2018 01/23/2018 01/10/2018  WEIGHT 217 lb 217 lb 216 lb 6.4 oz  HEIGHT 5' 6.5" 5\' 6"  5' 6.5"  BMI 34.5 kg/m2 35.02 kg/m2 34.4 kg/m2      Hyperlipidemia LDL goal <100 Improved Hyperlipidemia:Low fat diet discussed and encouraged.   Lipid Panel  Lab Results  Component Value Date   CHOL 159 02/12/2018   HDL 51 02/12/2018   LDLCALC 93 02/12/2018   TRIG 63 02/12/2018   CHOLHDL 3.1 02/12/2018   No medication change Updated lab needed at/ before next visit.     Hx of adenomatous colonic polyps Return 3 stool cards in December, currently denies abdominal pain, change in BM and has a good appetite

## 2018-02-13 NOTE — Patient Instructions (Addendum)
Wellness with nurse in 6  Weeks, nurse to notify MD of blood pressure at the visit  Annual; physical exam with MD 08/13/2017 or shortly after  Labs fasting CBC, lipid, cmp and eGFER, tSH vitamin D and HBA1C 3rd week in Feb, collect order at checkout please  Collect 3 stool cards today top submit 3rd week in December    Change blood pressure medication to maxzide ione daily effective today  It is important that you exercise regularly at least 30 minutes 5 times a week. If you develop chest pain, have severe difficulty breathing, or feel very tired, stop exercising immediately and seek medical attention    Please work on good  health habits so that your health will improve. 1. Commitment to daily physical activity for 30 to 60  minutes, if you are able to do this.  2. Commitment to wise food choices. Aim for half of your  food intake to be vegetable and fruit, one quarter starchy foods, and one quarter protein. Try to eat on a regular schedule  3 meals per day, snacking between meals should be limited to vegetables or fruits or small portions of nuts. 64 ounces of water per day is generally recommended, unless you have specific health conditions, like heart failure or kidney failure where you will need to limit fluid intake.  3. Commitment to sufficient and a  good quality of physical and mental rest daily, generally between 6 to 8 hours per day.  WITH PERSISTANCE AND PERSEVERANCE, THE IMPOSSIBLE , BECOMES THE NORM!

## 2018-02-13 NOTE — Assessment & Plan Note (Signed)
Deteriorated. Patient re-educated about  the importance of commitment to a  minimum of 150 minutes of exercise per week.  The importance of healthy food choices with portion control discussed. Encouraged to start a food diary, count calories and to consider  joining a support group. Sample diet sheets offered. Goals set by the patient for the next several months.   Weight /BMI 02/13/2018 01/23/2018 01/10/2018  WEIGHT 217 lb 217 lb 216 lb 6.4 oz  HEIGHT 5' 6.5" 5\' 6"  5' 6.5"  BMI 34.5 kg/m2 35.02 kg/m2 34.4 kg/m2

## 2018-02-13 NOTE — Assessment & Plan Note (Signed)
Uncontrolled, change medication to triamterene, re eval in 6 to 8 weeks at wellness visit DASH diet and commitment to daily physical activity for a minimum of 30 minutes discussed and encouraged, as a part of hypertension management. The importance of attaining a healthy weight is also discussed.  BP/Weight 02/13/2018 01/23/2018 01/10/2018 09/10/2017 08/13/2017 06/18/2017 64/40/3474  Systolic BP 259 563 875 643 329 518 841  Diastolic BP 74 72 70 74 80 78 63  Wt. (Lbs) 217 217 216.4 - 218 - 216  BMI 34.5 35.02 34.4 - 35.19 - 34.86

## 2018-02-13 NOTE — Assessment & Plan Note (Signed)
Unchanged  Patient educated about the importance of limiting  Carbohydrate intake , the need to commit to daily physical activity for a minimum of 30 minutes , and to commit weight loss. The fact that changes in all these areas will reduce or eliminate all together the development of diabetes is stressed.   Diabetic Labs Latest Ref Rng & Units 02/12/2018 08/08/2017 02/09/2017 07/13/2016 04/27/2016  HbA1c <5.7 % of total Hgb 5.7(H) 5.7(H) - - 5.6  Chol <200 mg/dL 159 164 154 155 165  HDL >50 mg/dL 51 48(L) 55 52 52  Calc LDL mg/dL (calc) 93 100(H) 85 89 100(H)  Triglycerides <150 mg/dL 63 70 69 69 65  Creatinine 0.60 - 0.93 mg/dL 0.85 0.79 0.74 0.78 0.73   BP/Weight 02/13/2018 01/23/2018 01/10/2018 09/10/2017 08/13/2017 06/18/2017 16/03/9603  Systolic BP 540 981 191 478 295 621 308  Diastolic BP 74 72 70 74 80 78 63  Wt. (Lbs) 217 217 216.4 - 218 - 216  BMI 34.5 35.02 34.4 - 35.19 - 34.86   No flowsheet data found.  Updated lab needed at/ before next visit.

## 2018-02-13 NOTE — Assessment & Plan Note (Signed)
Improved Hyperlipidemia:Low fat diet discussed and encouraged.   Lipid Panel  Lab Results  Component Value Date   CHOL 159 02/12/2018   HDL 51 02/12/2018   LDLCALC 93 02/12/2018   TRIG 63 02/12/2018   CHOLHDL 3.1 02/12/2018   No medication change Updated lab needed at/ before next visit.

## 2018-02-13 NOTE — Assessment & Plan Note (Signed)
After obtaining informed consent, the vaccine is  administered by LPN.  

## 2018-02-13 NOTE — Assessment & Plan Note (Signed)
Return 3 stool cards in December, currently denies abdominal pain, change in BM and has a good appetite

## 2018-02-14 DIAGNOSIS — Z23 Encounter for immunization: Secondary | ICD-10-CM | POA: Diagnosis not present

## 2018-02-14 DIAGNOSIS — E8881 Metabolic syndrome: Secondary | ICD-10-CM | POA: Diagnosis not present

## 2018-02-14 DIAGNOSIS — R7302 Impaired glucose tolerance (oral): Secondary | ICD-10-CM | POA: Diagnosis not present

## 2018-02-14 DIAGNOSIS — E559 Vitamin D deficiency, unspecified: Secondary | ICD-10-CM | POA: Diagnosis not present

## 2018-02-14 DIAGNOSIS — E785 Hyperlipidemia, unspecified: Secondary | ICD-10-CM | POA: Diagnosis not present

## 2018-02-14 DIAGNOSIS — I10 Essential (primary) hypertension: Secondary | ICD-10-CM | POA: Diagnosis not present

## 2018-02-14 DIAGNOSIS — Z86010 Personal history of colonic polyps: Secondary | ICD-10-CM | POA: Diagnosis not present

## 2018-02-21 ENCOUNTER — Telehealth: Payer: Self-pay | Admitting: Family Medicine

## 2018-02-21 NOTE — Telephone Encounter (Signed)
Pt is calling stating that Dr Moshe Cipro changed her blood pressure med on 9-4, and she has been monitoring the BP, and it is still not low enough for her, 151/82..162/84..178/87.  Please call her

## 2018-02-21 NOTE — Telephone Encounter (Signed)
How long should she wait to be seeing results? Anything different you want to do?

## 2018-02-22 ENCOUNTER — Telehealth: Payer: Self-pay | Admitting: Family Medicine

## 2018-02-22 ENCOUNTER — Other Ambulatory Visit: Payer: Self-pay | Admitting: Family Medicine

## 2018-02-22 MED ORDER — TRIAMTERENE-HCTZ 75-50 MG PO TABS: 1.0000 | ORAL_TABLET | Freq: Every day | ORAL | 3 refills | Status: DC

## 2018-02-22 NOTE — Telephone Encounter (Signed)
Pls advise her to take TWO maxzide tablets daily and c ensure she has an appointment to see me in first or second week in October for blood pressure evaluation

## 2018-02-22 NOTE — Telephone Encounter (Signed)
Patient had called in yesterday and then again today concerned. She is supposed to go out of town this weekend but is scared too since her BP keeps climbing. States she had Angie check it manually and the readings she is reporting are correct. Wants a call back this am.

## 2018-02-22 NOTE — Telephone Encounter (Signed)
Modification on her follow up is that she come to see me next week for a blood pressure evaluation  ( hopefully she is in town Also I sent in the new dose of maxzide which equal to the two tabs of the current dose , she needs to make sure she collects the right dose when she goes to the pharmacy and takes only one

## 2018-02-22 NOTE — Telephone Encounter (Signed)
Patient called in this morning because she is worried about her BP readings 178/87 179/89 179/87  170/81   CB#: [phone]/ [phone]

## 2018-02-22 NOTE — Telephone Encounter (Signed)
This is for nurse

## 2018-02-22 NOTE — Telephone Encounter (Signed)
Patient aware and scheduled for thursday

## 2018-02-22 NOTE — Telephone Encounter (Signed)
Response is sent today, Increase the maxzide to two daily, and have MD appointment for blood pressure evaluation first or second week in October

## 2018-02-28 ENCOUNTER — Ambulatory Visit (INDEPENDENT_AMBULATORY_CARE_PROVIDER_SITE_OTHER): Payer: Medicare HMO | Admitting: Family Medicine

## 2018-02-28 ENCOUNTER — Encounter: Payer: Self-pay | Admitting: Family Medicine

## 2018-02-28 VITALS — BP 140/84 | HR 90 | Resp 15 | Ht 66.5 in | Wt 213.0 lb

## 2018-02-28 DIAGNOSIS — I10 Essential (primary) hypertension: Secondary | ICD-10-CM

## 2018-02-28 MED ORDER — LISINOPRIL-HYDROCHLOROTHIAZIDE 20-25 MG PO TABS: 1.0000 | ORAL_TABLET | Freq: Every day | ORAL | 3 refills | Status: DC

## 2018-02-28 NOTE — Patient Instructions (Addendum)
Keep appointments as before, call if you need me before  Change your blood pressure medication to the one you have been taking since this is controlling your blood pressure and you can tolerate this  Watch for any bad s/e like dry cough and tickle   Non fasting chem 7 and EGFR   At the wellness visit  Thanks for choosing [facility], we consider it a privelige to serve you.

## 2018-03-01 ENCOUNTER — Encounter: Payer: Self-pay | Admitting: Family Medicine

## 2018-03-01 NOTE — Progress Notes (Signed)
   Karen [name]     MRN: [id number]      DOB: [birth year]   HPI Karen [name] is here for follow up and re-evaluation of hypertension and medication management of the problem Recently started on maxzide 25 mg, called in stating this was ineffective and BP was actually rising. Did not tolerate the higher dose of 1.5 tab, and reverted to her old medication of and ACE/hCTZ combination.there had been some concern  Re ACE cough/ possible allergy, but she Is not concerned / convinced that this is indeed a problem now, just wants her bP controled, got SBP in the 170's ROS Denies recent fever or chills. Denies sinus pressure, nasal congestion, ear pain or sore throat. Denies chest congestion, productive cough or wheezing.  Denies joint pain, swelling and limitation in mobility. Denies headaches, seizures, numbness, or tingling. Denies depression, anxiety or insomnia. Denies skin break down or rash.   PE  BP 140/84   Pulse 90   Resp 15   Ht 5' 6.5" (1.689 m)   Wt 213 lb (96.6 kg)   SpO2 99%   BMI 33.86 kg/m   Patient alert and oriented and in no cardiopulmonary distress.  HEENT: No facial asymmetry, EOMI,   oropharynx pink and moist.  Neck supple no JVD, no mass.  Chest: Clear to auscultation bilaterally.  CVS: S1, S2 no murmurs, no S3.Regular rate.   Ext: No edema   Assessment & Plan  Essential hypertension, benign Intolerant of maxzide and reports that it was ineffective DASH diet and commitment to daily physical activity for a minimum of 30 minutes discussed and encouraged, as a part of hypertension management. The importance of attaining a healthy weight is also discussed.  BP/Weight 02/28/2018 02/13/2018 01/23/2018 01/10/2018 09/10/2017 01/17/3253 02/18/2640  Systolic BP 583 094 076 808 811 031 594  Diastolic BP 84 74 72 70 74 80 78  Wt. (Lbs) 213 217 217 216.4 - 218 -  BMI 33.86 34.5 35.02 34.4 - 35.19 -   Sub optimal bP control. No med change Focus on weight loss and exercise  for further lowering Pt on alert for unwanted s/e of cough or tickle in throat    Morbid obesity ([place]) Improved Patient re-educated about  the importance of commitment to a  minimum of 150 minutes of exercise per week.  The importance of healthy food choices with portion control discussed. Encouraged to start a food diary, count calories and to consider  joining a support group. Sample diet sheets offered. Goals set by the patient for the next several months.   Weight /BMI 02/28/2018 02/13/2018 01/23/2018  WEIGHT 213 lb 217 lb 217 lb  HEIGHT 5' 6.5" 5' 6.5" 5\' 6"   BMI 33.86 kg/m2 34.5 kg/m2 35.02 kg/m2

## 2018-03-01 NOTE — Assessment & Plan Note (Signed)
Intolerant of maxzide and reports that it was ineffective DASH diet and commitment to daily physical activity for a minimum of 30 minutes discussed and encouraged, as a part of hypertension management. The importance of attaining a healthy weight is also discussed.  BP/Weight 02/28/2018 02/13/2018 01/23/2018 01/10/2018 09/10/2017 09/18/7528 0/10/1100  Systolic BP 111 735 670 141 030 131 438  Diastolic BP 84 74 72 70 74 80 78  Wt. (Lbs) 213 217 217 216.4 - 218 -  BMI 33.86 34.5 35.02 34.4 - 35.19 -   Sub optimal bP control. No med change Focus on weight loss and exercise for further lowering Pt on alert for unwanted s/e of cough or tickle in throat

## 2018-03-01 NOTE — Assessment & Plan Note (Signed)
Improved Patient re-educated about  the importance of commitment to a  minimum of 150 minutes of exercise per week.  The importance of healthy food choices with portion control discussed. Encouraged to start a food diary, count calories and to consider  joining a support group. Sample diet sheets offered. Goals set by the patient for the next several months.   Weight /BMI 02/28/2018 02/13/2018 01/23/2018  WEIGHT 213 lb 217 lb 217 lb  HEIGHT 5' 6.5" 5' 6.5" 5\' 6"   BMI 33.86 kg/m2 34.5 kg/m2 35.02 kg/m2

## 2018-04-15 ENCOUNTER — Ambulatory Visit: Payer: Medicare HMO

## 2018-04-16 ENCOUNTER — Other Ambulatory Visit: Payer: Self-pay | Admitting: Family Medicine

## 2018-04-17 ENCOUNTER — Ambulatory Visit: Payer: Medicare HMO

## 2018-04-22 ENCOUNTER — Ambulatory Visit (INDEPENDENT_AMBULATORY_CARE_PROVIDER_SITE_OTHER): Payer: Medicare HMO

## 2018-04-22 VITALS — BP 118/68 | HR 79 | Resp 12 | Ht 66.0 in | Wt 212.0 lb

## 2018-04-22 DIAGNOSIS — Z0000 Encounter for general adult medical examination without abnormal findings: Secondary | ICD-10-CM

## 2018-04-22 NOTE — Progress Notes (Signed)
Subjective:   Karen [name] is a 72 y.o. female who presents for Medicare Annual (Subsequent) preventive examination.  Review of Systems:   Cardiac Risk Factors include: hypertension;advanced age (>50men, >76 women);dyslipidemia;obesity (BMI >30kg/m2)     Objective:     Vitals: BP 118/68   Pulse 79   Resp 12   Ht 5\' 6"  (1.676 m)   Wt 212 lb (96.2 kg)   SpO2 96%   BMI 34.22 kg/m   Body mass index is 34.22 kg/m.  Advanced Directives 04/22/2018 05/24/2017 04/11/2017 07/18/2013 05/26/2013  Does Patient Have a Medical Advance Directive? No No No Patient does not have advance directive Patient does not have advance directive;Patient would not like information  Would patient like information on creating a medical advance directive? Yes (ED - Information included in AVS) No - Patient declined Yes (MAU/Ambulatory/Procedural Areas - Information given) - -  Pre-existing out of facility DNR order (yellow form or pink MOST form) - - - No No    Tobacco Social History   Tobacco Use  Smoking Status Never Smoker  Smokeless Tobacco Never Used     Counseling given: Not Answered   Clinical Intake:  Pre-visit preparation completed: Yes  Pain : No/denies pain Pain Score: 0-No pain     BMI - recorded: 34.2 Nutritional Status: BMI > 30  Obese Nutritional Risks: None Diabetes: No  How often do you need to have someone help you when you read instructions, pamphlets, or other written materials from your doctor or pharmacy?: 1 - Never What is the last grade level you completed in school?: 12 grade  Interpreter Needed?: No  Information entered by :: Francena Hanly LPN   Past Medical History:  Diagnosis Date  . Arthritis   . C. difficile colitis Jul 18, 2013   Vancomycin. Recheck Cdiff positive. Vancomycin continued for an additional 2 weeks  . H/O herpes zoster   . Hyperlipidemia   . Hypertension   . Metabolic syndrome X   . Prediabetes 2014   lifestyle   . Prediabetes     Past Surgical History:  Procedure Laterality Date  . ABDOMINAL HYSTERECTOMY  1983 approx   fibroids, partial  . ANKLE SURGERY    . COLONOSCOPY  04/27/2005   YPP:[id number] polyp at the splenic flexure cold biopsied/removed. Mid  descending polyp removed with cold snare technique. The remainder of the of the colon looked normal/ Normal rectum. inflammed adenomatous polyps.  . COLONOSCOPY N/A 05/26/2013   Dr. Rourk:Multiple colonic polyps removed, tubular adenomas. Needs surveillance Dec 2017.   Marland Kitchen COLONOSCOPY N/A 05/24/2017   Procedure: COLONOSCOPY;  Surgeon: Daneil Dolin, MD;  Location: [facility];  Service: Endoscopy;  Laterality: N/A;  1:00 pm  . FRACTURE SURGERY Right mid 1990's   pin put in and waas removed   Family History  Problem Relation Age of Onset  . Hypertension Mother   . Lung cancer Mother   . Hypertension Father   . Hypertension Sister   . Hypertension Brother   . Hypertension Sister   . Diabetes Sister   . Hypertension Son   . Hypertension Son   . Colon cancer Neg Hx    Social History   Socioeconomic History  . Marital status: Married    Spouse name: Not on file  . Number of children: 3  . Years of education: 29  . Highest education level: 12th grade  Occupational History  . Occupation: fulltime - caregiver   Social Needs  .  Financial resource strain: Not hard at all  . Food insecurity:    Worry: Never true    Inability: Never true  . Transportation needs:    Medical: No    Non-medical: No  Tobacco Use  . Smoking status: Never Smoker  . Smokeless tobacco: Never Used  Substance and Sexual Activity  . Alcohol use: No  . Drug use: No  . Sexual activity: Not Currently    Birth control/protection: Surgical  Lifestyle  . Physical activity:    Days per week: 2 days    Minutes per session: 30 min  . Stress: Only a little  Relationships  . Social connections:    Talks on phone: More than three times a week    Gets together: Three times a  week    Attends religious service: 1 to 4 times per year    Active member of club or organization: No    Attends meetings of clubs or organizations: Never    Relationship status: Married  Other Topics Concern  . Not on file  Social History Narrative   Lives with husband alone, marriage is strained     Outpatient Encounter Medications as of 04/22/2018  Medication Sig  . aspirin 81 MG tablet Take 81 mg by mouth daily.  . Cholecalciferol (D3-1000) 1000 UNITS tablet Take 2,000 Units by mouth daily.   . Coenzyme Q10 (CO Q 10 PO) Take 1 tablet by mouth daily.   Marland Kitchen levocetirizine (XYZAL) 5 MG tablet Take 5 mg by mouth every evening.  Marland Kitchen lisinopril-hydrochlorothiazide (PRINZIDE,ZESTORETIC) 20-25 MG tablet Take 1 tablet by mouth daily.  . simvastatin (ZOCOR) 10 MG tablet TAKE 1 TABLET BY MOUTH AT BEDTIME (STOP  ZETIA)  . UNABLE TO FIND Bilateral compression hose x 1 pair 15-20 mm/hg   No facility-administered encounter medications on file as of 04/22/2018.     Activities of Daily Living In your present state of health, do you have any difficulty performing the following activities: 04/22/2018  Hearing? N  Vision? N  Difficulty concentrating or making decisions? N  Walking or climbing stairs? N  Dressing or bathing? N  Doing errands, shopping? N  Preparing Food and eating ? N  Using the Toilet? N  In the past six months, have you accidently leaked urine? N  Do you have problems with loss of bowel control? N  Managing your Medications? N  Managing your Finances? N  Housekeeping or managing your Housekeeping? N  Some recent data might be hidden    Patient Care Team: Fayrene Helper, MD as PCP - General Herminio Commons, MD as PCP - Cardiology (Cardiology) Gala Romney Cristopher Estimable, MD as Consulting Physician (Gastroenterology)    Assessment:   This is a routine wellness examination for Karen [name].  Exercise Activities and Dietary recommendations Current Exercise Habits: Home exercise  routine, Type of exercise: walking, Time (Minutes): 25, Frequency (Times/Week): 2, Weekly Exercise (Minutes/Week): 50, Intensity: Mild, Exercise limited by: cardiac condition(s);orthopedic condition(s)  Goals    . Exercise 3x per week (30 min per time)     Recommend you increase your exercise program at least 3 days a week for 30-45 minutes at a time as tolerated.      . Weight (lb) < 200 lb (90.7 kg)       Fall Risk Fall Risk  04/22/2018 02/28/2018 02/13/2018 04/11/2017 02/13/2017  Falls in the past year? 0 No No No No  Risk for fall due to : - - - - -  Risk for fall due to: Comment - - - - -   Is the patient's home free of loose throw rugs in walkways, pet beds, electrical cords, etc?   no      Grab bars in the bathroom? yes      Handrails on the stairs?   yes      Adequate lighting?   yes  Timed Get Up and Go performed: Patient able to perform in 6 seconds without assistance   Depression Screen PHQ 2/9 Scores 04/22/2018 02/28/2018 02/13/2018 04/11/2017  PHQ - 2 Score 0 0 0 0  PHQ- 9 Score - - - -     Cognitive Function     6CIT Screen 04/22/2018 04/11/2017  What Year? 0 points 0 points  What month? 0 points 0 points  What time? 3 points 0 points  Count back from 20 0 points 0 points  Months in reverse 0 points 0 points  Repeat phrase 0 points 0 points  Total Score 3 0    Immunization History  Administered Date(s) Administered  . H1N1 04/17/2008  . Influenza Whole 03/29/2006, 07/30/2008, 03/26/2009  . Influenza,inj,Quad PF,6+ Mos 02/20/2013, 03/23/2014, 04/07/2015, 03/21/2016, 02/13/2017, 02/14/2018  . Pneumococcal Conjugate-13 07/16/2014  . Pneumococcal Polysaccharide-23 02/20/2013  . Td 04/27/2004  . Varicella 02/22/2013    Qualifies for Shingles Vaccine?up to date   Screening Tests Health Maintenance  Topic Date Due  . TETANUS/TDAP  08/14/2018 (Originally 04/27/2014)  . MAMMOGRAM  08/17/2019  . COLONOSCOPY  05/24/2020  . INFLUENZA VACCINE  Completed  . DEXA  SCAN  Completed  . Hepatitis C Screening  Completed  . PNA vac Low Risk Adult  Completed    Cancer Screenings: Lung: Low Dose CT Chest recommended if Age 29-80 years, 30 pack-year currently smoking OR have quit w/in 15years. Patient does not qualify. Breast:  Up to date on Mammogram? Yes   Up to date of Bone Density/Dexa? Yes Colorectal: up to date   Additional Screenings:  Hepatitis C Screening: complete      Plan:   Continue to increase physical activity and lose weight   I have personally reviewed and noted the following in the patient's chart:   . Medical and social history . Use of alcohol, tobacco or illicit drugs  . Current medications and supplements . Functional ability and status . Nutritional status . Physical activity . Advanced directives . List of other physicians . Hospitalizations, surgeries, and ER visits in previous 12 months . Vitals . Screenings to include cognitive, depression, and falls . Referrals and appointments  In addition, I have reviewed and discussed with patient certain preventive protocols, quality metrics, and best practice recommendations. A written personalized care plan for preventive services as well as general preventive health recommendations were provided to patient.     Hayden Pedro, LPN  50/08/7046

## 2018-04-22 NOTE — Patient Instructions (Signed)
[name] , Thank you for taking time to come for your Medicare Wellness Visit. I appreciate your ongoing commitment to your health goals. Please review the following plan we discussed and let me know if I can assist you in the future.   Screening recommendations/referrals: Colonoscopy: up to date  Mammogram: up to date  Bone Density: up to date  Recommended yearly ophthalmology/optometry visit for glaucoma screening and checkup Recommended yearly dental visit for hygiene and checkup  Vaccinations: Influenza vaccine: up to date  Pneumococcal vaccine: up to date  Tdap vaccine: up to date  Shingles vaccine: up to date     Advanced directives: information provided   Conditions/risks identified: hypertension, obesity   Next appointment: Wellness in one year    Preventive Care 15 Years and Older, Female Preventive care refers to lifestyle choices and visits with your health care provider that can promote health and wellness. What does preventive care include?  A yearly physical exam. This is also called an annual well check.  Dental exams once or twice a year.  Routine eye exams. Ask your health care provider how often you should have your eyes checked.  Personal lifestyle choices, including:  Daily care of your teeth and gums.  Regular physical activity.  Eating a healthy diet.  Avoiding tobacco and drug use.  Limiting alcohol use.  Practicing safe sex.  Taking low-dose aspirin every day.  Taking vitamin and mineral supplements as recommended by your health care provider. What happens during an annual well check? The services and screenings done by your health care provider during your annual well check will depend on your age, overall health, lifestyle risk factors, and family history of disease. Counseling  Your health care provider may ask you questions about your:  Alcohol use.  Tobacco use.  Drug use.  Emotional well-being.  Home and relationship  well-being.  Sexual activity.  Eating habits.  History of falls.  Memory and ability to understand (cognition).  Work and work Statistician.  Reproductive health. Screening  You may have the following tests or measurements:  Height, weight, and BMI.  Blood pressure.  Lipid and cholesterol levels. These may be checked every 5 years, or more frequently if you are over 43 years old.  Skin check.  Lung cancer screening. You may have this screening every year starting at age 55 if you have a 30-pack-year history of smoking and currently smoke or have quit within the past 15 years.  Fecal occult blood test (FOBT) of the stool. You may have this test every year starting at age 41.  Flexible sigmoidoscopy or colonoscopy. You may have a sigmoidoscopy every 5 years or a colonoscopy every 10 years starting at age 75.  Hepatitis C blood test.  Hepatitis B blood test.  Sexually transmitted disease (STD) testing.  Diabetes screening. This is done by checking your blood sugar (glucose) after you have not eaten for a while (fasting). You may have this done every 1-3 years.  Bone density scan. This is done to screen for osteoporosis. You may have this done starting at age 59.  Mammogram. This may be done every 1-2 years. Talk to your health care provider about how often you should have regular mammograms. Talk with your health care provider about your test results, treatment options, and if necessary, the need for more tests. Vaccines  Your health care provider may recommend certain vaccines, such as:  Influenza vaccine. This is recommended every year.  Tetanus, diphtheria, and acellular pertussis (  Tdap, Td) vaccine. You may need a Td booster every 10 years.  Zoster vaccine. You may need this after age 56.  Pneumococcal 13-valent conjugate (PCV13) vaccine. One dose is recommended after age 22.  Pneumococcal polysaccharide (PPSV23) vaccine. One dose is recommended after age  51. Talk to your health care provider about which screenings and vaccines you need and how often you need them. This information is not intended to replace advice given to you by your health care provider. Make sure you discuss any questions you have with your health care provider. Document Released: 06/25/2015 Document Revised: 02/16/2016 Document Reviewed: 03/30/2015 Elsevier Interactive Patient Education  2017 [place] Prevention in the Home Falls can cause injuries. They can happen to people of all ages. There are many things you can do to make your home safe and to help prevent falls. What can I do on the outside of my home?  Regularly fix the edges of walkways and driveways and fix any cracks.  Remove anything that might make you trip as you walk through a door, such as a raised step or threshold.  Trim any bushes or trees on the path to your home.  Use bright outdoor lighting.  Clear any walking paths of anything that might make someone trip, such as rocks or tools.  Regularly check to see if handrails are loose or broken. Make sure that both sides of any steps have handrails.  Any raised decks and porches should have guardrails on the edges.  Have any leaves, snow, or ice cleared regularly.  Use sand or salt on walking paths during winter.  Clean up any spills in your garage right away. This includes oil or grease spills. What can I do in the bathroom?  Use night lights.  Install grab bars by the toilet and in the tub and shower. Do not use towel bars as grab bars.  Use non-skid mats or decals in the tub or shower.  If you need to sit down in the shower, use a plastic, non-slip stool.  Keep the floor dry. Clean up any water that spills on the floor as soon as it happens.  Remove soap buildup in the tub or shower regularly.  Attach bath mats securely with double-sided non-slip rug tape.  Do not have throw rugs and other things on the floor that can make  you trip. What can I do in the bedroom?  Use night lights.  Make sure that you have a light by your bed that is easy to reach.  Do not use any sheets or blankets that are too big for your bed. They should not hang down onto the floor.  Have a firm chair that has side arms. You can use this for support while you get dressed.  Do not have throw rugs and other things on the floor that can make you trip. What can I do in the kitchen?  Clean up any spills right away.  Avoid walking on wet floors.  Keep items that you use a lot in easy-to-reach places.  If you need to reach something above you, use a strong step stool that has a grab bar.  Keep electrical cords out of the way.  Do not use floor polish or wax that makes floors slippery. If you must use wax, use non-skid floor wax.  Do not have throw rugs and other things on the floor that can make you trip. What can I do with my stairs?  Do  not leave any items on the stairs.  Make sure that there are handrails on both sides of the stairs and use them. Fix handrails that are broken or loose. Make sure that handrails are as long as the stairways.  Check any carpeting to make sure that it is firmly attached to the stairs. Fix any carpet that is loose or worn.  Avoid having throw rugs at the top or bottom of the stairs. If you do have throw rugs, attach them to the floor with carpet tape.  Make sure that you have a light switch at the top of the stairs and the bottom of the stairs. If you do not have them, ask someone to add them for you. What else can I do to help prevent falls?  Wear shoes that:  Do not have high heels.  Have rubber bottoms.  Are comfortable and fit you well.  Are closed at the toe. Do not wear sandals.  If you use a stepladder:  Make sure that it is fully opened. Do not climb a closed stepladder.  Make sure that both sides of the stepladder are locked into place.  Ask someone to hold it for you, if  possible.  Clearly mark and make sure that you can see:  Any grab bars or handrails.  First and last steps.  Where the edge of each step is.  Use tools that help you move around (mobility aids) if they are needed. These include:  Canes.  Walkers.  Scooters.  Crutches.  Turn on the lights when you go into a dark area. Replace any light bulbs as soon as they burn out.  Set up your furniture so you have a clear path. Avoid moving your furniture around.  If any of your floors are uneven, fix them.  If there are any pets around you, be aware of where they are.  Review your medicines with your doctor. Some medicines can make you feel dizzy. This can increase your chance of falling. Ask your doctor what other things that you can do to help prevent falls. This information is not intended to replace advice given to you by your health care provider. Make sure you discuss any questions you have with your health care provider. Document Released: 03/25/2009 Document Revised: 11/04/2015 Document Reviewed: 07/03/2014 Elsevier Interactive Patient Education  2017 [employer].

## 2018-06-20 ENCOUNTER — Encounter: Payer: Self-pay | Admitting: *Deleted

## 2018-08-20 ENCOUNTER — Encounter: Payer: Medicare HMO | Admitting: Family Medicine

## 2018-09-30 DIAGNOSIS — R7302 Impaired glucose tolerance (oral): Secondary | ICD-10-CM | POA: Diagnosis not present

## 2018-09-30 DIAGNOSIS — E559 Vitamin D deficiency, unspecified: Secondary | ICD-10-CM | POA: Diagnosis not present

## 2018-09-30 DIAGNOSIS — E785 Hyperlipidemia, unspecified: Secondary | ICD-10-CM | POA: Diagnosis not present

## 2018-09-30 DIAGNOSIS — I10 Essential (primary) hypertension: Secondary | ICD-10-CM | POA: Diagnosis not present

## 2018-09-30 DIAGNOSIS — E8881 Metabolic syndrome: Secondary | ICD-10-CM | POA: Diagnosis not present

## 2018-10-01 LAB — CBC
HCT: 37.2 % (ref 35.0–45.0)
Hemoglobin: 12.4 g/dL (ref 11.7–15.5)
MCH: 29.7 pg (ref 27.0–33.0)
MCHC: 33.3 g/dL (ref 32.0–36.0)
MCV: 89.2 fL (ref 80.0–100.0)
MPV: 10.3 fL (ref 7.5–12.5)
PLATELETS: 337 10*3/uL (ref 140–400)
RBC: 4.17 10*6/uL (ref 3.80–5.10)
RDW: 12.1 % (ref 11.0–15.0)
WBC: 4.1 10*3/uL (ref 3.8–10.8)

## 2018-10-01 LAB — COMPLETE METABOLIC PANEL WITH GFR
AG RATIO: 1.4 (calc) (ref 1.0–2.5)
ALBUMIN MSPROF: 4 g/dL (ref 3.6–5.1)
ALT: 14 U/L (ref 6–29)
AST: 18 U/L (ref 10–35)
Alkaline phosphatase (APISO): 65 U/L (ref 37–153)
BUN: 18 mg/dL (ref 7–25)
CALCIUM: 10.1 mg/dL (ref 8.6–10.4)
CO2: 29 mmol/L (ref 20–32)
CREATININE: 0.8 mg/dL (ref 0.60–0.93)
Chloride: 104 mmol/L (ref 98–110)
GFR, EST NON AFRICAN AMERICAN: 74 mL/min/{1.73_m2} (ref >=60)
GFR, Est African American: 86 mL/min/{1.73_m2} (ref >=60)
GLOBULIN: 2.8 g/dL (ref 1.9–3.7)
Glucose, Bld: 91 mg/dL (ref 65–99)
POTASSIUM: 4 mmol/L (ref 3.5–5.3)
SODIUM: 139 mmol/L (ref 135–146)
Total Bilirubin: 0.5 mg/dL (ref 0.2–1.2)
Total Protein: 6.8 g/dL (ref 6.1–8.1)

## 2018-10-01 LAB — LIPID PANEL
CHOL/HDL RATIO: 3.5 (calc) (ref <5.0)
CHOLESTEROL: 176 mg/dL (ref <200)
HDL: 50 mg/dL (ref >=50)
LDL Cholesterol (Calc): 111 mg/dL (calc) — ABNORMAL HIGH
Non-HDL Cholesterol (Calc): 126 mg/dL (calc) (ref <130)
Triglycerides: 67 mg/dL (ref <150)

## 2018-10-01 LAB — HEMOGLOBIN A1C
EAG (MMOL/L): 6.6 (calc)
Hgb A1c MFr Bld: 5.8 % of total Hgb — ABNORMAL HIGH (ref <5.7)
Mean Plasma Glucose: 120 (calc)

## 2018-10-01 LAB — VITAMIN D 25 HYDROXY (VIT D DEFICIENCY, FRACTURES): Vit D, 25-Hydroxy: 36 ng/mL (ref 30–100)

## 2018-10-01 LAB — TSH: TSH: 1.17 m[IU]/L (ref 0.40–4.50)

## 2018-10-03 ENCOUNTER — Encounter: Payer: Medicare HMO | Admitting: Family Medicine

## 2018-10-06 ENCOUNTER — Encounter: Payer: Self-pay | Admitting: Orthopaedic Surgery

## 2018-10-07 ENCOUNTER — Encounter: Payer: Self-pay | Admitting: *Deleted

## 2018-10-13 ENCOUNTER — Other Ambulatory Visit: Payer: Self-pay | Admitting: Family Medicine

## 2018-10-21 ENCOUNTER — Other Ambulatory Visit (HOSPITAL_COMMUNITY): Payer: Self-pay | Admitting: Family Medicine

## 2018-10-21 DIAGNOSIS — Z1231 Encounter for screening mammogram for malignant neoplasm of breast: Secondary | ICD-10-CM

## 2018-10-25 ENCOUNTER — Other Ambulatory Visit: Payer: Self-pay

## 2018-10-25 ENCOUNTER — Ambulatory Visit (HOSPITAL_COMMUNITY)
Admission: RE | Admit: 2018-10-25 | Discharge: 2018-10-25 | Disposition: A | Discharge status: Home / Self Care | Payer: Medicare PPO | Source: Ambulatory Visit | Attending: Family Medicine | Admitting: Family Medicine

## 2018-10-25 DIAGNOSIS — Z1231 Encounter for screening mammogram for malignant neoplasm of breast: Secondary | ICD-10-CM | POA: Diagnosis not present

## 2018-10-25 IMAGING — MG DIGITAL SCREENING BILATERAL MAMMOGRAM WITH TOMO AND CAD
8 series · 8 of 24 positions shown · non-contrast
Comparison: Previous exam(s).

CLINICAL DATA: Screening.

EXAM:
DIGITAL SCREENING BILATERAL MAMMOGRAM WITH TOMO AND CAD

[R CC synth-2D]
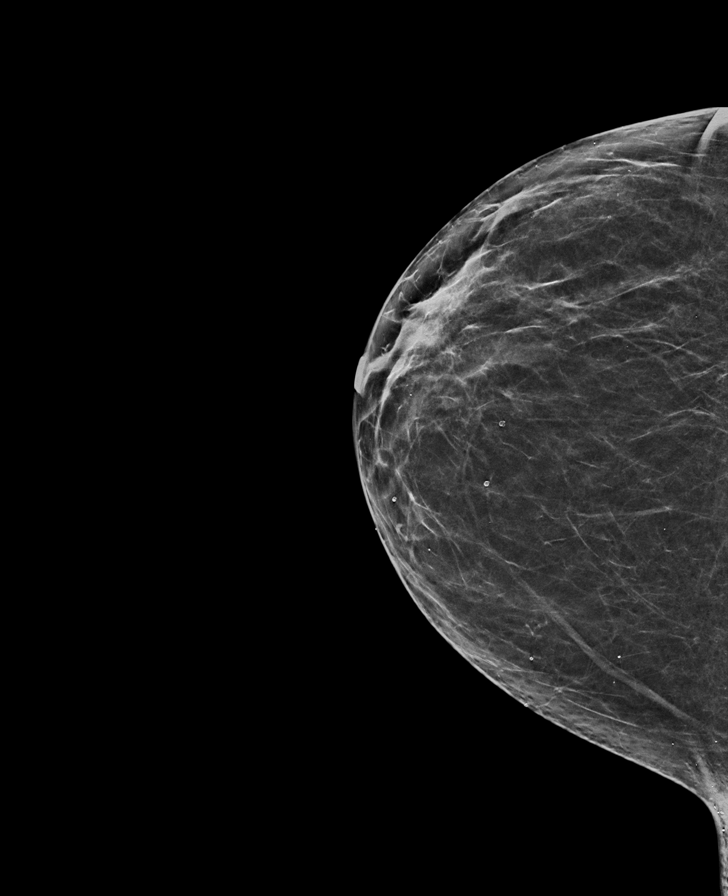

[R MLO synth-2D]
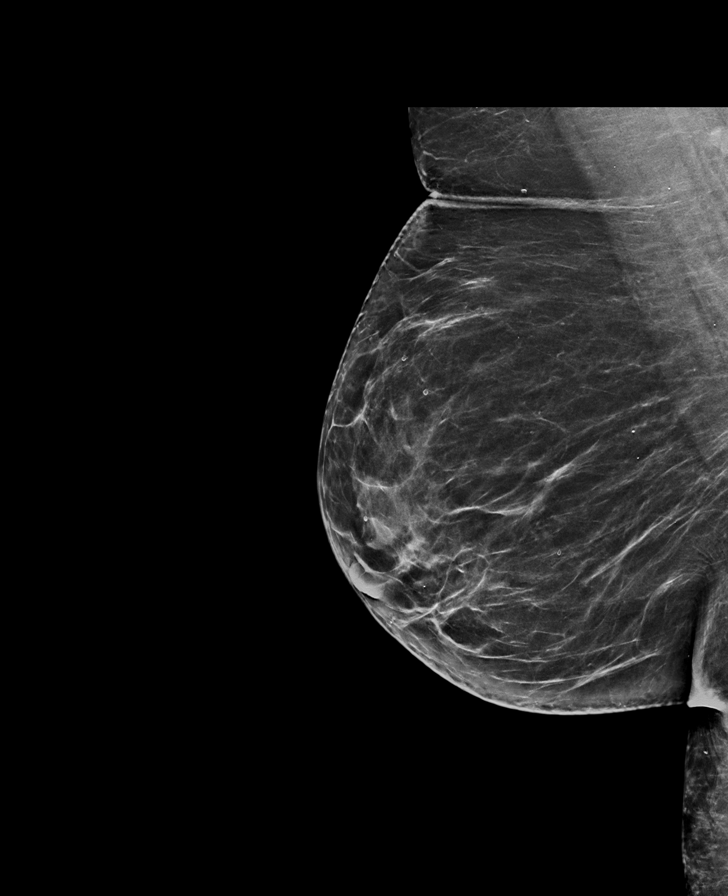

[L CC synth-2D]
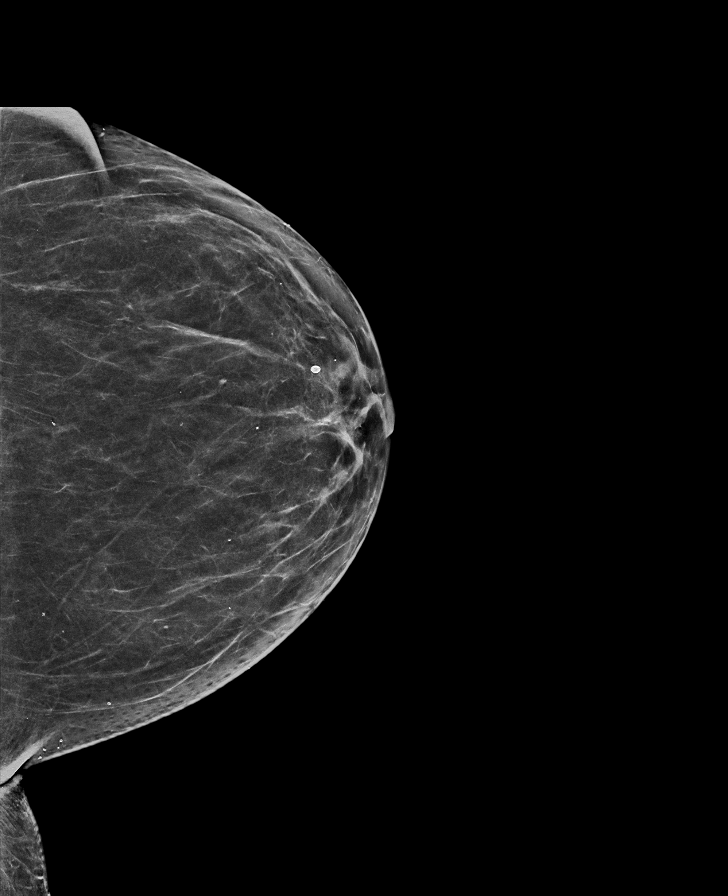

[L MLO synth-2D]
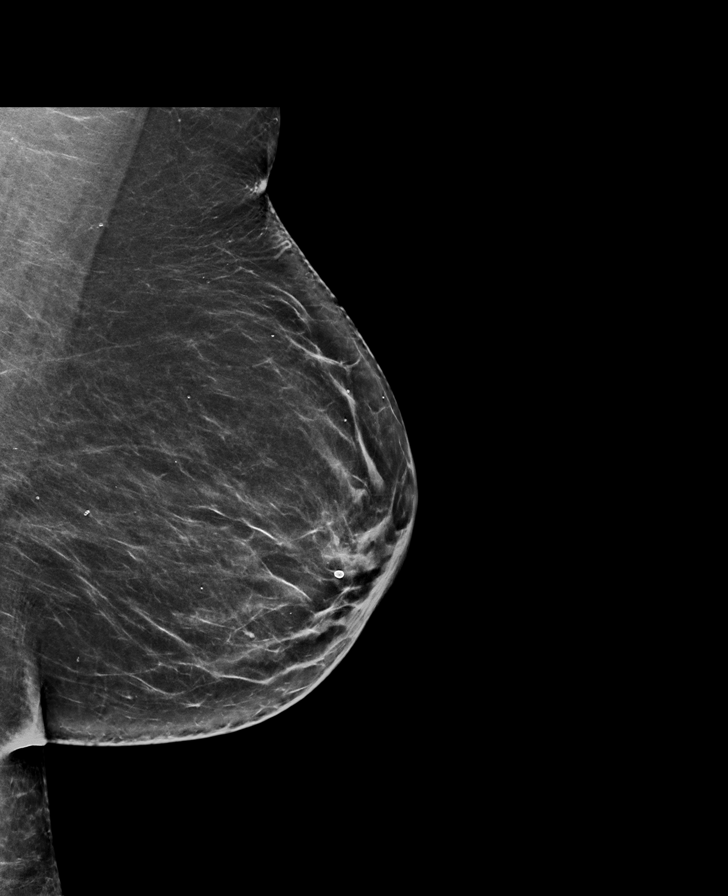

[L MLO tomo · tomo slice 36/71.0]
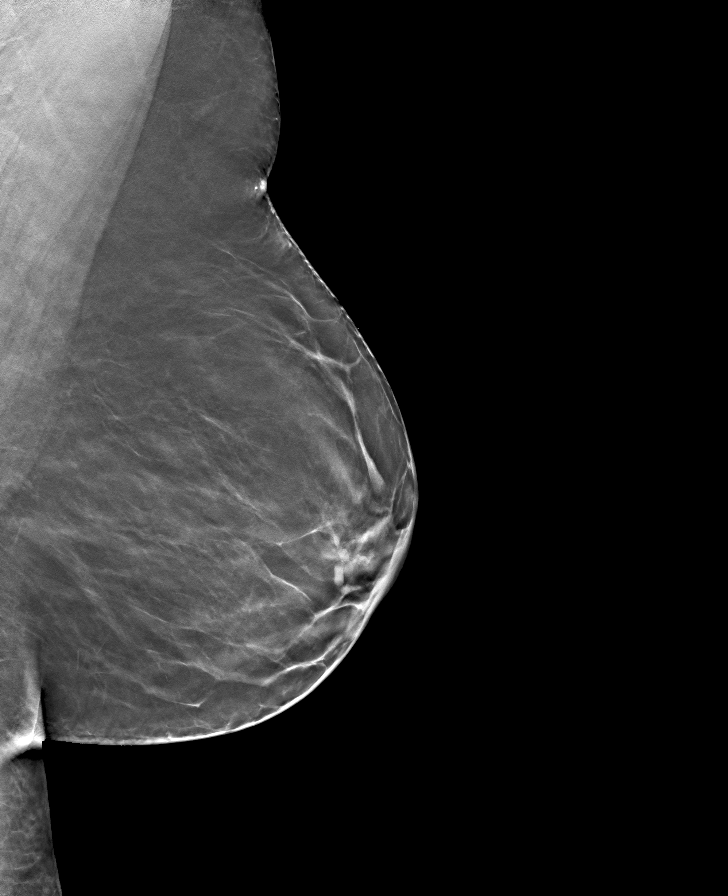

[L CC tomo · tomo slice 34/67.0]
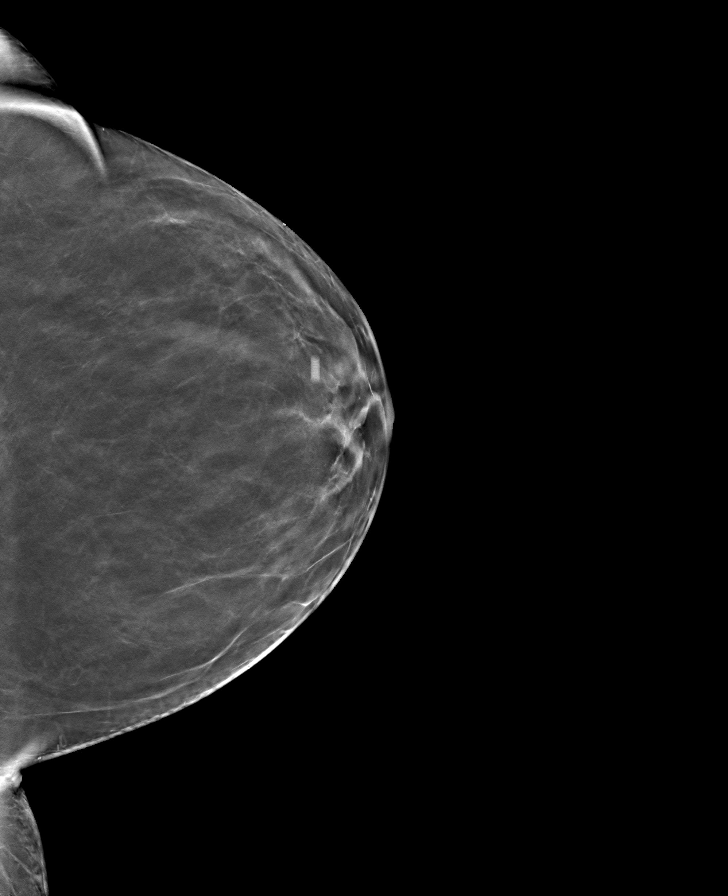

[R CC tomo · tomo slice 31/62.0]
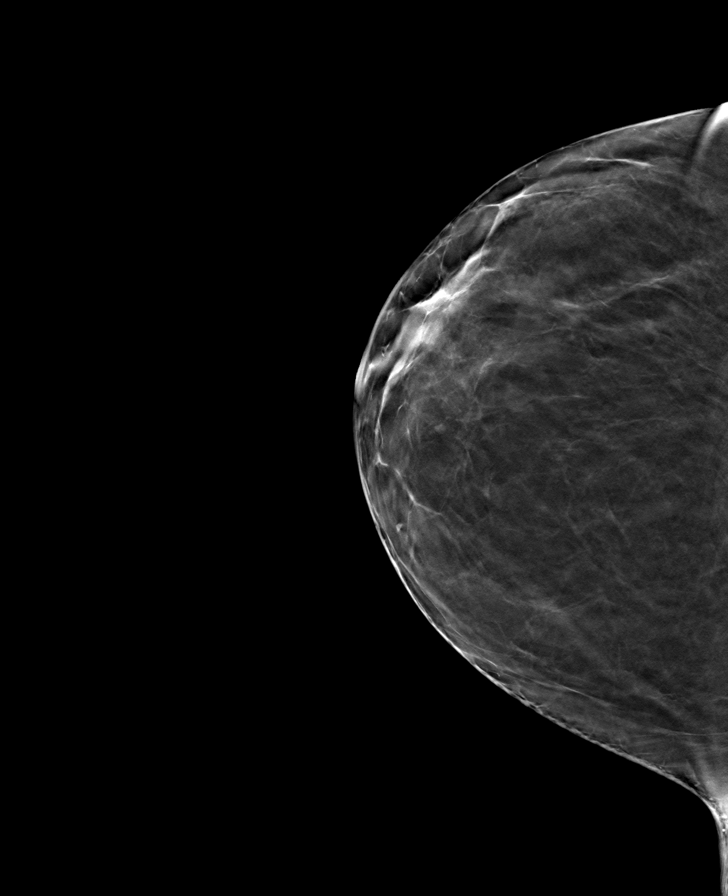

[R MLO tomo · tomo slice 37/73.0]
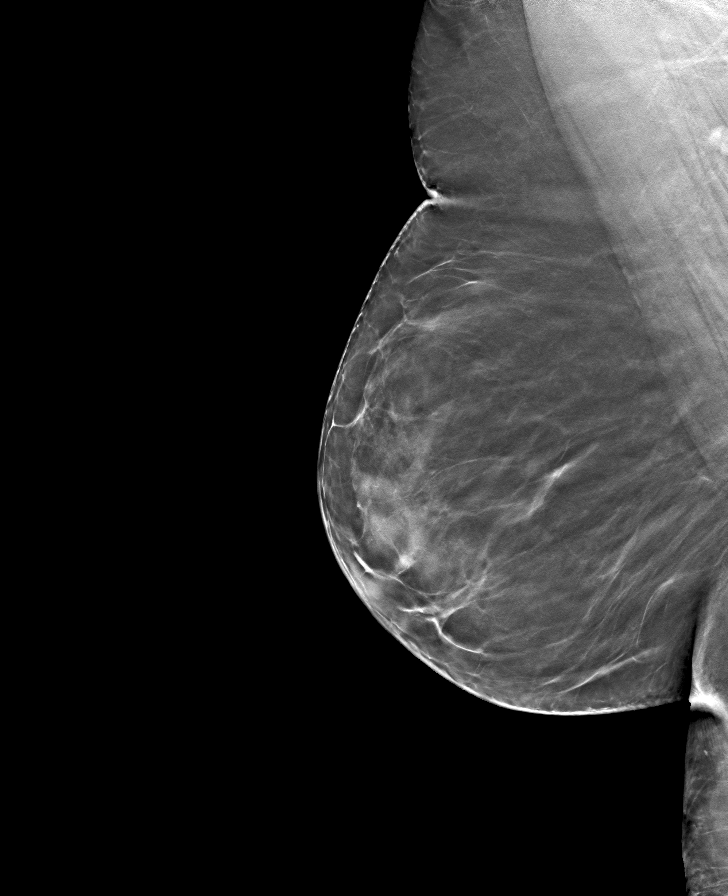

[8 of 24 positions shown; findings below may reference images not displayed]

ACR Breast Density Category b: There are scattered areas of
fibroglandular density.
FINDINGS: There are no findings suspicious for malignancy. Images were
processed with CAD.
IMPRESSION: No mammographic evidence of malignancy. A result letter of this
screening mammogram will be mailed directly to the patient.

RECOMMENDATION:
Screening mammogram in one year. (Code:[TQ])

BI-RADS CATEGORY  1: Negative.

## 2018-12-04 ENCOUNTER — Encounter: Payer: Self-pay | Admitting: Family Medicine

## 2018-12-04 ENCOUNTER — Ambulatory Visit (INDEPENDENT_AMBULATORY_CARE_PROVIDER_SITE_OTHER): Payer: Medicare PPO | Admitting: Family Medicine

## 2018-12-04 ENCOUNTER — Encounter (INDEPENDENT_AMBULATORY_CARE_PROVIDER_SITE_OTHER): Payer: Self-pay

## 2018-12-04 ENCOUNTER — Other Ambulatory Visit: Payer: Self-pay

## 2018-12-04 VITALS — BP 122/80 | HR 74 | Temp 98.3°F | Ht 66.0 in | Wt 209.0 lb

## 2018-12-04 DIAGNOSIS — I10 Essential (primary) hypertension: Secondary | ICD-10-CM

## 2018-12-04 DIAGNOSIS — Z0000 Encounter for general adult medical examination without abnormal findings: Secondary | ICD-10-CM | POA: Diagnosis not present

## 2018-12-04 DIAGNOSIS — E8881 Metabolic syndrome: Secondary | ICD-10-CM

## 2018-12-04 DIAGNOSIS — E785 Hyperlipidemia, unspecified: Secondary | ICD-10-CM

## 2018-12-04 NOTE — Patient Instructions (Signed)
F/U end November, call if you need me sooner  Please get shingrix vaccines at your pharmacy (2)   Stay on vit D 2000 IU 5 days per week, and Ok to take 5000 IU the other 2 days  Please reduce butter and sweets and starchy foods  It is important that you exercise regularly at least 30 minutes 5 times a week. If you develop chest pain, have severe difficulty breathing, or feel very tired, stop exercising immediately and seek medical attention   Fasting lipid, cmp and eGFr and hBA1C 1 week before follow up  Weight loss goal of 10 pounds to get to 199:-)  Thanks for choosing [facility], we consider it a privelige to serve you. Social distancing. Frequent hand washing with soap and water Keeping your hands off of your face. These 3 practices will help to keep both you and your community healthy during this time. Please practice them faithfully!

## 2018-12-08 ENCOUNTER — Encounter: Payer: Self-pay | Admitting: Family Medicine

## 2018-12-08 NOTE — Assessment & Plan Note (Signed)

## 2018-12-08 NOTE — Progress Notes (Signed)
    Karen [name]     MRN: [id number]      DOB: April 12, 1946  HPI: Patient is in for annual physical exam. No other health concerns are expressed or addressed at the visit. Recent labs,  are reviewed.No new meds, lifestyle changes discussed Immunization is reviewed , and  shingrix recommended   PE: BP 122/80   Pulse 74   Temp 98.3 F (36.8 C) (Temporal)   Ht 5\' 6"  (1.676 m)   Wt 209 lb (94.8 kg)   SpO2 97%   BMI 33.73 kg/m   Pleasant  female, alert and oriented x 3, in no cardio-pulmonary distress. Afebrile. HEENT No facial trauma or asymetry. Sinuses non tender.  Extra occullar muscles intact, pupils equally reactive to light. External ears normal, tympanic membranes clear. Oropharynx moist, no exudate. Neck: supple, no adenopathy,JVD or thyromegaly.No bruits.  Chest: Clear to ascultation bilaterally.No crackles or wheezes. Non tender to palpation    Cardiovascular system; Heart sounds normal,  S1 and  S2 ,no S3.  No murmur, or thrill. Apical beat not displaced Peripheral pulses normal.    Musculoskeletal exam: Decreased though adequate  ROM of spine, hips , shoulders and knees. No deformity ,swelling or crepitus noted. No muscle wasting or atrophy.   Neurologic: Cranial nerves 2 to 12 intact. Power, tone ,sensation and reflexes normal throughout. No disturbance in gait. No tremor.  Skin: Intact, no ulceration, erythema , scaling or rash noted. Pigmentation normal throughout  Psych; Normal mood and affect. Judgement and concentration normal   Assessment & Plan:  Annual physical exam Annual exam as documented. Counseling done  re healthy lifestyle involving commitment to 150 minutes exercise per week, heart healthy diet, and attaining healthy weight.The importance of adequate sleep also discussed. Regular seat belt use and home safety, is also discussed. Changes in health habits are decided on by the patient with goals and time frames  set for  achieving them. Immunization and cancer screening needs are specifically addressed at this visit.

## 2018-12-23 ENCOUNTER — Other Ambulatory Visit: Payer: Medicare PPO

## 2018-12-23 ENCOUNTER — Other Ambulatory Visit: Payer: Self-pay

## 2018-12-23 DIAGNOSIS — R6889 Other general symptoms and signs: Secondary | ICD-10-CM | POA: Diagnosis not present

## 2018-12-23 DIAGNOSIS — Z20822 Contact with and (suspected) exposure to covid-19: Secondary | ICD-10-CM

## 2018-12-27 LAB — NOVEL CORONAVIRUS, NAA: SARS-CoV-2, NAA: NOT DETECTED

## 2019-01-13 ENCOUNTER — Other Ambulatory Visit: Payer: Self-pay | Admitting: Family Medicine

## 2019-02-11 ENCOUNTER — Ambulatory Visit (INDEPENDENT_AMBULATORY_CARE_PROVIDER_SITE_OTHER): Payer: Medicare PPO

## 2019-02-11 ENCOUNTER — Other Ambulatory Visit: Payer: Self-pay

## 2019-02-11 DIAGNOSIS — Z23 Encounter for immunization: Secondary | ICD-10-CM | POA: Diagnosis not present

## 2019-03-08 ENCOUNTER — Other Ambulatory Visit: Payer: Self-pay | Admitting: Family Medicine

## 2019-04-09 ENCOUNTER — Other Ambulatory Visit: Payer: Self-pay

## 2019-04-09 DIAGNOSIS — Z20822 Contact with and (suspected) exposure to covid-19: Secondary | ICD-10-CM

## 2019-04-10 LAB — NOVEL CORONAVIRUS, NAA: SARS-CoV-2, NAA: DETECTED — AB

## 2019-04-11 ENCOUNTER — Telehealth: Payer: Self-pay | Admitting: *Deleted

## 2019-04-11 NOTE — Telephone Encounter (Signed)
Please advise 

## 2019-04-11 NOTE — Telephone Encounter (Signed)
Pt wanted to let dr simpson know that she had tested positive for covid. She got her results yesterday but she was wondering how dr simpson wanted to proceed. She said her friend went to urgent care and they gave her antibiotic and she didn't know if Dr Moshe Cipro wanted her to have one since she was positive for the covid. Pt would like a call back.

## 2019-04-11 NOTE — Telephone Encounter (Signed)
I spoke directly with  Patient, advised self isolation, for 2 weeks,supportive care and to go to the hospital if she develops shortness of breath and difficulty v breathing, or marked deterioration in her health, now she feels well. She verbalized undrstanding

## 2019-04-14 ENCOUNTER — Other Ambulatory Visit: Payer: Self-pay | Admitting: Family Medicine

## 2019-04-23 ENCOUNTER — Ambulatory Visit: Payer: Medicare HMO

## 2019-04-24 ENCOUNTER — Other Ambulatory Visit: Payer: Self-pay

## 2019-04-24 ENCOUNTER — Ambulatory Visit (INDEPENDENT_AMBULATORY_CARE_PROVIDER_SITE_OTHER): Payer: Medicare PPO | Admitting: Family Medicine

## 2019-04-24 ENCOUNTER — Encounter: Payer: Self-pay | Admitting: Family Medicine

## 2019-04-24 VITALS — BP 122/80 | HR 74 | Resp 15 | Ht 66.0 in | Wt 209.0 lb

## 2019-04-24 DIAGNOSIS — Z0000 Encounter for general adult medical examination without abnormal findings: Secondary | ICD-10-CM

## 2019-04-24 NOTE — Patient Instructions (Signed)
[name] , Thank you for taking time to come for your Medicare Wellness Visit. I appreciate your ongoing commitment to your health goals. Please review the following plan we discussed and let me know if I can assist you in the future.   Please continue to practice social distancing to keep you, your family, and our community safe.  If you must go out, please wear a Mask and practice good handwashing.  Happy Holidays!  Screening recommendations/referrals: Colonoscopy: Due 2021 Mammogram: up to date Bone Density: up to date Recommended yearly ophthalmology/optometry visit for glaucoma screening and checkup Recommended yearly dental visit for hygiene and checkup  Vaccinations: Influenza vaccine: completed, Due  Pneumococcal vaccine: completed Tdap vaccine: was due 2015 Shingles vaccine: You reported you will work on getting the first one in Dec/Jan time frame  Advanced directives: Please let us know if we can help with this.  Conditions/risks identified: Falls  Next appointment: 05/01/2019  Preventive Care 65 Years and Older, Female Preventive care refers to lifestyle choices and visits with your health care provider that can promote health and wellness. What does preventive care include?  A yearly physical exam. This is also called an annual well check.  Dental exams once or twice a year.  Routine eye exams. Ask your health care provider how often you should have your eyes checked.  Personal lifestyle choices, including:  Daily care of your teeth and gums.  Regular physical activity.  Eating a healthy diet.  Avoiding tobacco and drug use.  Limiting alcohol use.  Practicing safe sex.  Taking low-dose aspirin every day.  Taking vitamin and mineral supplements as recommended by your health care provider. What happens during an annual well check? The services and screenings done by your health care provider during your annual well check will depend on your age,  overall health, lifestyle risk factors, and family history of disease. Counseling  Your health care provider may ask you questions about your:  Alcohol use.  Tobacco use.  Drug use.  Emotional well-being.  Home and relationship well-being.  Sexual activity.  Eating habits.  History of falls.  Memory and ability to understand (cognition).  Work and work Statistician.  Reproductive health. Screening  You may have the following tests or measurements:  Height, weight, and BMI.  Blood pressure.  Lipid and cholesterol levels. These may be checked every 5 years, or more frequently if you are over 2 years old.  Skin check.  Lung cancer screening. You may have this screening every year starting at age 82 if you have a 30-pack-year history of smoking and currently smoke or have quit within the past 15 years.  Fecal occult blood test (FOBT) of the stool. You may have this test every year starting at age 55.  Flexible sigmoidoscopy or colonoscopy. You may have a sigmoidoscopy every 5 years or a colonoscopy every 10 years starting at age 51.  Hepatitis C blood test.  Hepatitis B blood test.  Sexually transmitted disease (STD) testing.  Diabetes screening. This is done by checking your blood sugar (glucose) after you have not eaten for a while (fasting). You may have this done every 1-3 years.  Bone density scan. This is done to screen for osteoporosis. You may have this done starting at age 39.  Mammogram. This may be done every 1-2 years. Talk to your health care provider about how often you should have regular mammograms. Talk with your health care provider about your test results, treatment options, and if  necessary, the need for more tests. Vaccines  Your health care provider may recommend certain vaccines, such as:  Influenza vaccine. This is recommended every year.  Tetanus, diphtheria, and acellular pertussis (Tdap, Td) vaccine. You may need a Td booster every 10  years.  Zoster vaccine. You may need this after age 20.  Pneumococcal 13-valent conjugate (PCV13) vaccine. One dose is recommended after age 62.  Pneumococcal polysaccharide (PPSV23) vaccine. One dose is recommended after age 70. Talk to your health care provider about which screenings and vaccines you need and how often you need them. This information is not intended to replace advice given to you by your health care provider. Make sure you discuss any questions you have with your health care provider. Document Released: 06/25/2015 Document Revised: 02/16/2016 Document Reviewed: 03/30/2015 Elsevier Interactive Patient Education  2017 [place] Prevention in the Home Falls can cause injuries. They can happen to people of all ages. There are many things you can do to make your home safe and to help prevent falls. What can I do on the outside of my home?  Regularly fix the edges of walkways and driveways and fix any cracks.  Remove anything that might make you trip as you walk through a door, such as a raised step or threshold.  Trim any bushes or trees on the path to your home.  Use bright outdoor lighting.  Clear any walking paths of anything that might make someone trip, such as rocks or tools.  Regularly check to see if handrails are loose or broken. Make sure that both sides of any steps have handrails.  Any raised decks and porches should have guardrails on the edges.  Have any leaves, snow, or ice cleared regularly.  Use sand or salt on walking paths during winter.  Clean up any spills in your garage right away. This includes oil or grease spills. What can I do in the bathroom?  Use night lights.  Install grab bars by the toilet and in the tub and shower. Do not use towel bars as grab bars.  Use non-skid mats or decals in the tub or shower.  If you need to sit down in the shower, use a plastic, non-slip stool.  Keep the floor dry. Clean up any water that  spills on the floor as soon as it happens.  Remove soap buildup in the tub or shower regularly.  Attach bath mats securely with double-sided non-slip rug tape.  Do not have throw rugs and other things on the floor that can make you trip. What can I do in the bedroom?  Use night lights.  Make sure that you have a light by your bed that is easy to reach.  Do not use any sheets or blankets that are too big for your bed. They should not hang down onto the floor.  Have a firm chair that has side arms. You can use this for support while you get dressed.  Do not have throw rugs and other things on the floor that can make you trip. What can I do in the kitchen?  Clean up any spills right away.  Avoid walking on wet floors.  Keep items that you use a lot in easy-to-reach places.  If you need to reach something above you, use a strong step stool that has a grab bar.  Keep electrical cords out of the way.  Do not use floor polish or wax that makes floors slippery. If you must  use wax, use non-skid floor wax.  Do not have throw rugs and other things on the floor that can make you trip. What can I do with my stairs?  Do not leave any items on the stairs.  Make sure that there are handrails on both sides of the stairs and use them. Fix handrails that are broken or loose. Make sure that handrails are as long as the stairways.  Check any carpeting to make sure that it is firmly attached to the stairs. Fix any carpet that is loose or worn.  Avoid having throw rugs at the top or bottom of the stairs. If you do have throw rugs, attach them to the floor with carpet tape.  Make sure that you have a light switch at the top of the stairs and the bottom of the stairs. If you do not have them, ask someone to add them for you. What else can I do to help prevent falls?  Wear shoes that:  Do not have high heels.  Have rubber bottoms.  Are comfortable and fit you well.  Are closed at the  toe. Do not wear sandals.  If you use a stepladder:  Make sure that it is fully opened. Do not climb a closed stepladder.  Make sure that both sides of the stepladder are locked into place.  Ask someone to hold it for you, if possible.  Clearly mark and make sure that you can see:  Any grab bars or handrails.  First and last steps.  Where the edge of each step is.  Use tools that help you move around (mobility aids) if they are needed. These include:  Canes.  Walkers.  Scooters.  Crutches.  Turn on the lights when you go into a dark area. Replace any light bulbs as soon as they burn out.  Set up your furniture so you have a clear path. Avoid moving your furniture around.  If any of your floors are uneven, fix them.  If there are any pets around you, be aware of where they are.  Review your medicines with your doctor. Some medicines can make you feel dizzy. This can increase your chance of falling. Ask your doctor what other things that you can do to help prevent falls. This information is not intended to replace advice given to you by your health care provider. Make sure you discuss any questions you have with your health care provider. Document Released: 03/25/2009 Document Revised: 11/04/2015 Document Reviewed: 07/03/2014 Elsevier Interactive Patient Education  2017 [employer].

## 2019-04-24 NOTE — Progress Notes (Signed)
Subjective:   Karen [name] is a 73 y.o. female who presents for Medicare Annual (Subsequent) preventive examination.  Location of Patient: Home Location of Provider: Telehealth Consent was obtain for visit to be over via telehealth.  I verified that I am speaking with the correct person using two identifiers.    Review of Systems:    Cardiac Risk Factors include: advanced age (>62men, >68 women);dyslipidemia;hypertension;obesity (BMI >30kg/m2)     Objective:     Vitals: BP 122/80   Pulse 74   Resp 15   Ht 5\' 6"  (1.676 m)   Wt 209 lb (94.8 kg)   BMI 33.73 kg/m   Body mass index is 33.73 kg/m.  Advanced Directives 04/22/2018 05/24/2017 04/11/2017 07/18/2013 05/26/2013  Does Patient Have a Medical Advance Directive? No No No Patient does not have advance directive Patient does not have advance directive;Patient would not like information  Would patient like information on creating a medical advance directive? Yes (ED - Information included in AVS) No - Patient declined Yes (MAU/Ambulatory/Procedural Areas - Information given) - -  Pre-existing out of facility DNR order (yellow form or pink MOST form) - - - No No    Tobacco Social History   Tobacco Use  Smoking Status Never Smoker  Smokeless Tobacco Never Used     Counseling given: Yes   Clinical Intake:  Pre-visit preparation completed: No  Pain : No/denies pain Pain Score: 0-No pain     BMI - recorded: 33.73 Nutritional Status: BMI > 30  Obese Nutritional Risks: None Diabetes: No  How often do you need to have someone help you when you read instructions, pamphlets, or other written materials from your doctor or pharmacy?: 1 - Never 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 Arthritis   . C. difficile colitis Jul 18, 2013   Vancomycin. Recheck Cdiff positive. Vancomycin continued for an additional 2  weeks  . H/O herpes zoster   . Hyperlipidemia   . Hypertension   . Metabolic syndrome X   . Prediabetes 2014   lifestyle   . Prediabetes    Past Surgical History:  Procedure Laterality Date  . ABDOMINAL HYSTERECTOMY  1983 approx   fibroids, partial  . ANKLE SURGERY    . COLONOSCOPY  04/27/2005   [id number] polyp at the splenic flexure cold biopsied/removed. Mid  descending polyp removed with cold snare technique. The remainder of the of the colon looked normal/ Normal rectum. inflammed adenomatous polyps.  . COLONOSCOPY N/A 05/26/2013   Dr. Rourk:Multiple colonic polyps removed, tubular adenomas. Needs surveillance Dec 2017.   Marland Kitchen COLONOSCOPY N/A 05/24/2017   Procedure: COLONOSCOPY;  Surgeon: Daneil Dolin, MD;  Location: [facility];  Service: Endoscopy;  Laterality: N/A;  1:00 pm  . FRACTURE SURGERY Right mid 1990's   pin put in and waas removed   Family History  Problem Relation Age of Onset  . Hypertension Mother   . Lung cancer Mother   . Hypertension Father   . Hypertension Sister   . Hypertension Brother   . Hypertension Sister   . Diabetes Sister   . Hypertension Son   . Hypertension Son   . Colon cancer Neg Hx    Social History   Socioeconomic History  . Marital status: Married    Spouse name: Not on file  . Number of children: 3  .  Years of education: 18  . Highest education level: 12th grade  Occupational History  . Occupation: fulltime - caregiver   Social Needs  . Financial resource strain: Not hard at all  . Food insecurity    Worry: Never true    Inability: Never true  . Transportation needs    Medical: No    Non-medical: No  Tobacco Use  . Smoking status: Never Smoker  . Smokeless tobacco: Never Used  Substance and Sexual Activity  . Alcohol use: No  . Drug use: No  . Sexual activity: Not Currently    Birth control/protection: Surgical  Lifestyle  . Physical activity    Days per week: 2 days    Minutes per session: 30 min  .  Stress: Only a little  Relationships  . Social connections    Talks on phone: More than three times a week    Gets together: Three times a week    Attends religious service: 1 to 4 times per year    Active member of club or organization: No    Attends meetings of clubs or organizations: Never    Relationship status: Married  Other Topics Concern  . Not on file  Social History Narrative   Lives with husband alone, marriage is strained     Outpatient Encounter Medications as of 04/24/2019  Medication Sig  . Cholecalciferol (D3-1000) 1000 UNITS tablet Take 2,000 Units by mouth daily.   . Coenzyme Q10 (CO Q 10 PO) Take 1 tablet by mouth daily.   Marland Kitchen levocetirizine (XYZAL) 5 MG tablet Take 5 mg by mouth every evening.  Marland Kitchen lisinopril-hydrochlorothiazide (ZESTORETIC) 20-25 MG tablet Take 1 tablet by mouth once daily  . simvastatin (ZOCOR) 10 MG tablet TAKE 1 TABLET BY MOUTH ONCE DAILY AT BEDTIME *STOP ZETIA*  . UNABLE TO FIND Bilateral compression hose x 1 pair 15-20 mm/hg   No facility-administered encounter medications on file as of 04/24/2019.     Activities of Daily Living In your present state of health, do you have any difficulty performing the following activities: 04/24/2019  Hearing? N  Vision? N  Difficulty concentrating or making decisions? N  Walking or climbing stairs? N  Dressing or bathing? N  Doing errands, shopping? N  Preparing Food and eating ? N  Using the Toilet? N  In the past six months, have you accidently leaked urine? N  Do you have problems with loss of bowel control? N  Managing your Medications? N  Managing your Finances? N  Housekeeping or managing your Housekeeping? N  Some recent data might be hidden    Patient Care Team: Fayrene Helper, MD as PCP - General Herminio Commons, MD as PCP - Cardiology (Cardiology) Gala Romney Cristopher Estimable, MD as Consulting Physician (Gastroenterology)    Assessment:   This is a routine wellness examination for  Karen [name].  Exercise Activities and Dietary recommendations Current Exercise Habits: The patient does not participate in regular exercise at present, Exercise limited by: None identified  Goals    . Exercise 3x per week (30 min per time)     Recommend you increase your exercise program at least 3 days a week for 30-45 minutes at a time as tolerated.      . Weight (lb) < 200 lb (90.7 kg)       Fall Risk Fall Risk  04/24/2019 12/04/2018 04/22/2018 02/28/2018 02/13/2018  Falls in the past year? 0 0 0 No No  Number falls in past yr: 0  0 - - -  Injury with Fall? 0 0 - - -  Risk for fall due to : - - - - -  Risk for fall due to: Comment - - - - -   Is the patient's home free of loose throw rugs in walkways, pet beds, electrical cords, etc?   yes      Grab bars in the bathroom? no      Handrails on the stairs?   yes      Adequate lighting?   yes    Depression Screen PHQ 2/9 Scores 04/24/2019 12/04/2018 12/04/2018 04/22/2018  PHQ - 2 Score 1 0 0 0  PHQ- 9 Score - - - -     Cognitive Function     6CIT Screen 04/24/2019 04/22/2018 04/11/2017  What Year? 0 points 0 points 0 points  What month? 0 points 0 points 0 points  What time? 0 points 3 points 0 points  Count back from 20 0 points 0 points 0 points  Months in reverse 0 points 0 points 0 points  Repeat phrase 0 points 0 points 0 points  Total Score 0 3 0    Immunization History  Administered Date(s) Administered  . Fluad Quad(high Dose 65+) 02/11/2019  . H1N1 04/17/2008  . Influenza Whole 03/29/2006, 07/30/2008, 03/26/2009  . Influenza,inj,Quad PF,6+ Mos 02/20/2013, 03/23/2014, 04/07/2015, 03/21/2016, 02/13/2017, 02/14/2018  . Pneumococcal Conjugate-13 07/16/2014  . Pneumococcal Polysaccharide-23 02/20/2013  . Td 04/27/2004  . Varicella 02/22/2013    Qualifies for Shingles Vaccine? Dec/Jan   Screening Tests Health Maintenance  Topic Date Due  . TETANUS/TDAP  04/27/2014  . COLONOSCOPY  05/24/2020  . MAMMOGRAM   10/24/2020  . INFLUENZA VACCINE  Completed  . DEXA SCAN  Completed  . Hepatitis C Screening  Completed  . PNA vac Low Risk Adult  Completed    Cancer Screenings: Lung: Low Dose CT Chest recommended if Age 3-80 years, 30 pack-year currently smoking OR have quit w/in 15years. Patient does not qualify. Breast:  Up to date on Mammogram? Yes   Up to date of Bone Density/Dexa? Yes  Colorectal:  Due 2021  Additional Screenings:   Hepatitis C Screening: Completed     Plan:         1. Encounter for Medicare annual wellness exam  I have personally reviewed and noted the following in the patient's chart:   . Medical and social history . Use of alcohol, tobacco or illicit drugs  . Current medications and supplements . Functional ability and status . Nutritional status . Physical activity . Advanced directives . List of other physicians . Hospitalizations, surgeries, and ER visits in previous 12 months . Vitals . Screenings to include cognitive, depression, and falls . Referrals and appointments  In addition, I have reviewed and discussed with patient certain preventive protocols, quality metrics, and best practice recommendations. A written personalized care plan for preventive services as well as general preventive health recommendations were provided to patient.     I provided 20 minutes of non-face-to-face time during this encounter.    Perlie Mayo, NP  04/24/2019

## 2019-05-01 ENCOUNTER — Other Ambulatory Visit: Payer: Self-pay

## 2019-05-01 ENCOUNTER — Encounter: Payer: Self-pay | Admitting: Family Medicine

## 2019-05-01 ENCOUNTER — Ambulatory Visit (INDEPENDENT_AMBULATORY_CARE_PROVIDER_SITE_OTHER): Payer: Medicare PPO | Admitting: Family Medicine

## 2019-05-01 VITALS — BP 117/68 | Ht 66.0 in | Wt 203.0 lb

## 2019-05-01 DIAGNOSIS — I10 Essential (primary) hypertension: Secondary | ICD-10-CM | POA: Diagnosis not present

## 2019-05-01 DIAGNOSIS — M25562 Pain in left knee: Secondary | ICD-10-CM

## 2019-05-01 NOTE — Progress Notes (Signed)
Virtual Visit via Telephone Note  I connected with Karen [name] on 05/01/19 at  8:00 AM EST by telephone and verified that I am speaking with the correct person using two identifiers.  Location: Patient: home Provider: office   I discussed the limitations, risks, security and privacy concerns of performing an evaluation and management service by telephone and the availability of in person appointments. I also discussed with the patient that there may be a patient responsible charge related to this service. The patient expressed understanding and agreed to proceed.   History of Present Illness: F/U chronic problem and re evaluate following Covid diagnosis. New left knee pain behind the knee,  Mild fatigue, never had a fever , and still has no feverC/o fatigue following Covid diagnosis on 04/09/2019, self quarantined for approx 14 plus 10 days C/o cramping in knee and swelling states pain is up to a 7, denies calf swelling , pain is in the bend, had no problems with this knee prior to Covid 19   Observations/Objective: BP 117/68   Ht 5\' 6"  (1.676 m)   Wt 203 lb (92.1 kg)   BMI 32.77 kg/m  Good communication with no confusion and intact memory. Alert and oriented x 3 No signs of respiratory distress during speech    Assessment and Plan: Essential hypertension, benign Controlled, no change in medication   Posterior left knee pain New posterior left knee pin following recent diagnosis of covid 19, refer to Orthopedics for evaluation and management    Follow Up Instructions:    I discussed the assessment and treatment plan with the patient. The patient was provided an opportunity to ask questions and all were answered. The patient agreed with the plan and demonstrated an understanding of the instructions.   The patient was advised to call back or seek an in-person evaluation if the symptoms worsen or if the condition fails to improve as anticipated.  I provided 25 minutes of  non-face-to-face time during this encounter.   [name], MD

## 2019-05-01 NOTE — Patient Instructions (Addendum)
Annual physical exam , in office with MD June 2021, call if you need me sooner  Thankful you feel much better, continue to increase activity as tolerated  You are referred to dr Aline Brochure re new left knee pain and swelling following recent Covid infection , his office will call you with appt  Please get fasting labs tomorrow as discussed  Thanks for choosing [facility], we consider it a privelige to serve you.

## 2019-05-03 ENCOUNTER — Encounter: Payer: Self-pay | Admitting: Family Medicine

## 2019-05-03 DIAGNOSIS — M25562 Pain in left knee: Secondary | ICD-10-CM | POA: Insufficient documentation

## 2019-05-03 NOTE — Assessment & Plan Note (Signed)
New posterior left knee pin following recent diagnosis of covid 19, refer to Orthopedics for evaluation and management

## 2019-05-03 NOTE — Assessment & Plan Note (Signed)
Controlled, no change in medication  

## 2019-05-05 DIAGNOSIS — E785 Hyperlipidemia, unspecified: Secondary | ICD-10-CM | POA: Diagnosis not present

## 2019-05-05 DIAGNOSIS — I10 Essential (primary) hypertension: Secondary | ICD-10-CM | POA: Diagnosis not present

## 2019-05-05 DIAGNOSIS — E8881 Metabolic syndrome: Secondary | ICD-10-CM | POA: Diagnosis not present

## 2019-05-05 DIAGNOSIS — R7301 Impaired fasting glucose: Secondary | ICD-10-CM | POA: Diagnosis not present

## 2019-05-06 ENCOUNTER — Encounter: Payer: Self-pay | Admitting: Family Medicine

## 2019-05-06 LAB — COMPLETE METABOLIC PANEL WITH GFR
AG Ratio: 1.4 (calc) (ref 1.0–2.5)
ALT: 29 U/L (ref 6–29)
AST: 22 U/L (ref 10–35)
Albumin: 4.2 g/dL (ref 3.6–5.1)
Alkaline phosphatase (APISO): 59 U/L (ref 37–153)
BUN: 16 mg/dL (ref 7–25)
CO2: 29 mmol/L (ref 20–32)
Calcium: 10.5 mg/dL — ABNORMAL HIGH (ref 8.6–10.4)
Chloride: 105 mmol/L (ref 98–110)
Creat: 0.73 mg/dL (ref 0.60–0.93)
GFR, Est African American: 96 mL/min/{1.73_m2} (ref >=60)
GFR, Est Non African American: 83 mL/min/{1.73_m2} (ref >=60)
Globulin: 3 g/dL (calc) (ref 1.9–3.7)
Glucose, Bld: 97 mg/dL (ref 65–99)
Potassium: 5.1 mmol/L (ref 3.5–5.3)
Sodium: 142 mmol/L (ref 135–146)
Total Bilirubin: 0.6 mg/dL (ref 0.2–1.2)
Total Protein: 7.2 g/dL (ref 6.1–8.1)

## 2019-05-06 LAB — LIPID PANEL
Cholesterol: 199 mg/dL (ref <200)
HDL: 54 mg/dL (ref >=50)
LDL Cholesterol (Calc): 129 mg/dL (calc) — ABNORMAL HIGH
Non-HDL Cholesterol (Calc): 145 mg/dL (calc) — ABNORMAL HIGH (ref <130)
Total CHOL/HDL Ratio: 3.7 (calc) (ref <5.0)
Triglycerides: 69 mg/dL (ref <150)

## 2019-05-06 LAB — HEMOGLOBIN A1C
Hgb A1c MFr Bld: 5.6 % of total Hgb (ref <5.7)
Mean Plasma Glucose: 114 (calc)
eAG (mmol/L): 6.3 (calc)

## 2019-05-09 ENCOUNTER — Ambulatory Visit: Payer: Medicare HMO

## 2019-05-26 ENCOUNTER — Other Ambulatory Visit: Payer: Self-pay

## 2019-05-26 ENCOUNTER — Encounter: Payer: Self-pay | Admitting: Orthopedic Surgery

## 2019-05-26 ENCOUNTER — Ambulatory Visit: Payer: Medicare PPO

## 2019-05-26 ENCOUNTER — Ambulatory Visit (INDEPENDENT_AMBULATORY_CARE_PROVIDER_SITE_OTHER): Payer: Medicare PPO | Admitting: Orthopedic Surgery

## 2019-05-26 VITALS — BP 151/75 | HR 97 | Ht 66.0 in | Wt 200.0 lb

## 2019-05-26 DIAGNOSIS — M25562 Pain in left knee: Secondary | ICD-10-CM | POA: Diagnosis not present

## 2019-05-26 DIAGNOSIS — M23322 Other meniscus derangements, posterior horn of medial meniscus, left knee: Secondary | ICD-10-CM

## 2019-05-26 DIAGNOSIS — G8929 Other chronic pain: Secondary | ICD-10-CM | POA: Diagnosis not present

## 2019-05-26 NOTE — Progress Notes (Signed)
Karen [name]  05/26/2019  Body mass index is 32.28 kg/m.   HISTORY SECTION :  Chief Complaint  Patient presents with  . Knee Pain    left    HPI The patient presents for evaluation of  (mild/moderate/severe/ ) severe pain in the medial joint of the left knee since November 2020 without any history of trauma but does report swelling unable to bear weight and decreased range of motion  Prior treatment she has been on ibuprofen since November 16.  She reports no improvement and the knee worsened and she had to get a cane to use to walk  Initial treatment was by the primary care doctor Dr. Moshe Cipro  Review of Systems  Cardiovascular: Positive for leg swelling.  Musculoskeletal: Positive for joint pain and myalgias.  All other systems reviewed and are negative.    has a past medical history of Arthritis, C. difficile colitis (Jul 18, 2013), H/O herpes zoster, Hyperlipidemia, Hypertension, Metabolic syndrome X, Prediabetes (2014), and Prediabetes.   Past Surgical History:  Procedure Laterality Date  . ABDOMINAL HYSTERECTOMY  1983 approx   fibroids, partial  . ANKLE SURGERY    . COLONOSCOPY  04/27/2005   [id number] polyp at the splenic flexure cold biopsied/removed. Mid  descending polyp removed with cold snare technique. The remainder of the of the colon looked normal/ Normal rectum. inflammed adenomatous polyps.  . COLONOSCOPY N/A 05/26/2013   Dr. Rourk:Multiple colonic polyps removed, tubular adenomas. Needs surveillance Dec 2017.   Marland Kitchen COLONOSCOPY N/A 05/24/2017   Procedure: COLONOSCOPY;  Surgeon: Daneil Dolin, MD;  Location: [facility];  Service: Endoscopy;  Laterality: N/A;  1:00 pm  . FRACTURE SURGERY Right mid 1990's   pin put in and waas removed    Body mass index is 32.28 kg/m.   Allergies  Allergen Reactions  . Amlodipine Rash  . Triamterene Other (See Comments)    Leg cramps, and nausea, also max dose had no effect on BP  . Other     NO BLOOD  PRODUCTS  . Penicillins Rash    Has patient had a PCN reaction causing immediate rash, facial/tongue/throat swelling, SOB or lightheadedness with hypotension: Yes Has patient had a PCN reaction causing severe rash involving mucus membranes or skin necrosis: No Has patient had a PCN reaction that required hospitalization: Unknown Has patient had a PCN reaction occurring within the last 10 years: No If all of the above answers are "NO", then may proceed with Cephalosporin use.      Current Outpatient Medications:  .  Ascorbic Acid (VITAMIN C) 1000 MG tablet, Take 1,000 mg by mouth daily., Disp: , Rfl:  .  Cholecalciferol (D3-1000) 1000 UNITS tablet, Take 2,000 Units by mouth daily. , Disp: , Rfl:  .  Coenzyme Q10 (CO Q 10 PO), Take 1 tablet by mouth daily. , Disp: , Rfl:  .  levocetirizine (XYZAL) 5 MG tablet, Take 5 mg by mouth every evening., Disp: , Rfl:  .  lisinopril-hydrochlorothiazide (ZESTORETIC) 20-25 MG tablet, Take 1 tablet by mouth once daily, Disp: 90 tablet, Rfl: 0 .  simvastatin (ZOCOR) 10 MG tablet, TAKE 1 TABLET BY MOUTH ONCE DAILY AT BEDTIME *STOP ZETIA*, Disp: 90 tablet, Rfl: 0 .  UNABLE TO FIND, Bilateral compression hose x 1 pair 15-20 mm/hg, Disp: 1 each, Rfl: 0   PHYSICAL EXAM SECTION: 1) BP (!) 151/75   Pulse 97   Ht 5\' 6"  (1.676 m)   Wt 200 lb (90.7 kg)   BMI  32.28 kg/m   Body mass index is 32.28 kg/m. General appearance: Well-developed well-nourished no gross deformities  2) Cardiovascular normal pulse and perfusion in the lower extremities normal color without edema  3) Neurologically deep tendon reflexes are equal and normal, no sensation loss or deficits no pathologic reflexes  4) Psychological: Awake alert and oriented x3 mood and affect normal  5) Skin no lacerations or ulcerations no nodularity no palpable masses, no erythema or nodularity  6) Musculoskeletal: Left knee medial joint line tenderness small joint effusion decreased range of motion  positive McMurray's sign normal strength in all ligaments were stable   MEDICAL DECISION SECTION:  Encounter Diagnoses  Name Primary?  . Chronic pain of left knee Yes  . Derangement of posterior horn of medial meniscus of left knee     Imaging In-house x-ray was ordered x-ray shows mild arthritis medial compartment with minimal secondary bone changes  Plan:  (Rx., Inj., surg., Frx, MRI/CT, XR:2)  Left knee joint injection MRI left knee Continue ice Continue ibuprofen Continue Tylenol Continue cane  I reviewed the x-rays with the patient as well as a model showing the inner workings of the knee where the meniscus was thought to be torn  The patient will probably need surgery  Procedure note left knee injection   verbal consent was obtained to inject left knee joint  Timeout was completed to confirm the site of injection  The medications used were 40 mg of Depo-Medrol and 1% lidocaine 3 cc  Anesthesia was provided by ethyl chloride and the skin was prepped with alcohol.  After cleaning the skin with alcohol a 20-gauge needle was used to inject the left knee joint. There were no complications. A sterile bandage was applied.    9:20 AM Arther Abbott, MD  05/26/2019

## 2019-05-26 NOTE — Patient Instructions (Addendum)
You have received an injection of steroids into the joint. 15% of patients will have increased pain within the 24 hours postinjection.   This is transient and will go away.   We recommend that you use ice packs on the injection site for 20 minutes every 2 hours and extra strength Tylenol 2 tablets every 8 as needed until the pain resolves.  If you continue to have pain after taking the Tylenol and using the ice please call the office for further instructions.    Meniscus Tear  A meniscus tear is a knee injury that happens when a piece of the meniscus is torn. The meniscus is a thick, rubbery, wedge-shaped cartilage in the knee. Two menisci are located in each knee. They sit between the upper bone (femur) and lower bone (tibia) that make up the knee joint. Each meniscus acts as a shock absorber for the knee. A torn meniscus is one of the most common types of knee injuries. This injury can range from mild to severe. Surgery may be needed to repair a severe tear. What are the causes? This condition may be caused by any kneeling, squatting, twisting, or pivoting movement. Sports-related injuries are the most common cause. These often occur from:  Running and stopping suddenly. ? Changing direction. ? Being tackled or knocked off your feet.  Lifting or carrying heavy weights. As people get older, their menisci get thinner and weaker. In these people, tears can happen more easily, such as from climbing stairs. What increases the risk? You are more likely to develop this condition if you:  Play contact sports.  Have a job that requires kneeling or squatting.  Are female.  Are over 78 years old. What are the signs or symptoms? Symptoms of this condition include:  Knee pain, especially at the side of the knee joint. You may feel pain when the injury occurs, or you may only hear a pop and feel pain later.  A feeling that your knee is clicking, catching, locking, or giving way.  Not being  able to fully bend or extend your knee.  Bruising or swelling in your knee. How is this diagnosed? This condition may be diagnosed based on your symptoms and a physical exam. You may also have tests, such as:  X-rays.  MRI.  A procedure to look inside your knee with a narrow surgical telescope (arthroscopy). You may be referred to a knee specialist (orthopedic surgeon). How is this treated? Treatment for this injury depends on the severity of the tear. Treatment for a mild tear may include:  Rest.  Medicine to reduce pain and swelling. This is usually a nonsteroidal anti-inflammatory drug (NSAID), like ibuprofen.  A knee brace, sleeve, or wrap.  Using crutches or a walker to keep weight off your knee and to help you walk.  Exercises to strengthen your knee (physical therapy). You may need surgery if you have a severe tear or if other treatments are not working. Follow these instructions at home: If you have a brace, sleeve, or wrap:  Wear it as told by your health care provider. Remove it only as told by your health care provider.  Loosen the brace, sleeve, or wrap if your toes tingle, become numb, or turn cold and blue.  Keep the brace, sleeve, or wrap clean and dry.  If the brace, sleeve, or wrap is not waterproof: ? Do not let it get wet. ? Cover it with a watertight covering when you take a bath or shower.  Managing pain and swelling   Take over-the-counter and prescription medicines only as told by your health care provider.  If directed, put ice on your knee: ? If you have a removable brace, sleeve, or wrap, remove it as told by your health care provider. ? Put ice in a plastic bag. ? Place a towel between your skin and the bag. ? Leave the ice on for 20 minutes, 2-3 times per day.  Move your toes often to avoid stiffness and to lessen swelling.  Raise (elevate) the injured area above the level of your heart while you are sitting or lying down. Activity  Do  not use the injured limb to support your body weight until your health care provider says that you can. Use crutches or a walker as told by your health care provider.  Return to your normal activities as told by your health care provider. Ask your health care provider what activities are safe for you.  Perform range-of-motion exercises only as told by your health care provider.  Begin doing exercises to strengthen your knee and leg muscles only as told by your health care provider. After you recover, your health care provider may recommend these exercises to help prevent another injury. General instructions  Use a knee brace, sleeve, or wrap as told by your health care provider.  Ask your health care provider when it is safe to drive if you have a brace, sleeve, or wrap on your knee.  Do not use any products that contain nicotine or tobacco, such as cigarettes, e-cigarettes, and chewing tobacco. If you need help quitting, ask your health care provider.  Ask your health care provider if the medicine prescribed to you: ? Requires you to avoid driving or using heavy machinery. ? Can cause constipation. You may need to take these actions to prevent or treat constipation:  Drink enough fluid to keep your urine pale yellow.  Take over-the-counter or prescription medicines.  Eat foods that are high in fiber, such as beans, whole grains, and fresh fruits and vegetables.  Limit foods that are high in fat and processed sugars, such as fried or sweet foods.  Keep all follow-up visits as told by your health care provider. This is important. Contact a health care provider if:  You have a fever.  Your knee becomes red, tender, or swollen.  Your pain medicine is not helping.  Your symptoms get worse or do not improve after 2 weeks of home care. Summary  A meniscus tear is a knee injury that happens when a piece of the meniscus is torn.  Treatment for this injury depends on the severity of  the tear. You may need surgery if you have a severe tear or if other treatments are not working.  Rest, ice, and raise (elevate) your injured knee as told by your health care provider. This will help lessen pain and swelling.  Contact a health care provider if you have new symptoms, or your symptoms get worse or do not improve after 2 weeks of home care.  Keep all follow-up visits as told by your health care provider. This is important. This information is not intended to replace advice given to you by your health care provider. Make sure you discuss any questions you have with your health care provider. Document Released: 08/19/2002 Document Revised: 12/11/2017 Document Reviewed: 12/11/2017 Elsevier Patient Education  2020 [place] and tylenol

## 2019-05-26 NOTE — Addendum Note (Signed)
Addended byCandice Camp on: 05/26/2019 09:29 AM   Modules accepted: Orders

## 2019-05-28 ENCOUNTER — Other Ambulatory Visit: Payer: Self-pay | Admitting: Orthopedic Surgery

## 2019-05-28 ENCOUNTER — Telehealth: Payer: Self-pay | Admitting: Orthopedic Surgery

## 2019-05-28 DIAGNOSIS — M23322 Other meniscus derangements, posterior horn of medial meniscus, left knee: Secondary | ICD-10-CM

## 2019-05-28 MED ORDER — MELOXICAM 7.5 MG PO TABS: 7.5000 mg | ORAL_TABLET | Freq: Every day | ORAL | 5 refills | Status: DC

## 2019-05-28 NOTE — Telephone Encounter (Signed)
Call pharmacy in 1 hour they will have it

## 2019-05-28 NOTE — Telephone Encounter (Signed)
Patient called following appointment yesterday, 05/27/19; asking if medication can be prescribed - said daughter mentioned Meloxicam; please advise. Patient's pharmacy is [facility] in [place], if so.

## 2019-05-28 NOTE — Telephone Encounter (Signed)
Notified patient per Dr Ruthe Mannan note.

## 2019-05-28 NOTE — Progress Notes (Signed)
Meds ordered this encounter  Medications   meloxicam (MOBIC) 7.5 MG tablet    Sig: Take 1 tablet (7.5 mg total) by mouth daily.    Dispense:  30 tablet    Refill:  5    

## 2019-06-03 ENCOUNTER — Other Ambulatory Visit: Payer: Self-pay

## 2019-06-03 ENCOUNTER — Ambulatory Visit (HOSPITAL_COMMUNITY)
Admission: RE | Admit: 2019-06-03 | Discharge: 2019-06-03 | Disposition: A | Discharge status: Home / Self Care | Payer: Medicare PPO | Source: Ambulatory Visit | Attending: Orthopedic Surgery | Admitting: Orthopedic Surgery

## 2019-06-03 ENCOUNTER — Telehealth: Payer: Self-pay | Admitting: Orthopedic Surgery

## 2019-06-03 DIAGNOSIS — G8929 Other chronic pain: Secondary | ICD-10-CM | POA: Diagnosis not present

## 2019-06-03 DIAGNOSIS — M23322 Other meniscus derangements, posterior horn of medial meniscus, left knee: Secondary | ICD-10-CM | POA: Insufficient documentation

## 2019-06-03 DIAGNOSIS — M25562 Pain in left knee: Secondary | ICD-10-CM | POA: Diagnosis not present

## 2019-06-03 IMAGING — MR MR KNEE*L* W/O CM
4 of 7 series · 14 of 40 positions shown · non-contrast
Comparison: Radiographs dated [DATE]

CLINICAL DATA: Chronic left knee pain and weakness.

EXAM:
MRI OF THE LEFT KNEE WITHOUT CONTRAST
TECHNIQUE: Multiplanar, multisequence MR imaging of the knee was performed. No
intravenous contrast was administered.

[Series 3: T2 fat-sat · axial · 4.0mm · 0.25mm/px · z∈[-47,+48]mm · 3 of 24 slices shown]
[im 5/24]
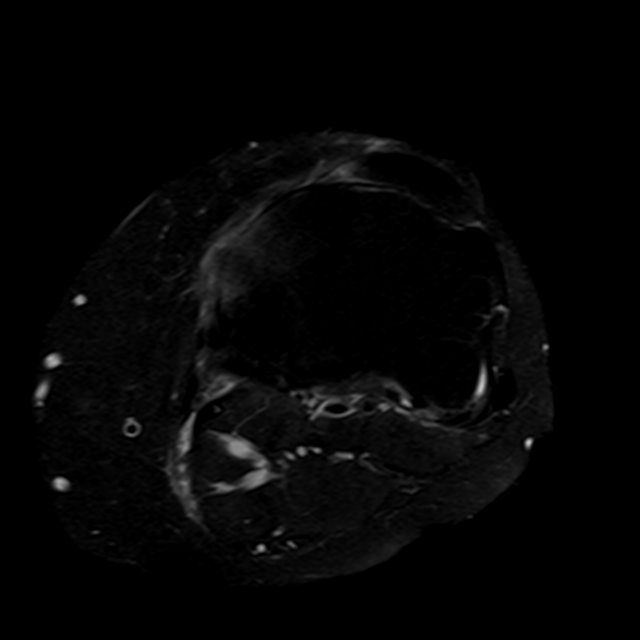
[im 14/24]
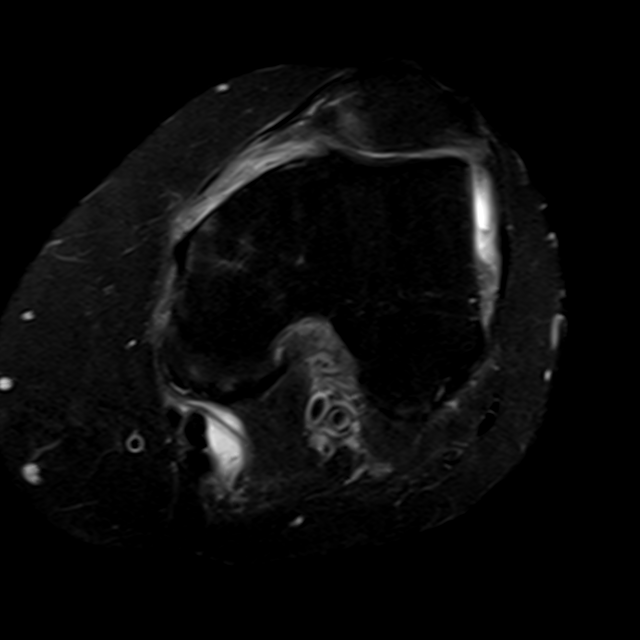
[im 24/24]
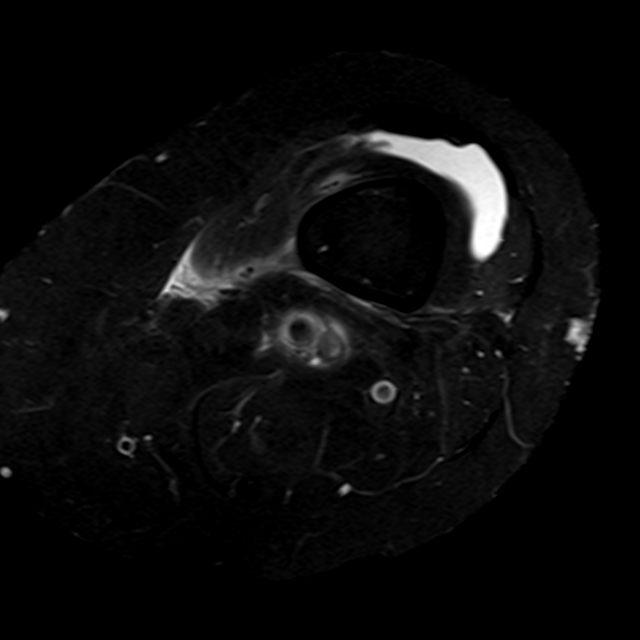

[Series 6: PD fat-sat · coronal · 3.0mm · 0.25mm/px · 5 of 36 slices shown (1 of 3)]
[im 1/36]
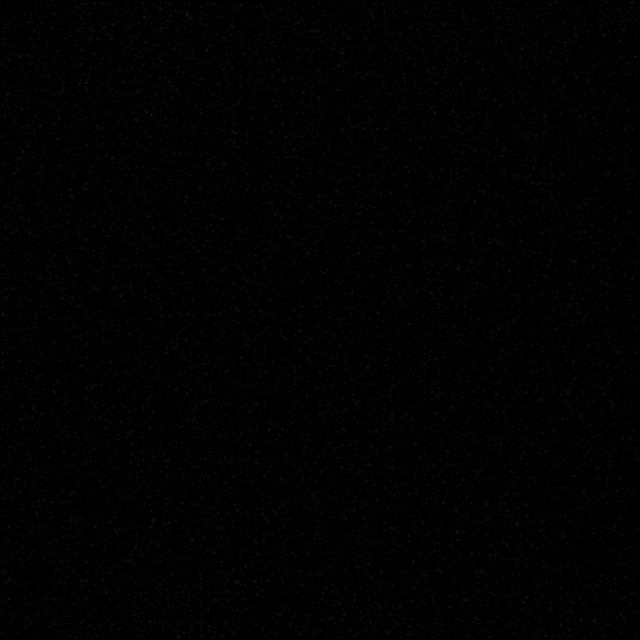
[im 6/36]
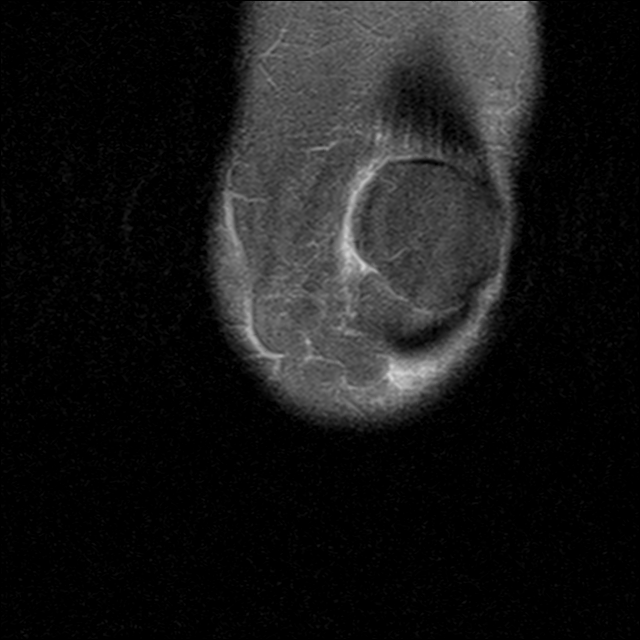
[im 12/36]
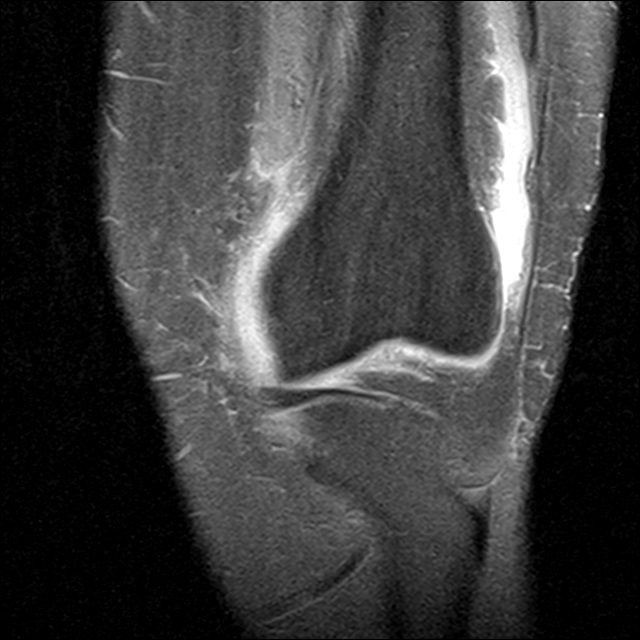
[im 18/36]
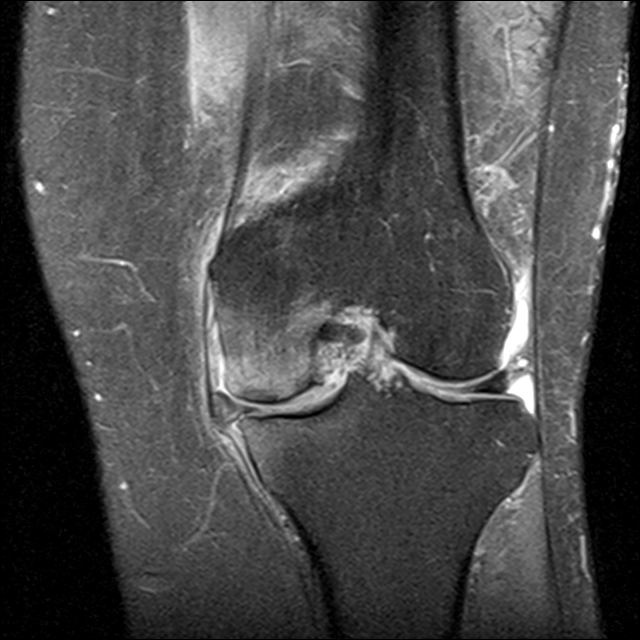
[im 30/36]
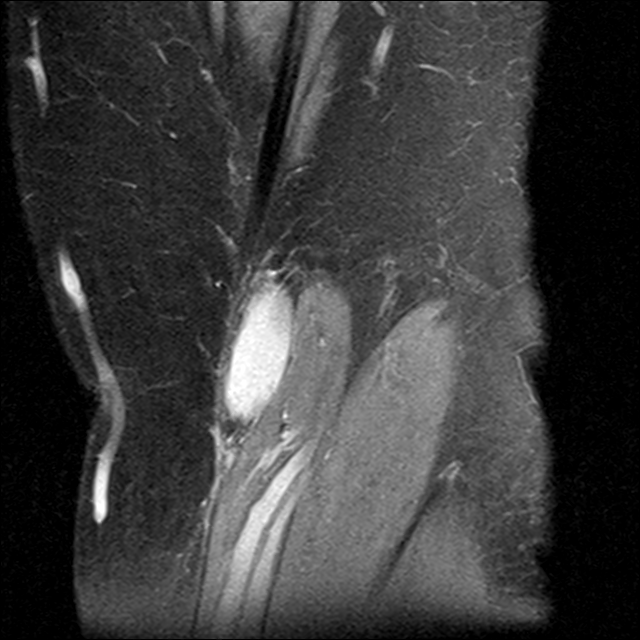

[Series 7: PD fat-sat · sagittal · 3.0mm · 0.22mm/px · 3 of 29 slices shown (2 of 3)]
[im 6/29]
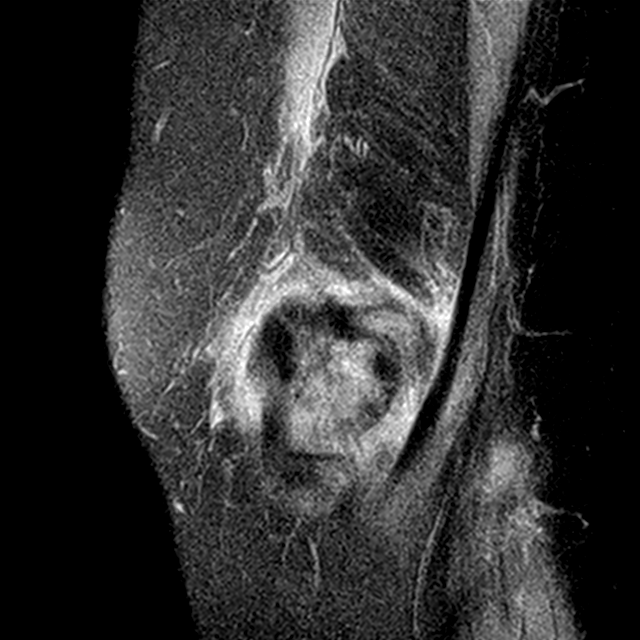
[im 17/29]
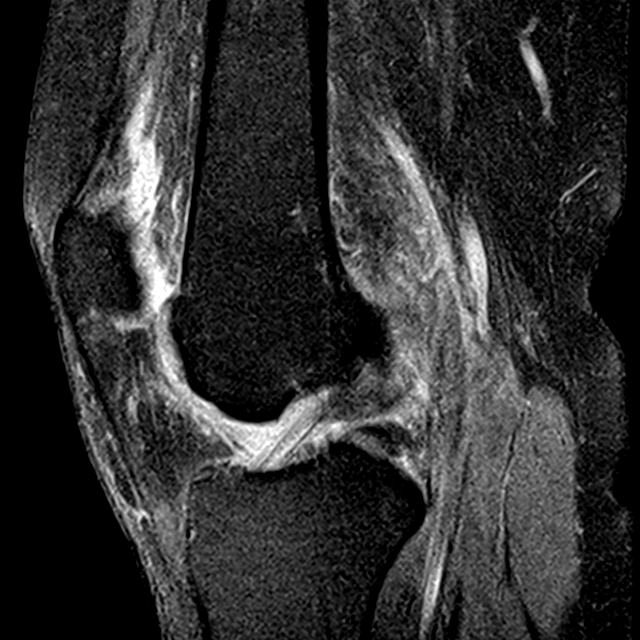
[im 29/29]
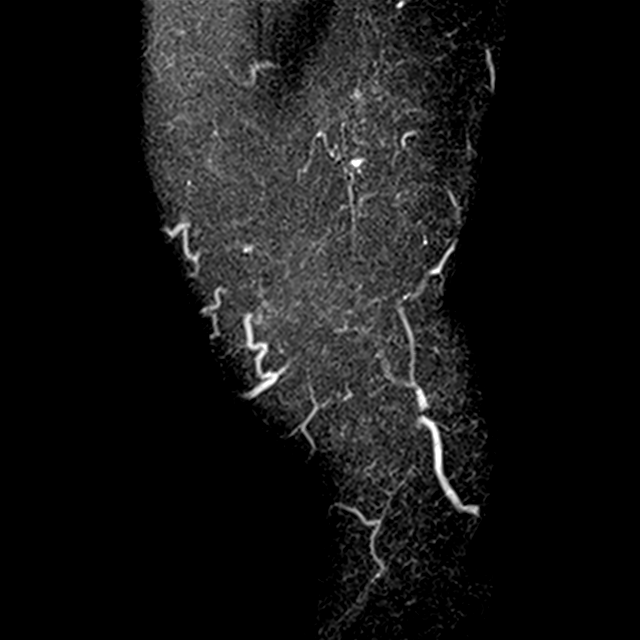

[Series 9: PD fat-sat · oblique · 2.0mm · 0.24mm/px · 3 of 15 slices shown (3 of 3)]
[im 1/15]
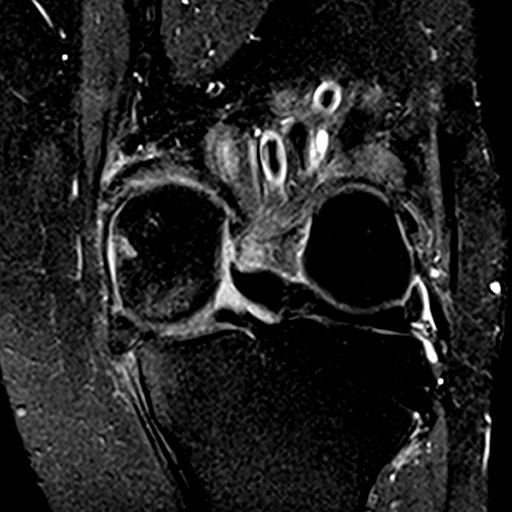
[im 8/15]
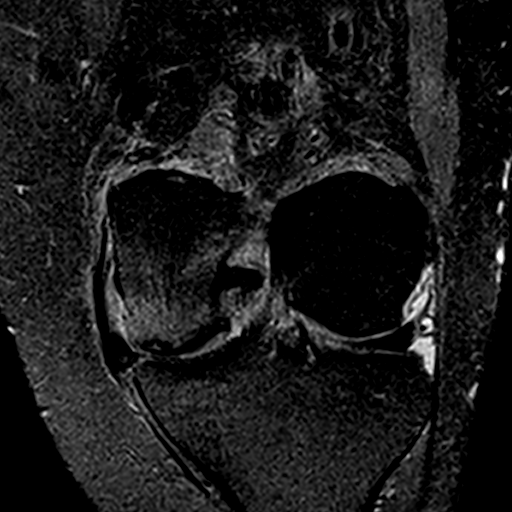
[im 15/15]
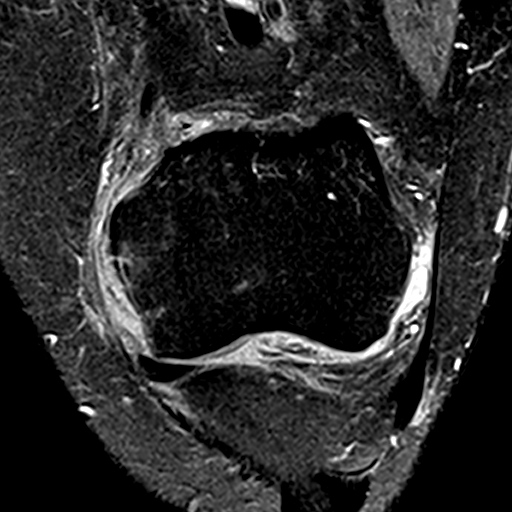

[14 of 40 positions shown; findings below may reference images not displayed]

FINDINGS: MENISCI

Medial meniscus: There is extensive complex tear of the posterior
horn. Portion of the torn meniscus is flipped above the meniscal
root. The meniscus is peripherally extruded secondary to the tear
and the medial joint space narrowing.

Lateral meniscus:  Intact.

LIGAMENTS

Cruciates:  Intact.

Collaterals:  Intact.

CARTILAGE

Patellofemoral: Focal thinning of the cartilage of the central
portion of the trochlear groove of the distal femur.

Medial:  Diffuse thinning of the articular cartilage.

Lateral:  Normal.

Joint: Moderate joint effusion. There is edema superficial and deep
to the medial patellofemoral ligament and around the medial
collateral ligament which I suspect is due to the medial compartment
derangement.

Popliteal Fossa: 6 x 2 x 1.5 cm Baker's cyst with some leakage
extending distally adjacent to the medial head of the gastrocnemius.

Extensor Mechanism: Distal quadriceps tendon and patellar tendon are
normal.

Bones: There is an insufficiency fracture of the central portion of
the medial femoral condyle, likely due to the meniscal tear and
secondary meniscal extrusion. There is secondary prominent edema in
the medial femoral condyle due to the insufficiency fracture. There
is also edema in the adjacent tibial plateau.

Other: Edema extends around the distal vastus medialis muscle,
likely due to the medial compartment derangement
IMPRESSION: 1. Subchondral insufficiency fracture of the central portion of the
medial femoral condyle.
2. Complex tear of the posterior horn of the medial meniscus with
secondary meniscal extrusion.
3. Moderate joint effusion with leaking Baker's cyst.

## 2019-06-03 NOTE — Telephone Encounter (Signed)
Karen [name] had an MRI it shows she has a torn meniscus and insufficiency fracture medial femoral condyle of the femur however  She is asymptomatic she said her knee feels better after the shot and the pain medication  We discussed this I advised her to hold off on surgery until she is symptomatic she agreed we will talk to her if symptoms warrant

## 2019-06-04 ENCOUNTER — Other Ambulatory Visit: Payer: Self-pay | Admitting: Family Medicine

## 2019-07-11 ENCOUNTER — Other Ambulatory Visit: Payer: Self-pay | Admitting: Family Medicine

## 2019-07-17 ENCOUNTER — Other Ambulatory Visit: Payer: Self-pay

## 2019-07-17 ENCOUNTER — Ambulatory Visit: Payer: Medicare Other | Attending: Internal Medicine

## 2019-07-17 DIAGNOSIS — Z23 Encounter for immunization: Secondary | ICD-10-CM

## 2019-07-17 NOTE — Progress Notes (Signed)
   Covid-19 Vaccination Clinic  Name:  Karen [name]    MRN: [id number] DOB: 1945-10-09  07/17/2019  [name] was observed post Covid-19 immunization for 15 minutes without incidence. She was provided with Vaccine Information Sheet and instruction to access the V-Safe system.   [name] was instructed to call 911 with any severe reactions post vaccine: Marland Kitchen Difficulty breathing  . Swelling of your face and throat  . A fast heartbeat  . A bad rash all over your body  . Dizziness and weakness    Immunizations Administered    Name Date Dose VIS Date Route   Moderna COVID-19 Vaccine 07/17/2019 10:31 AM 0.5 mL 05/13/2019 Intramuscular   Manufacturer: Moderna   Lot: [id number]   [id number]

## 2019-08-14 ENCOUNTER — Ambulatory Visit: Payer: Medicare Other

## 2019-08-18 ENCOUNTER — Ambulatory Visit: Payer: Medicare Other | Attending: Internal Medicine

## 2019-08-18 DIAGNOSIS — Z23 Encounter for immunization: Secondary | ICD-10-CM | POA: Insufficient documentation

## 2019-08-18 NOTE — Progress Notes (Signed)
   Covid-19 Vaccination Clinic  Name:  Karen [name]    MRN: [id number] DOB: 1946/06/06  08/18/2019  [name] was observed post Covid-19 immunization for 15 minutes without incident. She was provided with Vaccine Information Sheet and instruction to access the V-Safe system.   [name] was instructed to call 911 with any severe reactions post vaccine: Marland Kitchen Difficulty breathing  . Swelling of face and throat  . A fast heartbeat  . A bad rash all over body  . Dizziness and weakness   Immunizations Administered    Name Date Dose VIS Date Route   Moderna COVID-19 Vaccine 08/18/2019 10:24 AM 0.5 mL 05/13/2019 Intramuscular   Manufacturer: Moderna   Lot: [id number]   [id number]

## 2019-09-02 ENCOUNTER — Other Ambulatory Visit: Payer: Self-pay | Admitting: Family Medicine

## 2019-11-05 ENCOUNTER — Ambulatory Visit (INDEPENDENT_AMBULATORY_CARE_PROVIDER_SITE_OTHER): Payer: Medicare Other | Admitting: Family Medicine

## 2019-11-05 ENCOUNTER — Other Ambulatory Visit: Payer: Self-pay

## 2019-11-05 ENCOUNTER — Encounter: Payer: Self-pay | Admitting: Family Medicine

## 2019-11-05 DIAGNOSIS — E785 Hyperlipidemia, unspecified: Secondary | ICD-10-CM | POA: Diagnosis not present

## 2019-11-05 DIAGNOSIS — E042 Nontoxic multinodular goiter: Secondary | ICD-10-CM

## 2019-11-05 DIAGNOSIS — R7302 Impaired glucose tolerance (oral): Secondary | ICD-10-CM

## 2019-11-05 DIAGNOSIS — E559 Vitamin D deficiency, unspecified: Secondary | ICD-10-CM | POA: Diagnosis not present

## 2019-11-05 DIAGNOSIS — I10 Essential (primary) hypertension: Secondary | ICD-10-CM | POA: Diagnosis not present

## 2019-11-05 NOTE — Progress Notes (Signed)
Health Maintenance reviewed -  Immunization History  Administered Date(s) Administered  . Fluad Quad(high Dose 65+) 02/11/2019  . H1N1 04/17/2008  . Influenza Whole 03/29/2006, 07/30/2008, 03/26/2009  . Influenza,inj,Quad PF,6+ Mos 02/20/2013, 03/23/2014, 04/07/2015, 03/21/2016, 02/13/2017, 02/14/2018  . Moderna SARS-COVID-2 Vaccination 07/17/2019, 08/18/2019  . Pneumococcal Conjugate-13 07/16/2014  . Pneumococcal Polysaccharide-23 02/20/2013  . Td 04/27/2004  . Varicella 02/22/2013   Last Pap smear: n/a  Last mammogram: calling today  Last colonoscopy: 05/2020 Last DEXA: completed Dentist: been over 2 years; cap and bridge  Ophtho: Due  now Exercise: home health-sitting, limited on time, trying to walk more   Other doctors caring for patient include:  Patient Care Team: Fayrene Helper, MD as PCP - General Herminio Commons, MD as PCP - Cardiology (Cardiology) Gala Romney Cristopher Estimable, MD as Consulting Physician (Gastroenterology)  End of Life Discussion:  Patient does not have a living will and medical power of attorney. Reports having the papers at home.    Code Status: Prior   Subjective:   HPI  Karen [name] is a 74 y.o. female who presents  follow-up on chronic medical conditions.   She has the following concerns: Noted some fullness in the right side of her stomach, there is no pain no radiation of issue.  She just reports "it looks bigger and fuller".  Over the liver area. No Bowel or bladder changes, No blood seen in stool or urine. Denies vision changes, eye glasses  Denies trouble eating chewing or swallowing Drinks plenty of water. Denies sleeping trouble. Some memory trouble, forgetfulness increased since covid Denies falls. Overall she reports that she is feeling well.   Review Of Systems  Review of Systems  Constitutional: Negative.   HENT: Negative.   Eyes: Negative.   Respiratory: Negative.   Cardiovascular: Negative.     Gastrointestinal: Negative.   Endocrine: Negative.   Genitourinary: Negative.   Musculoskeletal: Negative.   Skin: Negative.   Allergic/Immunologic: Negative.   Neurological: Negative.   Hematological: Negative.   Psychiatric/Behavioral: Negative.   All other systems reviewed and are negative.   Objective:   PHYSICAL EXAM:  BP 130/72   Pulse 75   Temp (!) 97.1 F (36.2 C) (Temporal)   Resp 15   Ht 5\' 6"  (1.676 m)   Wt 211 lb (95.7 kg)   SpO2 95%   BMI 34.06 kg/m   Physical Exam Vitals and nursing note reviewed.  Constitutional:      Appearance: Normal appearance. She is well-developed and well-groomed. She is obese.  HENT:     Head: Normocephalic and atraumatic.     Right Ear: Hearing, tympanic membrane, ear canal and external ear normal.     Left Ear: Hearing, tympanic membrane, ear canal and external ear normal.     Ears:     Weber exam findings: does not lateralize.    Right Rinne: AC > BC.    Left Rinne: AC > BC.    Nose: Nose normal.     Mouth/Throat:     Lips: Pink.     Mouth: Mucous membranes are moist.     Pharynx: Oropharynx is clear. Uvula midline.  Eyes:     General: Lids are normal.     Extraocular Movements: Extraocular movements intact.     Conjunctiva/sclera: Conjunctivae normal.     Pupils: Pupils are equal, round, and reactive to light.  Neck:     Thyroid: No thyromegaly.  Cardiovascular:     Rate  and Rhythm: Normal rate and regular rhythm.     Pulses: Normal pulses.          Radial pulses are 2+ on the right side and 2+ on the left side.       Dorsalis pedis pulses are 2+ on the right side and 2+ on the left side.       Posterior tibial pulses are 2+ on the right side and 2+ on the left side.     Heart sounds: Normal heart sounds.  Pulmonary:     Effort: Pulmonary effort is normal.     Breath sounds: Normal breath sounds and air entry.  Abdominal:     General: Abdomen is flat. Bowel sounds are normal.     Palpations: Abdomen is soft.      Tenderness: There is no abdominal tenderness. There is no right CVA tenderness or left CVA tenderness.     Comments: When standing noticed some fuller look to the right upper quadrant however in examination no noted hepatomegaly noted.  Musculoskeletal:        General: Normal range of motion.     Cervical back: Full passive range of motion without pain, normal range of motion and neck supple.     Right lower leg: No edema.     Left lower leg: No edema.     Right foot: Normal range of motion.     Left foot: Normal range of motion.  Feet:     Right foot:     Skin integrity: Skin integrity normal.     Toenail Condition: Right toenails are normal.     Left foot:     Skin integrity: Skin integrity normal.     Toenail Condition: Left toenails are normal.  Lymphadenopathy:     Cervical: No cervical adenopathy.  Skin:    General: Skin is warm and dry.     Capillary Refill: Capillary refill takes less than 2 seconds.  Neurological:     General: No focal deficit present.     Mental Status: She is alert and oriented to person, place, and time. Mental status is at baseline.     Cranial Nerves: Cranial nerves are intact.     Sensory: Sensation is intact.     Motor: Motor function is intact.     Coordination: Coordination is intact.     Gait: Gait is intact.     Deep Tendon Reflexes: Reflexes are normal and symmetric.  Psychiatric:        Attention and Perception: Attention and perception normal.        Mood and Affect: Mood and affect normal.        Speech: Speech normal.        Behavior: Behavior normal. Behavior is cooperative.        Thought Content: Thought content normal.        Cognition and Memory: Cognition and memory normal.        Judgment: Judgment normal.     Comments: Pleasant in conversation good eye contact.     Depression Screening  Depression screen [facility] 2/9 11/05/2019 04/24/2019 12/04/2018 12/04/2018 04/22/2018  Decreased Interest 0 0 0 0 0  Down, Depressed,  Hopeless 0 1 0 0 0  PHQ - 2 Score 0 1 0 0 0  Altered sleeping - - - - -  Tired, decreased energy - - - - -  Change in appetite - - - - -  Feeling bad or failure about yourself  - - - - -  Trouble concentrating - - - - -  Moving slowly or fidgety/restless - - - - -  Suicidal thoughts - - - - -  PHQ-9 Score - - - - -      Assessment & Plan:   1. Morbid obesity ([place])   2. Essential hypertension, benign   3. Goiter, nontoxic, multinodular   4. IGT (impaired glucose tolerance)   5. Hyperlipidemia LDL goal <100   6. Vitamin D deficiency     Tests ordered Orders Placed This Encounter  Procedures  . US THYROID  . CBC  . COMPLETE METABOLIC PANEL WITH GFR  . Hemoglobin A1c  . Lipid panel  . TSH  . VITAMIN D 25 Hydroxy (Vit-D Deficiency, Fractures)     Plan: Please see assessment and plan per problem list above.   No orders of the defined types were placed in this encounter.  I have personally reviewed: The patient's medical and social history Their use of alcohol, tobacco or illicit drugs Their current medications and supplements The patient's functional ability including ADLs,fall risks, home safety risks, cognitive, and hearing and visual impairment Diet and physical activities Evidence for depression or mood disorders  The patient's weight, height, BMI, and visual acuity have been recorded in the chart.  I have made referrals, counseling, and provided education to the patient based on review of the above and I have provided the patient with a written personalized care plan for preventive services.     40 minutes was spent preparing reviewing notes, going over the above plan of care, diagnoses, updated and needed lab work diagnoses and answering questions, as well as physical assessment.  Perlie Mayo, NP   11/07/2019

## 2019-11-05 NOTE — Patient Instructions (Signed)
I appreciate the opportunity to provide you with care for your health and wellness. Today we discussed: overall health   Follow up: 5 months   Labs today- fasting No referrals today  Please continue to practice social distancing to keep you, your family, and our community safe.  If you must go out, please wear a mask and practice good handwashing.  It was a pleasure to see you and I look forward to continuing to work together on your health and well-being. Please do not hesitate to call the office if you need care or have questions about your care.  Have a wonderful day and week. With Gratitude, Cherly Beach, DNP, AGNP-BC  HEALTH MAINTENANCE RECOMMENDATIONS:  It is recommended that you get at least 30 minutes of aerobic exercise at least 5 days/week (for weight loss, you may need as much as 60-90 minutes). This can be any activity that gets your heart rate up. This can be divided in 10-15 minute intervals if needed, but try and build up your endurance at least once a week.  Weight bearing exercise is also recommended twice weekly.  Eat a healthy diet with lots of vegetables, fruits and fiber.  "Colorful" foods have a lot of vitamins (ie green vegetables, tomatoes, red peppers, etc).  Limit sweet tea, regular sodas and alcoholic beverages, all of which has a lot of calories and sugar.  Up to 1 alcoholic drink daily may be beneficial for women (unless trying to lose weight, watch sugars).  Drink a lot of water.  Calcium recommendations are 1200-1500 mg daily (1500 mg for postmenopausal women or women without ovaries), and vitamin D 1000 IU daily.  This should be obtained from diet and/or supplements (vitamins), and calcium should not be taken all at once, but in divided doses.  Monthly self breast exams and yearly mammograms for women over the age of 54 is recommended.  Sunscreen of at least SPF 30 should be used on all sun-exposed parts of the skin when outside between the hours of 10 am  and 4 pm (not just when at beach or pool, but even with exercise, golf, tennis, and yard work!)  Use a sunscreen that says "broad spectrum" so it covers both UVA and UVB rays, and make sure to reapply every 1-2 hours.  Remember to change the batteries in your smoke detectors when changing your clock times in the spring and fall.  Use your seat belt every time you are in a car, and please drive safely and not be distracted with cell phones and texting while driving.

## 2019-11-06 LAB — COMPLETE METABOLIC PANEL WITH GFR
AG Ratio: 1.5 (calc) (ref 1.0–2.5)
ALT: 16 U/L (ref 6–29)
AST: 17 U/L (ref 10–35)
Albumin: 4.1 g/dL (ref 3.6–5.1)
Alkaline phosphatase (APISO): 78 U/L (ref 37–153)
BUN: 15 mg/dL (ref 7–25)
CO2: 30 mmol/L (ref 20–32)
Calcium: 10.1 mg/dL (ref 8.6–10.4)
Chloride: 104 mmol/L (ref 98–110)
Creat: 0.71 mg/dL (ref 0.60–0.93)
GFR, Est African American: 99 mL/min/{1.73_m2} (ref >=60)
GFR, Est Non African American: 85 mL/min/{1.73_m2} (ref >=60)
Globulin: 2.7 g/dL (calc) (ref 1.9–3.7)
Glucose, Bld: 94 mg/dL (ref 65–99)
Potassium: 4.4 mmol/L (ref 3.5–5.3)
Sodium: 139 mmol/L (ref 135–146)
Total Bilirubin: 0.6 mg/dL (ref 0.2–1.2)
Total Protein: 6.8 g/dL (ref 6.1–8.1)

## 2019-11-06 LAB — CBC
HCT: 38.5 % (ref 35.0–45.0)
Hemoglobin: 12.6 g/dL (ref 11.7–15.5)
MCH: 30 pg (ref 27.0–33.0)
MCHC: 32.7 g/dL (ref 32.0–36.0)
MCV: 91.7 fL (ref 80.0–100.0)
MPV: 10.3 fL (ref 7.5–12.5)
Platelets: 316 10*3/uL (ref 140–400)
RBC: 4.2 10*6/uL (ref 3.80–5.10)
RDW: 12.1 % (ref 11.0–15.0)
WBC: 3.7 10*3/uL — ABNORMAL LOW (ref 3.8–10.8)

## 2019-11-06 LAB — LIPID PANEL
Cholesterol: 170 mg/dL (ref <200)
HDL: 50 mg/dL (ref >=50)
LDL Cholesterol (Calc): 105 mg/dL (calc) — ABNORMAL HIGH
Non-HDL Cholesterol (Calc): 120 mg/dL (calc) (ref <130)
Total CHOL/HDL Ratio: 3.4 (calc) (ref <5.0)
Triglycerides: 63 mg/dL (ref <150)

## 2019-11-06 LAB — HEMOGLOBIN A1C
Hgb A1c MFr Bld: 5.4 % of total Hgb (ref <5.7)
Mean Plasma Glucose: 108 (calc)
eAG (mmol/L): 6 (calc)

## 2019-11-06 LAB — TSH: TSH: 0.79 mIU/L (ref 0.40–4.50)

## 2019-11-06 LAB — VITAMIN D 25 HYDROXY (VIT D DEFICIENCY, FRACTURES): Vit D, 25-Hydroxy: 31 ng/mL (ref 30–100)

## 2019-11-07 ENCOUNTER — Encounter: Payer: Self-pay | Admitting: Family Medicine

## 2019-11-07 DIAGNOSIS — E559 Vitamin D deficiency, unspecified: Secondary | ICD-10-CM | POA: Insufficient documentation

## 2019-11-07 NOTE — Assessment & Plan Note (Signed)
Heart healthy diet encouraged.  Updated labs ordered. °

## 2019-11-07 NOTE — Assessment & Plan Note (Signed)
Osteopenic, vitamin D level will be checked.

## 2019-11-07 NOTE — Assessment & Plan Note (Signed)
Previous history of impaired fasting glucose we will get updated labs.  Encouraged a heart healthy low-fat low sugar diet.

## 2019-11-07 NOTE — Assessment & Plan Note (Addendum)
Multiple nodules were seen in 2004, updated imaging ordered.  TSH level ordered.

## 2019-11-07 NOTE — Assessment & Plan Note (Signed)
Karen [name] is encouraged to maintain a well balanced diet that is low in salt. Controlled, continue current medication regimen. Additionally, she is also reminded that exercise is beneficial for heart health and control of  Blood pressure. 30-60 minutes daily is recommended-walking was suggested.

## 2019-11-07 NOTE — Assessment & Plan Note (Signed)
Obesity is linked to hypertension  Deteriorated, Karen [name] is re-educated about the importance of exercise daily to help with weight management. A minumum of 30 minutes daily is recommended. Additionally, importance of healthy food choices  with portion control discussed. Wt Readings from Last 3 Encounters:  11/05/19 211 lb (95.7 kg)  05/26/19 200 lb (90.7 kg)  05/01/19 203 lb (92.1 kg)

## 2019-11-18 ENCOUNTER — Telehealth: Payer: Self-pay

## 2019-11-18 NOTE — Telephone Encounter (Signed)
Pt thought this was a biopsy and does not want to have this done again however she said the ultrasound would be ok she said if she decides to not have this done then she would call and cancel it but appreciated the call back to let her know what it was for

## 2019-11-18 NOTE — Telephone Encounter (Signed)
Per the notes, back in 2015-Dr Moshe Cipro had wanted updated imaging to make sure all was well. She has had some nodes found in the past. She was sick in 2015 at the time of the appt, so she did not want the test at that time. Please let me know what she decides.

## 2019-11-18 NOTE — Telephone Encounter (Signed)
Looks like ultrasound was ordered due to the goiter but the pt is confused as to why she needs this as she doesn't have a goiter and to her understanding her blood levels were better and she didn't need to have the ultrasound. Please advise.

## 2019-11-18 NOTE — Telephone Encounter (Signed)
Please call  The pt to discuss ultrasound that needs to be done

## 2019-11-18 NOTE — Telephone Encounter (Signed)
Noted, thank you for helping her understand this better.

## 2019-11-24 ENCOUNTER — Ambulatory Visit (HOSPITAL_COMMUNITY): Payer: Medicare Other

## 2019-12-01 ENCOUNTER — Other Ambulatory Visit: Payer: Self-pay | Admitting: *Deleted

## 2019-12-01 MED ORDER — LISINOPRIL-HYDROCHLOROTHIAZIDE 20-25 MG PO TABS: 1.0000 | ORAL_TABLET | Freq: Every day | ORAL | 0 refills | Status: DC

## 2019-12-02 ENCOUNTER — Encounter: Payer: Medicare PPO | Admitting: Family Medicine

## 2019-12-08 ENCOUNTER — Other Ambulatory Visit (HOSPITAL_COMMUNITY): Payer: Self-pay | Admitting: Family Medicine

## 2019-12-08 DIAGNOSIS — Z1231 Encounter for screening mammogram for malignant neoplasm of breast: Secondary | ICD-10-CM

## 2019-12-12 ENCOUNTER — Ambulatory Visit (HOSPITAL_COMMUNITY)
Admission: RE | Admit: 2019-12-12 | Discharge: 2019-12-12 | Disposition: A | Discharge status: Home / Self Care | Payer: Medicare Other | Source: Ambulatory Visit | Attending: Family Medicine | Admitting: Family Medicine

## 2019-12-12 ENCOUNTER — Other Ambulatory Visit: Payer: Self-pay

## 2019-12-12 DIAGNOSIS — Z1231 Encounter for screening mammogram for malignant neoplasm of breast: Secondary | ICD-10-CM | POA: Insufficient documentation

## 2019-12-12 IMAGING — MG DIGITAL SCREENING BILAT W/ TOMO W/ CAD
8 series · 8 of 24 positions shown · non-contrast
Comparison: Previous exam(s).

CLINICAL DATA: Screening.

EXAM:
DIGITAL SCREENING BILATERAL MAMMOGRAM WITH TOMO AND CAD

[L MLO synth-2D]
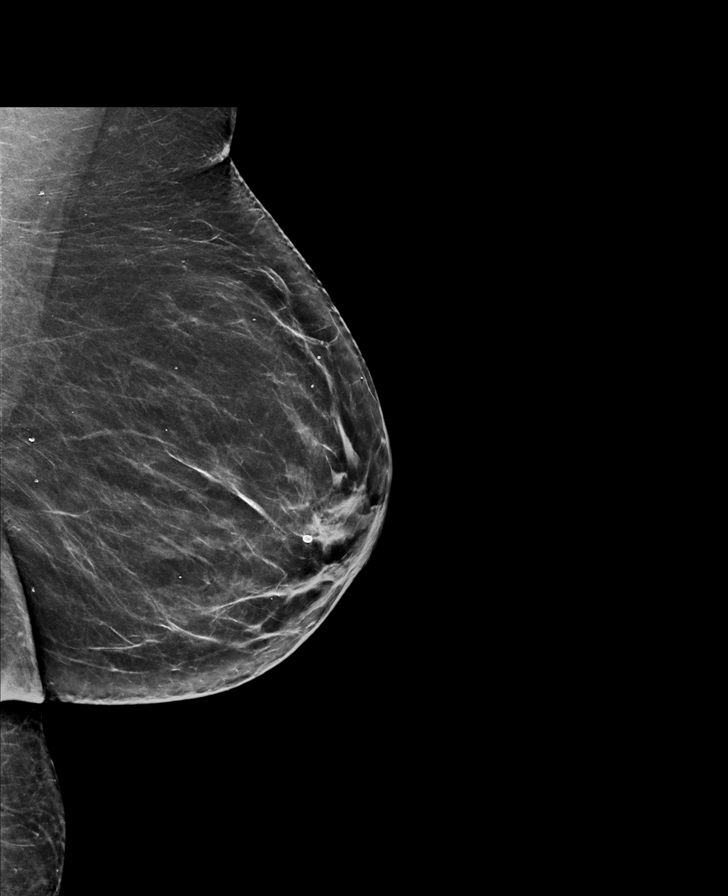

[L CC synth-2D]
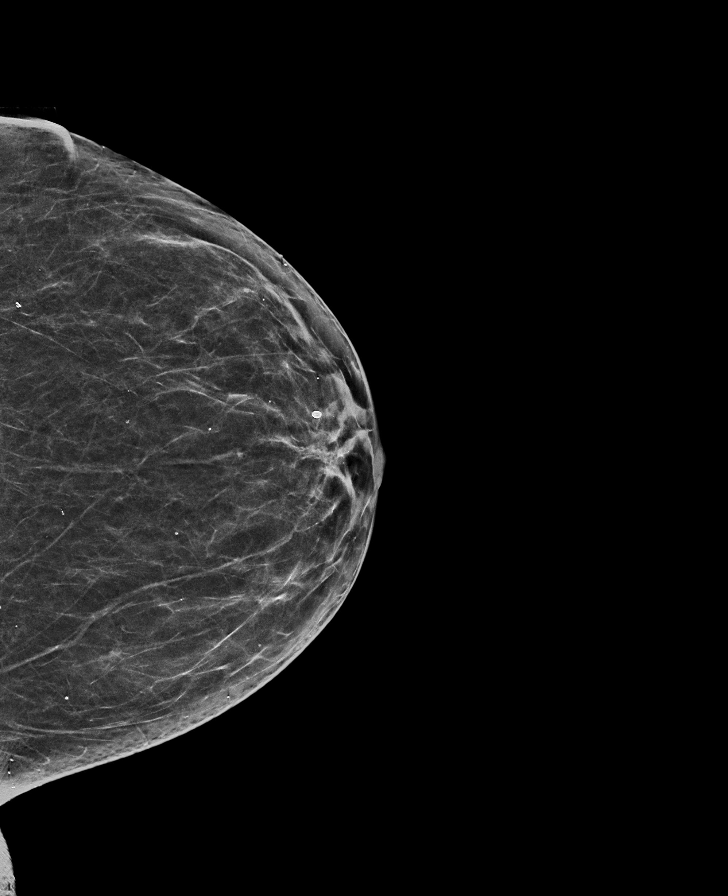

[R MLO synth-2D]
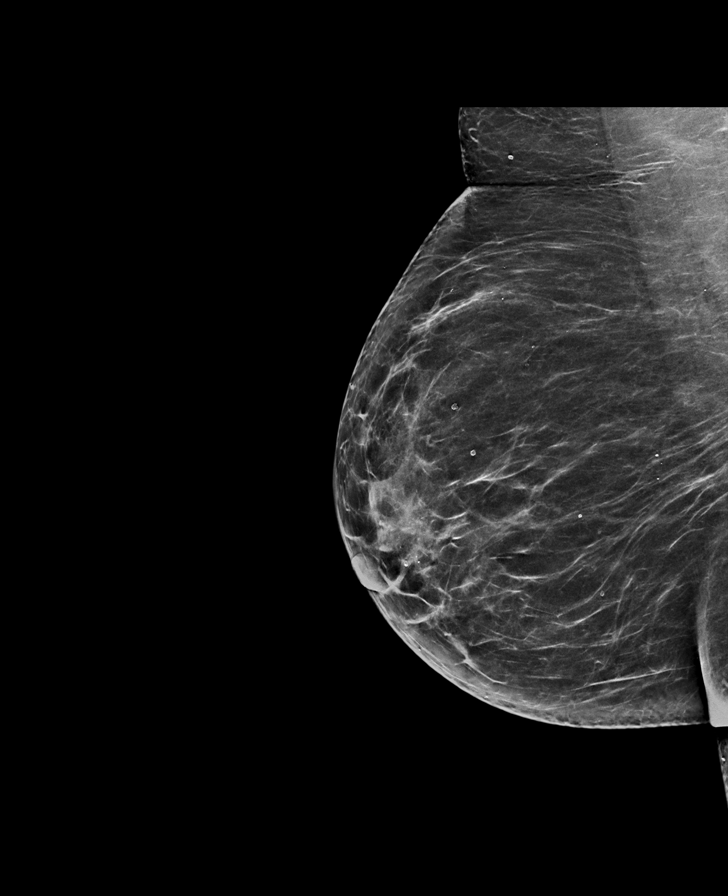

[R CC synth-2D]
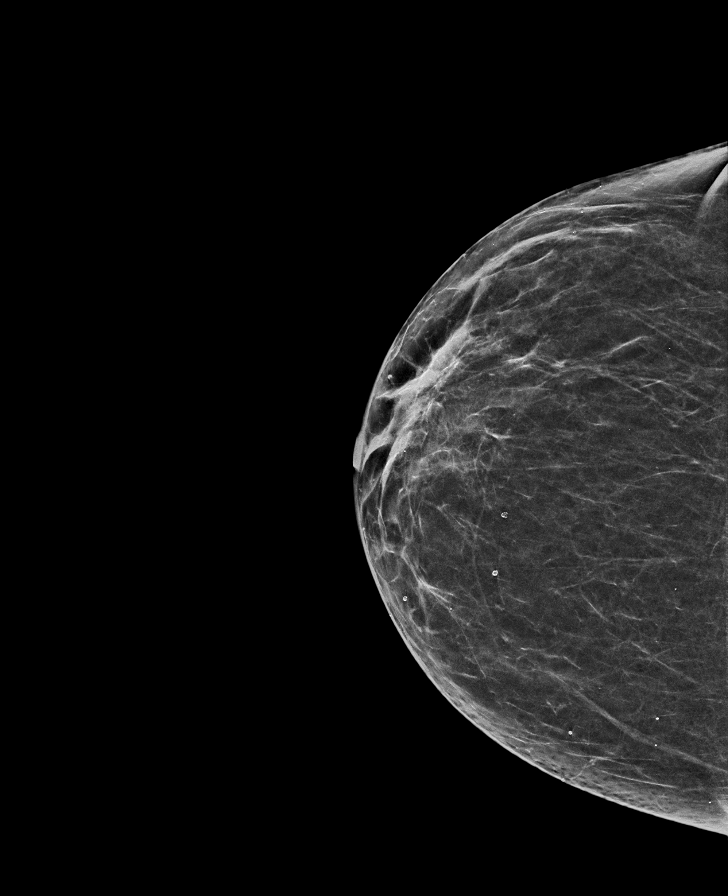

[L MLO tomo · tomo slice 36/71.0]
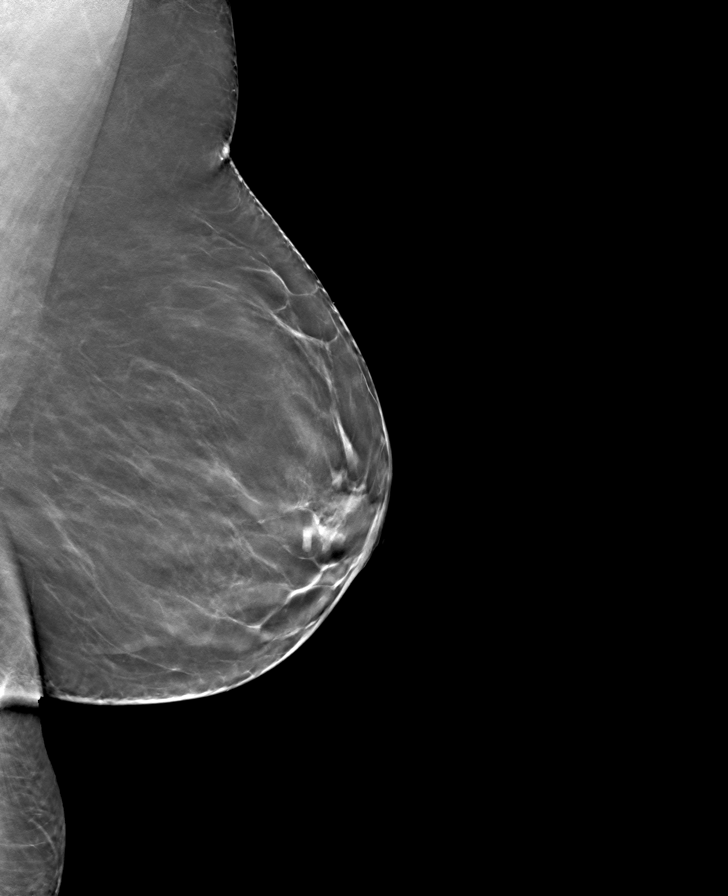

[R MLO tomo · tomo slice 36/71.0]
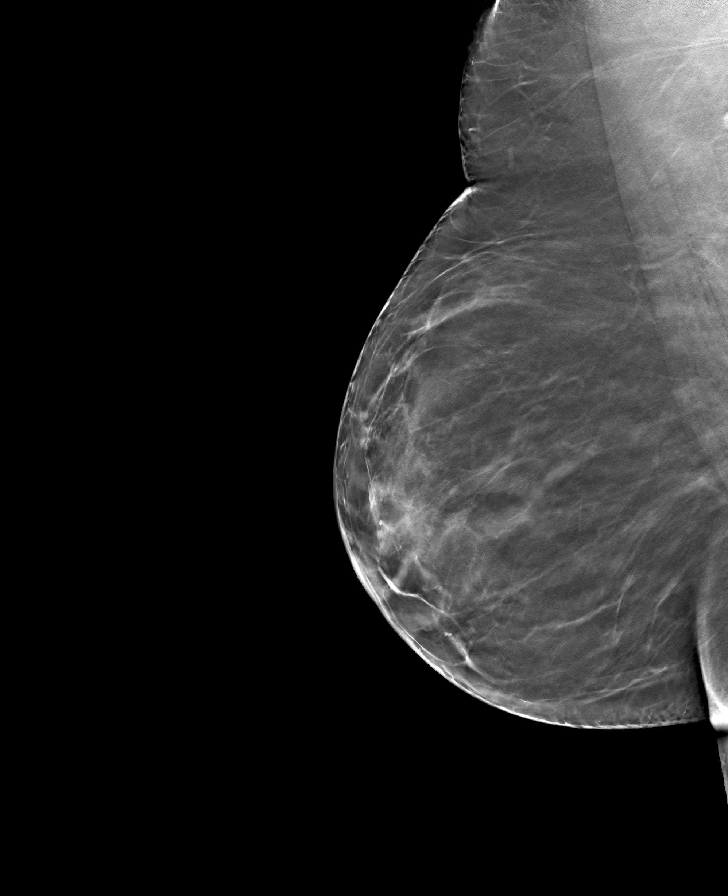

[R CC tomo · tomo slice 30/59.0]
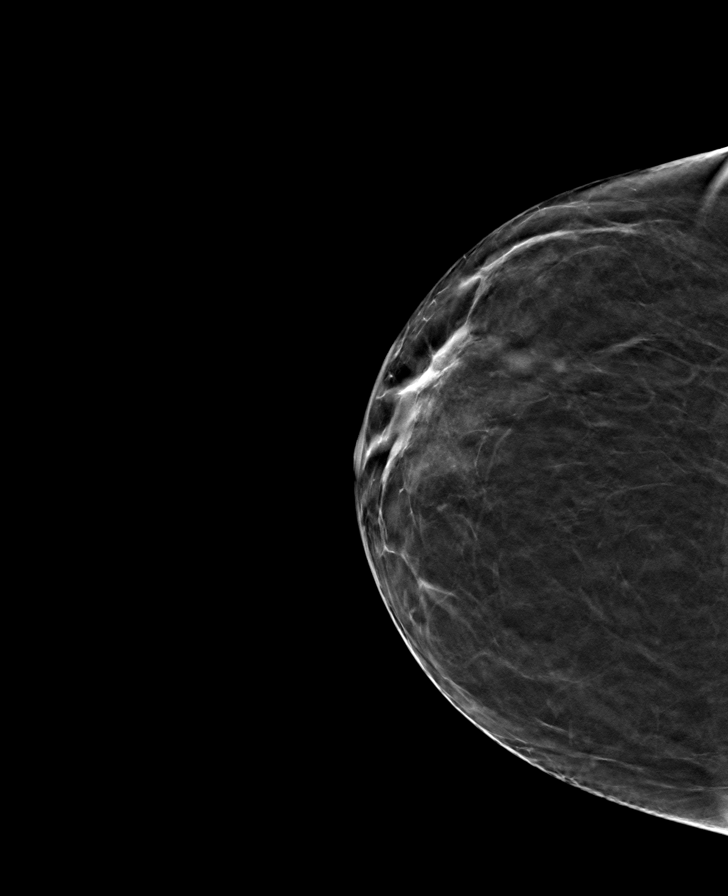

[L CC tomo · tomo slice 32/63.0]
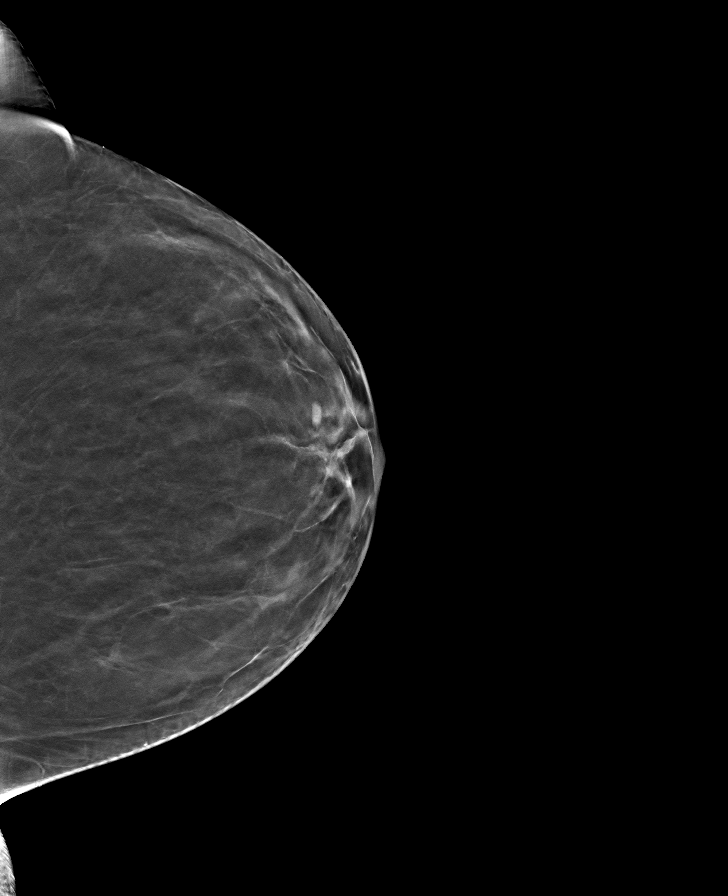

[8 of 24 positions shown; findings below may reference images not displayed]

ACR Breast Density Category b: There are scattered areas of
fibroglandular density.
FINDINGS: There are no findings suspicious for malignancy. Images were
processed with CAD.
IMPRESSION: No mammographic evidence of malignancy. A result letter of this
screening mammogram will be mailed directly to the patient.

RECOMMENDATION:
Screening mammogram in one year. (Code:[TQ])

BI-RADS CATEGORY  1: Negative.

## 2019-12-13 ENCOUNTER — Other Ambulatory Visit: Payer: Self-pay | Admitting: Orthopedic Surgery

## 2019-12-13 DIAGNOSIS — M23322 Other meniscus derangements, posterior horn of medial meniscus, left knee: Secondary | ICD-10-CM

## 2020-01-26 ENCOUNTER — Other Ambulatory Visit: Payer: Self-pay | Admitting: Orthopedic Surgery

## 2020-01-26 DIAGNOSIS — M23322 Other meniscus derangements, posterior horn of medial meniscus, left knee: Secondary | ICD-10-CM

## 2020-02-25 ENCOUNTER — Other Ambulatory Visit: Payer: Self-pay | Admitting: Orthopedic Surgery

## 2020-02-25 ENCOUNTER — Other Ambulatory Visit: Payer: Self-pay | Admitting: Family Medicine

## 2020-02-25 DIAGNOSIS — M23322 Other meniscus derangements, posterior horn of medial meniscus, left knee: Secondary | ICD-10-CM

## 2020-03-24 ENCOUNTER — Other Ambulatory Visit: Payer: Self-pay | Admitting: Orthopedic Surgery

## 2020-03-24 DIAGNOSIS — M23322 Other meniscus derangements, posterior horn of medial meniscus, left knee: Secondary | ICD-10-CM

## 2020-04-01 ENCOUNTER — Telehealth: Payer: Self-pay | Admitting: Family Medicine

## 2020-04-01 ENCOUNTER — Other Ambulatory Visit: Payer: Self-pay | Admitting: *Deleted

## 2020-04-01 DIAGNOSIS — I10 Essential (primary) hypertension: Secondary | ICD-10-CM

## 2020-04-01 DIAGNOSIS — E785 Hyperlipidemia, unspecified: Secondary | ICD-10-CM

## 2020-04-01 NOTE — Telephone Encounter (Signed)
LVM letting pt know labs were ordered

## 2020-04-01 NOTE — Telephone Encounter (Signed)
Patient is needing orders for bloodwork sent to [facility] appt with Dr Moshe Cipro is 04/07/20

## 2020-04-01 NOTE — Telephone Encounter (Signed)
No labs in the system. What would you like to order for pt?

## 2020-04-01 NOTE — Telephone Encounter (Signed)
Pls order fasting lipid, cmp and eGFR

## 2020-04-02 DIAGNOSIS — E785 Hyperlipidemia, unspecified: Secondary | ICD-10-CM | POA: Diagnosis not present

## 2020-04-02 DIAGNOSIS — I10 Essential (primary) hypertension: Secondary | ICD-10-CM | POA: Diagnosis not present

## 2020-04-03 LAB — LIPID PANEL
Chol/HDL Ratio: 3.4 ratio (ref 0.0–4.4)
Cholesterol, Total: 193 mg/dL (ref 100–199)
HDL: 56 mg/dL (ref >39)
LDL Chol Calc (NIH): 127 mg/dL — ABNORMAL HIGH (ref 0–99)
Triglycerides: 54 mg/dL (ref 0–149)
VLDL Cholesterol Cal: 10 mg/dL (ref 5–40)

## 2020-04-03 LAB — CMP14+EGFR
ALT: 16 IU/L (ref 0–32)
AST: 22 IU/L (ref 0–40)
Albumin/Globulin Ratio: 1.5 (ref 1.2–2.2)
Albumin: 4.4 g/dL (ref 3.7–4.7)
Alkaline Phosphatase: 92 IU/L (ref 44–121)
BUN/Creatinine Ratio: 21 (ref 12–28)
BUN: 18 mg/dL (ref 8–27)
Bilirubin Total: 0.6 mg/dL (ref 0.0–1.2)
CO2: 26 mmol/L (ref 20–29)
Calcium: 10.1 mg/dL (ref 8.7–10.3)
Chloride: 104 mmol/L (ref 96–106)
Creatinine, Ser: 0.86 mg/dL (ref 0.57–1.00)
GFR calc Af Amer: 78 mL/min/{1.73_m2} (ref >59)
GFR calc non Af Amer: 68 mL/min/{1.73_m2} (ref >59)
Globulin, Total: 2.9 g/dL (ref 1.5–4.5)
Glucose: 90 mg/dL (ref 65–99)
Potassium: 3.9 mmol/L (ref 3.5–5.2)
Sodium: 142 mmol/L (ref 134–144)
Total Protein: 7.3 g/dL (ref 6.0–8.5)

## 2020-04-07 ENCOUNTER — Other Ambulatory Visit: Payer: Self-pay

## 2020-04-07 ENCOUNTER — Ambulatory Visit (INDEPENDENT_AMBULATORY_CARE_PROVIDER_SITE_OTHER): Payer: Medicare Other | Admitting: Family Medicine

## 2020-04-07 ENCOUNTER — Encounter: Payer: Self-pay | Admitting: Family Medicine

## 2020-04-07 VITALS — BP 123/77 | HR 74 | Resp 16 | Ht 66.5 in | Wt 211.1 lb

## 2020-04-07 DIAGNOSIS — I10 Essential (primary) hypertension: Secondary | ICD-10-CM | POA: Diagnosis not present

## 2020-04-07 DIAGNOSIS — E669 Obesity, unspecified: Secondary | ICD-10-CM | POA: Diagnosis not present

## 2020-04-07 DIAGNOSIS — M5410 Radiculopathy, site unspecified: Secondary | ICD-10-CM

## 2020-04-07 DIAGNOSIS — E785 Hyperlipidemia, unspecified: Secondary | ICD-10-CM

## 2020-04-07 MED ORDER — PREDNISONE 10 MG PO TABS: 10.0000 mg | ORAL_TABLET | Freq: Two times a day (BID) | ORAL | 0 refills | Status: DC

## 2020-04-07 NOTE — Progress Notes (Signed)
Karen [name]     MRN: [id number]      DOB: February 19, 1946   HPI Karen [name] is here for follow up and re-evaluation of chronic medical conditions, medication management and review of any available recent lab and radiology data.  Preventive health is updated, specifically  Cancer screening and Immunization.   Questions or concerns regarding consultations or procedures which the PT has had in the interim are  addressed. The PT denies any adverse reactions to current medications since the last visit.  C/o right thiugh pain and burning intermittently iover past several weeks, no inciting or aggravating factor, has established spine disease   ROS Denies recent fever or chills. Denies sinus pressure, nasal congestion, ear pain or sore throat. Denies chest congestion, productive cough or wheezing. Denies chest pains, palpitations and leg swelling Denies abdominal pain, nausea, vomiting,diarrhea or constipation.   Denies dysuria, frequency, hesitancy or incontinence.  Denies headaches, seizures, numbness, or tingling. Denies depression, anxiety or insomnia. Denies skin break down or rash.   PE  BP 123/77   Pulse 74   Resp 16   Ht 5' 6.5" (1.689 m)   Wt 211 lb 1.9 oz (95.8 kg)   SpO2 98%   BMI 33.57 kg/m   Patient alert and oriented and in no cardiopulmonary distress.  HEENT: No facial asymmetry, EOMI,     Neck supple .  Chest: Clear to auscultation bilaterally.  CVS: S1, S2 no murmurs, no S3.Regular rate.  ABD: Soft non tender.   Ext: No edema  MS: decreased  ROM lumbar  spine, adequate in shoulders, hips and knees.  Skin: Intact, no ulcerations or rash noted.  Psych: Good eye contact, normal affect. Memory intact not anxious or depressed appearing.  CNS: CN 2-12 intact, power,  normal throughout.no focal deficits noted.   Assessment & Plan  Back pain with right-sided radiculopathy Cramping and pain in right thigh x 3 weeks, short course of oral prednisone is  prescribed  Essential hypertension, benign Controlled, no change in medication DASH diet and commitment to daily physical activity for a minimum of 30 minutes discussed and encouraged, as a part of hypertension management. The importance of attaining a healthy weight is also discussed.  BP/Weight 04/07/2020 11/05/2019 05/26/2019 05/01/2019 04/24/2019 12/04/2018 77/41/2878  Systolic BP 676 720 947 096 283 662 947  Diastolic BP 77 72 75 68 80 80 68  Wt. (Lbs) 211.12 211 200 203 209 209 212  BMI 33.57 34.06 32.28 32.77 33.73 33.73 34.22       Obesity (BMI 30.0-34.9)  Patient re-educated about  the importance of commitment to a  minimum of 150 minutes of exercise per week as able.  The importance of healthy food choices with portion control discussed, as well as eating regularly and within a 12 hour window most days. The need to choose "clean , green" food 50 to 75% of the time is discussed, as well as to make water the primary drink and set a goal of 64 ounces water daily.    Weight /BMI 04/07/2020 11/05/2019 05/26/2019  WEIGHT 211 lb 1.9 oz 211 lb 200 lb  HEIGHT 5' 6.5" 5\' 6"  5\' 6"   BMI 33.57 kg/m2 34.06 kg/m2 32.28 kg/m2  Unchnanged    Hyperlipidemia LDL goal <100 Hyperlipidemia:Low fat diet discussed and encouraged.   Lipid Panel  Lab Results  Component Value Date   CHOL 193 04/02/2020   HDL 56 04/02/2020   LDLCALC 127 (H) 04/02/2020   TRIG 54 04/02/2020  CHOLHDL 3.4 04/02/2020   Needs to lower

## 2020-04-07 NOTE — Assessment & Plan Note (Addendum)
Cramping and pain in right thigh x 3 weeks, short course of oral prednisone is prescribed

## 2020-04-07 NOTE — Patient Instructions (Addendum)
Annual physical exam with MD in 5 months call if you need me sooner  5 day course of prednisone is  prescribed for right thigh pain  Use tylenol and meloxicam for arthritis pain  Careful not to fall  Increase vegetables, fruit and water.  Reduce fried and fatty foods  Thanks for choosing [facility], we consider it a privelige to serve you.

## 2020-04-11 ENCOUNTER — Encounter: Payer: Self-pay | Admitting: Family Medicine

## 2020-04-11 NOTE — Assessment & Plan Note (Signed)
  Patient re-educated about  the importance of commitment to a  minimum of 150 minutes of exercise per week as able.  The importance of healthy food choices with portion control discussed, as well as eating regularly and within a 12 hour window most days. The need to choose "clean , green" food 50 to 75% of the time is discussed, as well as to make water the primary drink and set a goal of 64 ounces water daily.    Weight /BMI 04/07/2020 11/05/2019 05/26/2019  WEIGHT 211 lb 1.9 oz 211 lb 200 lb  HEIGHT 5' 6.5" 5\' 6"  5\' 6"   BMI 33.57 kg/m2 34.06 kg/m2 32.28 kg/m2  Unchnanged

## 2020-04-11 NOTE — Assessment & Plan Note (Signed)
Hyperlipidemia:Low fat diet discussed and encouraged.   Lipid Panel  Lab Results  Component Value Date   CHOL 193 04/02/2020   HDL 56 04/02/2020   LDLCALC 127 (H) 04/02/2020   TRIG 54 04/02/2020   CHOLHDL 3.4 04/02/2020   Needs to lower

## 2020-04-11 NOTE — Assessment & Plan Note (Signed)
Controlled, no change in medication DASH diet and commitment to daily physical activity for a minimum of 30 minutes discussed and encouraged, as a part of hypertension management. The importance of attaining a healthy weight is also discussed.  BP/Weight 04/07/2020 11/05/2019 05/26/2019 05/01/2019 04/24/2019 12/04/2018 96/09/5407  Systolic BP 811 914 782 956 213 086 578  Diastolic BP 77 72 75 68 80 80 68  Wt. (Lbs) 211.12 211 200 203 209 209 212  BMI 33.57 34.06 32.28 32.77 33.73 33.73 34.22

## 2020-04-27 ENCOUNTER — Ambulatory Visit (INDEPENDENT_AMBULATORY_CARE_PROVIDER_SITE_OTHER): Payer: Medicare Other | Admitting: Family Medicine

## 2020-04-27 ENCOUNTER — Encounter: Payer: Self-pay | Admitting: Family Medicine

## 2020-04-27 ENCOUNTER — Other Ambulatory Visit: Payer: Self-pay

## 2020-04-27 VITALS — BP 123/77 | HR 74 | Ht 66.5 in | Wt 211.0 lb

## 2020-04-27 DIAGNOSIS — Z0000 Encounter for general adult medical examination without abnormal findings: Secondary | ICD-10-CM | POA: Diagnosis not present

## 2020-04-27 NOTE — Progress Notes (Addendum)
Subjective:   Karen [name] is a 74 y.o. female who presents for Medicare Annual (Subsequent) preventive examination.  Method of visit: Telephone  Location of Patient: Home Location of Provider and Nurse: Office Consent was obtain for visit over the telephone. Services rendered by provider: Visit was performed via telephone I verified that I am speaking with the correct person using two identifiers.  Review of Systems Cardiac Risk Factors include: advanced age (>74men, >62 women);obesity (BMI >30kg/m2);hypertension     Objective:    Today's Vitals   04/27/20 0808  PainSc: 4   PainLoc: Knee   There is no height or weight on file to calculate BMI.  Advanced Directives 04/27/2020 04/22/2018 05/24/2017 04/11/2017 07/18/2013 05/26/2013  Does Patient Have a Medical Advance Directive? No No No No Patient does not have advance directive Patient does not have advance directive;Patient would not like information  Would patient like information on creating a medical advance directive? No - Patient declined Yes (ED - Information included in AVS) No - Patient declined Yes (MAU/Ambulatory/Procedural Areas - Information given) - -  Pre-existing out of facility DNR order (yellow form or pink MOST form) - - - - No No    Current Medications (verified) Outpatient Encounter Medications as of 04/27/2020  Medication Sig  . Ascorbic Acid (VITAMIN C) 1000 MG tablet Take 1,000 mg by mouth daily.  . Cholecalciferol (D3-1000) 1000 UNITS tablet Take 2,000 Units by mouth daily.   . Coenzyme Q10 (CO Q 10 PO) Take 1 tablet by mouth daily.   Marland Kitchen levocetirizine (XYZAL) 5 MG tablet Take 5 mg by mouth every evening.  Marland Kitchen lisinopril-hydrochlorothiazide (ZESTORETIC) 20-25 MG tablet Take 1 tablet by mouth once daily  . meloxicam (MOBIC) 7.5 MG tablet Take 1 tablet by mouth once daily  . predniSONE (DELTASONE) 10 MG tablet Take 1 tablet (10 mg total) by mouth 2 (two) times daily with a meal.  . simvastatin  (ZOCOR) 10 MG tablet TAKE 1 TABLET BY MOUTH ONCE DAILY STOP  ZETIA  . UNABLE TO FIND Bilateral compression hose x 1 pair 15-20 mm/hg   No facility-administered encounter medications on file as of 04/27/2020.    Allergies (verified) Amlodipine, Triamterene, Other, and Penicillins   History: Past Medical History:  Diagnosis Date  . Arthritis   . C. difficile colitis Jul 18, 2013   Vancomycin. Recheck Cdiff positive. Vancomycin continued for an additional 2 weeks  . H/O herpes zoster   . Hyperlipidemia   . Hypertension   . Metabolic syndrome X   . Pes anserinus tendinitis 11/22/2011  . Prediabetes 2014   lifestyle   . Prediabetes    Past Surgical History:  Procedure Laterality Date  . ABDOMINAL HYSTERECTOMY  1983 approx   fibroids, partial  . ANKLE SURGERY    . COLONOSCOPY  04/27/2005   OZH:[id number] polyp at the splenic flexure cold biopsied/removed. Mid  descending polyp removed with cold snare technique. The remainder of the of the colon looked normal/ Normal rectum. inflammed adenomatous polyps.  . COLONOSCOPY N/A 05/26/2013   Dr. Rourk:Multiple colonic polyps removed, tubular adenomas. Needs surveillance Dec 2017.   Marland Kitchen COLONOSCOPY N/A 05/24/2017   Procedure: COLONOSCOPY;  Surgeon: Daneil Dolin, MD;  Location: [facility];  Service: Endoscopy;  Laterality: N/A;  1:00 pm  . FRACTURE SURGERY Right mid 1990's   pin put in and waas removed   Family History  Problem Relation Age of Onset  . Hypertension Mother   . Lung cancer Mother   .  Hypertension Father   . Hypertension Sister   . Hypertension Brother   . Hypertension Sister   . Diabetes Sister   . Hypertension Son   . Hypertension Son   . Colon cancer Neg Hx    Social History   Socioeconomic History  . Marital status: Married    Spouse name: Not on file  . Number of children: 3  . Years of education: 36  . Highest education level: 12th grade  Occupational History  . Occupation: fulltime - caregiver     Tobacco Use  . Smoking status: Never Smoker  . Smokeless tobacco: Never Used  Vaping Use  . Vaping Use: Never used  Substance and Sexual Activity  . Alcohol use: No  . Drug use: No  . Sexual activity: Not Currently    Birth control/protection: Surgical  Other Topics Concern  . Not on file  Social History Narrative   Lives with husband alone, marriage is strained    Social Determinants of Health   Financial Resource Strain: Low Risk   . Difficulty of Paying Living Expenses: Not hard at all  Food Insecurity: No Food Insecurity  . Worried About Charity fundraiser in the Last Year: Never true  . Ran Out of Food in the Last Year: Never true  Transportation Needs: No Transportation Needs  . Lack of Transportation (Medical): No  . Lack of Transportation (Non-Medical): No  Physical Activity: Insufficiently Active  . Days of Exercise per Week: 3 days  . Minutes of Exercise per Session: 30 min  Stress: No Stress Concern Present  . Feeling of Stress : Not at all  Social Connections: Socially Integrated  . Frequency of Communication with Friends and Family: More than three times a week  . Frequency of Social Gatherings with Friends and Family: More than three times a week  . Attends Religious Services: More than 4 times per year  . Active Member of Clubs or Organizations: Yes  . Attends Archivist Meetings: More than 4 times per year  . Marital Status: Married    Tobacco Counseling Counseling given: Yes   Clinical Intake:  Pre-visit preparation completed: Yes  Pain : 0-10 Pain Score: 4  Pain Type: Chronic pain Pain Location: Knee Pain Orientation: Left Pain Descriptors / Indicators: Sharp, Aching Pain Onset: More than a month ago Pain Frequency: Intermittent Pain Relieving Factors: rest, medication, ice Effect of Pain on Daily Activities: mild  Pain Relieving Factors: rest, medication, ice  BMI - recorded: 33.57 Nutritional Status: BMI > 30   Obese Nutritional Risks: None Diabetes: No  How often do you need to have someone help you when you read instructions, pamphlets, or other written materials from your doctor or pharmacy?: 1 - Never What is the last grade level 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 no  Interpreter Needed?: No  Information entered by :: Laretta Bolster, LPN   Activities of Daily Living In your present state of health, do you have any difficulty performing the following activities: 04/27/2020  Hearing? N  Vision? N  Walking or climbing stairs? Y  Dressing or bathing? N  Doing errands, shopping? N  Preparing Food and eating ? N  Using the Toilet? N  In the past six months, have you accidently leaked urine? N  Do you have problems with loss of bowel control? N  Managing your Medications? N  Managing your Finances? N  Housekeeping or managing your Housekeeping? N  Some recent  data might be hidden    Patient Care Team: Fayrene Helper, MD as PCP - General Herminio Commons, MD (Inactive) as PCP - Cardiology (Cardiology) Gala Romney Cristopher Estimable, MD as Consulting Physician (Gastroenterology)  Indicate any recent Medical Services you may have received from other than [facility] providers in the past year (date may be approximate).     Assessment:   This is a routine wellness examination for Asyia.  Hearing/Vision screen No exam data present  Dietary issues and exercise activities discussed: Current Exercise Habits: Home exercise routine, Type of exercise: walking, Time (Minutes): 30, Frequency (Times/Week): 4, Weekly Exercise (Minutes/Week): 120, Intensity: Mild, Exercise limited by: None identified  Goals    . Exercise 3x per week (30 min per time)     Recommend you increase your exercise program at least 3 days a week for 30-45 minutes at a time as tolerated.      . Weight (lb) < 200 lb (90.7 kg)      Depression Screen PHQ 2/9 Scores 04/27/2020 04/27/2020 04/07/2020 11/05/2019  04/24/2019 12/04/2018 12/04/2018  PHQ - 2 Score 0 0 0 0 1 0 0  PHQ- 9 Score - - - - - - -    Fall Risk Fall Risk  04/27/2020 04/07/2020 11/05/2019 05/01/2019 04/24/2019  Falls in the past year? 0 0 1 0 0  Number falls in past yr: 0 - 0 0 0  Injury with Fall? 0 - 0 0 0  Risk for fall due to : No Fall Risks - - - -  Risk for fall due to: Comment - - - - -  Follow up Falls evaluation completed - - - -    Any stairs in or around the home? No  If so, are there any without handrails? No  Home free of loose throw rugs in walkways, pet beds, electrical cords, etc? Yes  Adequate lighting in your home to reduce risk of falls? Yes   ASSISTIVE DEVICES UTILIZED TO PREVENT FALLS:  Life alert? No  Use of a cane, walker or w/c? No  Grab bars in the bathroom? No  Shower chair or bench in shower? No  Elevated toilet seat or a handicapped toilet? No   TIMED UP AND GO:  Was the test performed? No .  Length of time to ambulate n/a   Cognitive Function:     6CIT Screen 04/27/2020 04/24/2019 04/22/2018 04/11/2017  What Year? 0 points 0 points 0 points 0 points  What month? 0 points 0 points 0 points 0 points  What time? 0 points 0 points 3 points 0 points  Count back from 20 0 points 0 points 0 points 0 points  Months in reverse 0 points 0 points 0 points 0 points  Repeat phrase 0 points 0 points 0 points 0 points  Total Score 0 0 3 0    Immunizations Immunization History  Administered Date(s) Administered  . Fluad Quad(high Dose 65+) 02/11/2019  . H1N1 04/17/2008  . Influenza Whole 03/29/2006, 07/30/2008, 03/26/2009  . Influenza,inj,Quad PF,6+ Mos 02/20/2013, 03/23/2014, 04/07/2015, 03/21/2016, 02/13/2017, 02/14/2018  . Influenza-Unspecified 03/17/2020  . Moderna SARS-COVID-2 Vaccination 07/17/2019, 08/18/2019  . Pneumococcal Conjugate-13 07/16/2014  . Pneumococcal Polysaccharide-23 02/20/2013  . Td 04/27/2004  . Varicella 02/22/2013    TDAP status: Due, Education has been  provided regarding the importance of this vaccine. Advised may receive this vaccine at local pharmacy or Health Dept. Aware to provide a copy of the vaccination record if obtained from local pharmacy or  Health Dept. Verbalized acceptance and understanding. Flu Vaccine status: Up to date Pneumococcal vaccine status: Up to date Covid-19 vaccine status: Completed vaccines  Qualifies for Shingles Vaccine? Yes   Zostavax completed Yes   Shingrix Completed?: No.    Education has been provided regarding the importance of this vaccine. Patient has been advised to call insurance company to determine out of pocket expense if they have not yet received this vaccine. Advised may also receive vaccine at local pharmacy or Health Dept. Verbalized acceptance and understanding.  Screening Tests Health Maintenance  Topic Date Due  . TETANUS/TDAP  11/04/2020 (Originally 04/27/2014)  . COLONOSCOPY  05/24/2020  . MAMMOGRAM  12/11/2021  . INFLUENZA VACCINE  Completed  . DEXA SCAN  Completed  . COVID-19 Vaccine  Completed  . Hepatitis C Screening  Completed  . PNA vac Low Risk Adult  Completed    Health Maintenance  There are no preventive care reminders to display for this patient.  Colorectal cancer screening: Completed 05/24/17. Repeat every 10 years Mammogram status: Completed 12/12/19. Repeat every year Bone Density status: Completed 12/21/14. Results reflect: Bone density results: NORMAL. Repeat every 5 years.  Lung Cancer Screening: (Low Dose CT Chest recommended if Age 14-80 years, 30 pack-year currently smoking OR have quit w/in 15years.) does not qualify.   Lung Cancer Screening Referral: n/a  Additional Screening:  Hepatitis C Screening: does not qualify  Vision Screening: Recommended annual ophthalmology exams for early detection of glaucoma and other disorders of the eye. Is the patient up to date with their annual eye exam?  Yes  Who is the provider or what is the name of the office in  which the patient attends annual eye exams? Dr. Radford Pax  If pt is not established with a provider, would they like to be referred to a provider to establish care? No .   Dental Screening: Recommended annual dental exams for proper oral hygiene  Community Resource Referral / Chronic Care Management: CRR required this visit?  No   CCM required this visit?  No      Plan:      1. Encounter for Medicare annual wellness exam  I have personally reviewed and noted the following in the patient's chart:   . Medical and social history . Use of alcohol, tobacco or illicit drugs  . Current medications and supplements . Functional ability and status . Nutritional status . Physical activity . Advanced directives . List of other physicians . Hospitalizations, surgeries, and ER visits in previous 12 months . Vitals . Screenings to include cognitive, depression, and falls . Referrals and appointments  In addition, I have reviewed and discussed with patient certain preventive protocols, quality metrics, and best practice recommendations. A written personalized care plan for preventive services as well as general preventive health recommendations were provided to patient.     Perlie Mayo, NP   04/27/2020   Nurse Notes: AWV conducted in office by phone with patient consent to do telehealth visit. Provider was in our office. Visit took 30 minutes to complete.

## 2020-04-27 NOTE — Patient Instructions (Addendum)
Karen [name] , Thank you for taking time to come for your Medicare Wellness Visit. I appreciate your ongoing commitment to your health goals. Please review the following plan we discussed and let me know if I can assist you in the future.   Screening recommendations/referrals: Colonoscopy: 05/24/20 Mammogram: 12/11/21 Bone Density: Complete Recommended yearly ophthalmology/optometry visit for glaucoma screening and checkup Recommended yearly dental visit for hygiene and checkup  Vaccinations: Influenza vaccine: Fall 2022 Pneumococcal vaccine: Complete Tdap vaccine: 11/04/20 Shingles vaccine: Education provided  Advanced directives: No  Conditions/risks identified: None  Next appointment: 72/28/22 @ 8:40 am   Preventive Care 72 Years and Older, Female Preventive care refers to lifestyle choices and visits with your health care provider that can promote health and wellness. What does preventive care include?  A yearly physical exam. This is also called an annual well check.  Dental exams once or twice a year.  Routine eye exams. Ask your health care provider how often you should have your eyes checked.  Personal lifestyle choices, including:  Daily care of your teeth and gums.  Regular physical activity.  Eating a healthy diet.  Avoiding tobacco and drug use.  Limiting alcohol use.  Practicing safe sex.  Taking low-dose aspirin every day.  Taking vitamin and mineral supplements as recommended by your health care provider. What happens during an annual well check? The services and screenings done by your health care provider during your annual well check will depend on your age, overall health, lifestyle risk factors, and family history of disease. Counseling  Your health care provider may ask you questions about your:  Alcohol use.  Tobacco use.  Drug use.  Emotional well-being.  Home and relationship well-being.  Sexual activity.  Eating  habits.  History of falls.  Memory and ability to understand (cognition).  Work and work Statistician.  Reproductive health. Screening  You may have the following tests or measurements:  Height, weight, and BMI.  Blood pressure.  Lipid and cholesterol levels. These may be checked every 5 years, or more frequently if you are over 58 years old.  Skin check.  Lung cancer screening. You may have this screening every year starting at age 30 if you have a 30-pack-year history of smoking and currently smoke or have quit within the past 15 years.  Fecal occult blood test (FOBT) of the stool. You may have this test every year starting at age 70.  Flexible sigmoidoscopy or colonoscopy. You may have a sigmoidoscopy every 5 years or a colonoscopy every 10 years starting at age 95.  Hepatitis C blood test.  Hepatitis B blood test.  Sexually transmitted disease (STD) testing.  Diabetes screening. This is done by checking your blood sugar (glucose) after you have not eaten for a while (fasting). You may have this done every 1-3 years.  Bone density scan. This is done to screen for osteoporosis. You may have this done starting at age 32.  Mammogram. This may be done every 1-2 years. Talk to your health care provider about how often you should have regular mammograms. Talk with your health care provider about your test results, treatment options, and if necessary, the need for more tests. Vaccines  Your health care provider may recommend certain vaccines, such as:  Influenza vaccine. This is recommended every year.  Tetanus, diphtheria, and acellular pertussis (Tdap, Td) vaccine. You may need a Td booster every 10 years.  Zoster vaccine. You may need this after age 39.  Pneumococcal 13-valent conjugate (  PCV13) vaccine. One dose is recommended after age 30.  Pneumococcal polysaccharide (PPSV23) vaccine. One dose is recommended after age 5. Talk to your health care provider about which  screenings and vaccines you need and how often you need them. This information is not intended to replace advice given to you by your health care provider. Make sure you discuss any questions you have with your health care provider. Document Released: 06/25/2015 Document Revised: 02/16/2016 Document Reviewed: 03/30/2015 Elsevier Interactive Patient Education  2017 [employer] Prevention in the Home Falls can cause injuries. They can happen to people of all ages. There are many things you can do to make your home safe and to help prevent falls. What can I do on the outside of my home?  Regularly fix the edges of walkways and driveways and fix any cracks.  Remove anything that might make you trip as you walk through a door, such as a raised step or threshold.  Trim any bushes or trees on the path to your home.  Use bright outdoor lighting.  Clear any walking paths of anything that might make someone trip, such as rocks or tools.  Regularly check to see if handrails are loose or broken. Make sure that both sides of any steps have handrails.  Any raised decks and porches should have guardrails on the edges.  Have any leaves, snow, or ice cleared regularly.  Use sand or salt on walking paths during winter.  Clean up any spills in your garage right away. This includes oil or grease spills. What can I do in the bathroom?  Use night lights.  Install grab bars by the toilet and in the tub and shower. Do not use towel bars as grab bars.  Use non-skid mats or decals in the tub or shower.  If you need to sit down in the shower, use a plastic, non-slip stool.  Keep the floor dry. Clean up any water that spills on the floor as soon as it happens.  Remove soap buildup in the tub or shower regularly.  Attach bath mats securely with double-sided non-slip rug tape.  Do not have throw rugs and other things on the floor that can make you trip. What can I do in the bedroom?  Use  night lights.  Make sure that you have a light by your bed that is easy to reach.  Do not use any sheets or blankets that are too big for your bed. They should not hang down onto the floor.  Have a firm chair that has side arms. You can use this for support while you get dressed.  Do not have throw rugs and other things on the floor that can make you trip. What can I do in the kitchen?  Clean up any spills right away.  Avoid walking on wet floors.  Keep items that you use a lot in easy-to-reach places.  If you need to reach something above you, use a strong step stool that has a grab bar.  Keep electrical cords out of the way.  Do not use floor polish or wax that makes floors slippery. If you must use wax, use non-skid floor wax.  Do not have throw rugs and other things on the floor that can make you trip. What can I do with my stairs?  Do not leave any items on the stairs.  Make sure that there are handrails on both sides of the stairs and use them. Fix handrails that  are broken or loose. Make sure that handrails are as long as the stairways.  Check any carpeting to make sure that it is firmly attached to the stairs. Fix any carpet that is loose or worn.  Avoid having throw rugs at the top or bottom of the stairs. If you do have throw rugs, attach them to the floor with carpet tape.  Make sure that you have a light switch at the top of the stairs and the bottom of the stairs. If you do not have them, ask someone to add them for you. What else can I do to help prevent falls?  Wear shoes that:  Do not have high heels.  Have rubber bottoms.  Are comfortable and fit you well.  Are closed at the toe. Do not wear sandals.  If you use a stepladder:  Make sure that it is fully opened. Do not climb a closed stepladder.  Make sure that both sides of the stepladder are locked into place.  Ask someone to hold it for you, if possible.  Clearly mark and make sure that you  can see:  Any grab bars or handrails.  First and last steps.  Where the edge of each step is.  Use tools that help you move around (mobility aids) if they are needed. These include:  Canes.  Walkers.  Scooters.  Crutches.  Turn on the lights when you go into a dark area. Replace any light bulbs as soon as they burn out.  Set up your furniture so you have a clear path. Avoid moving your furniture around.  If any of your floors are uneven, fix them.  If there are any pets around you, be aware of where they are.  Review your medicines with your doctor. Some medicines can make you feel dizzy. This can increase your chance of falling. Ask your doctor what other things that you can do to help prevent falls. This information is not intended to replace advice given to you by your health care provider. Make sure you discuss any questions you have with your health care provider. Document Released: 03/25/2009 Document Revised: 11/04/2015 Document Reviewed: 07/03/2014 Elsevier Interactive Patient Education  2017 [employer].

## 2020-05-19 DIAGNOSIS — H43811 Vitreous degeneration, right eye: Secondary | ICD-10-CM | POA: Diagnosis not present

## 2020-05-29 ENCOUNTER — Other Ambulatory Visit: Payer: Self-pay | Admitting: Family Medicine

## 2020-07-06 DIAGNOSIS — Z20822 Contact with and (suspected) exposure to covid-19: Secondary | ICD-10-CM | POA: Diagnosis not present

## 2020-07-28 ENCOUNTER — Other Ambulatory Visit: Payer: Self-pay | Admitting: Orthopedic Surgery

## 2020-07-28 DIAGNOSIS — M23322 Other meniscus derangements, posterior horn of medial meniscus, left knee: Secondary | ICD-10-CM

## 2020-07-28 NOTE — Telephone Encounter (Signed)
Rx request 

## 2020-09-01 ENCOUNTER — Other Ambulatory Visit: Payer: Self-pay | Admitting: Family Medicine

## 2020-09-06 ENCOUNTER — Encounter: Payer: Medicare Other | Admitting: Family Medicine

## 2020-09-21 ENCOUNTER — Other Ambulatory Visit: Payer: Self-pay

## 2020-09-21 DIAGNOSIS — I10 Essential (primary) hypertension: Secondary | ICD-10-CM | POA: Diagnosis not present

## 2020-09-21 DIAGNOSIS — E785 Hyperlipidemia, unspecified: Secondary | ICD-10-CM

## 2020-09-22 LAB — CMP14+EGFR
ALT: 25 IU/L (ref 0–32)
AST: 25 IU/L (ref 0–40)
Albumin/Globulin Ratio: 1.6 (ref 1.2–2.2)
Albumin: 4.3 g/dL (ref 3.7–4.7)
Alkaline Phosphatase: 89 IU/L (ref 44–121)
BUN/Creatinine Ratio: 20 (ref 12–28)
BUN: 16 mg/dL (ref 8–27)
Bilirubin Total: 0.6 mg/dL (ref 0.0–1.2)
CO2: 24 mmol/L (ref 20–29)
Calcium: 10.3 mg/dL (ref 8.7–10.3)
Chloride: 102 mmol/L (ref 96–106)
Creatinine, Ser: 0.81 mg/dL (ref 0.57–1.00)
Globulin, Total: 2.7 g/dL (ref 1.5–4.5)
Glucose: 90 mg/dL (ref 65–99)
Potassium: 3.9 mmol/L (ref 3.5–5.2)
Sodium: 139 mmol/L (ref 134–144)
Total Protein: 7 g/dL (ref 6.0–8.5)
eGFR: 77 mL/min/{1.73_m2} (ref >59)

## 2020-09-22 LAB — LIPID PANEL
Chol/HDL Ratio: 3.4 ratio (ref 0.0–4.4)
Cholesterol, Total: 187 mg/dL (ref 100–199)
HDL: 55 mg/dL (ref >39)
LDL Chol Calc (NIH): 117 mg/dL — ABNORMAL HIGH (ref 0–99)
Triglycerides: 82 mg/dL (ref 0–149)
VLDL Cholesterol Cal: 15 mg/dL (ref 5–40)

## 2020-09-27 ENCOUNTER — Other Ambulatory Visit: Payer: Self-pay

## 2020-09-27 ENCOUNTER — Encounter: Payer: Self-pay | Admitting: Family Medicine

## 2020-09-27 ENCOUNTER — Ambulatory Visit (INDEPENDENT_AMBULATORY_CARE_PROVIDER_SITE_OTHER): Payer: Medicare Other | Admitting: Family Medicine

## 2020-09-27 VITALS — BP 144/88 | HR 76 | Resp 15 | Ht 67.0 in | Wt 216.0 lb

## 2020-09-27 DIAGNOSIS — I10 Essential (primary) hypertension: Secondary | ICD-10-CM

## 2020-09-27 DIAGNOSIS — Z0000 Encounter for general adult medical examination without abnormal findings: Secondary | ICD-10-CM

## 2020-09-27 DIAGNOSIS — E559 Vitamin D deficiency, unspecified: Secondary | ICD-10-CM

## 2020-09-27 DIAGNOSIS — M1711 Unilateral primary osteoarthritis, right knee: Secondary | ICD-10-CM

## 2020-09-27 DIAGNOSIS — Z1231 Encounter for screening mammogram for malignant neoplasm of breast: Secondary | ICD-10-CM

## 2020-09-27 DIAGNOSIS — E669 Obesity, unspecified: Secondary | ICD-10-CM

## 2020-09-27 DIAGNOSIS — E785 Hyperlipidemia, unspecified: Secondary | ICD-10-CM

## 2020-09-27 MED ORDER — LISINOPRIL 10 MG PO TABS: 10.0000 mg | ORAL_TABLET | Freq: Every day | ORAL | 3 refills | Status: DC

## 2020-09-27 NOTE — Assessment & Plan Note (Signed)
  Patient re-educated about  the importance of commitment to a  minimum of 150 minutes of exercise per week as able.  The importance of healthy food choices with portion control discussed, as well as eating regularly and within a 12 hour window most days. The need to choose "clean , green" food 50 to 75% of the time is discussed, as well as to make water the primary drink and set a goal of 64 ounces water daily.    Weight /BMI 09/27/2020 04/27/2020 04/07/2020  WEIGHT 216 lb 211 lb 211 lb 1.9 oz  HEIGHT 5\' 7"  5' 6.5" 5' 6.5"  BMI 33.83 kg/m2 33.55 kg/m2 33.57 kg/m2

## 2020-09-27 NOTE — Assessment & Plan Note (Signed)
Uncontrolled, add lisinopril DASH diet and commitment to daily physical activity for a minimum of 30 minutes discussed and encouraged, as a part of hypertension management. The importance of attaining a healthy weight is also discussed.  BP/Weight 09/27/2020 04/27/2020 04/07/2020 11/05/2019 05/26/2019 05/01/2019 85/88/5027  Systolic BP 741 287 867 672 094 709 628  Diastolic BP 88 77 77 72 75 68 80  Wt. (Lbs) 216 211 211.12 211 200 203 209  BMI 33.83 33.55 33.57 34.06 32.28 32.77 33.73

## 2020-09-27 NOTE — Assessment & Plan Note (Signed)
Hyperlipidemia:Low fat diet discussed and encouraged.   Lipid Panel  Lab Results  Component Value Date   CHOL 187 09/21/2020   HDL 55 09/21/2020   LDLCALC 117 (H) 09/21/2020   TRIG 82 09/21/2020   CHOLHDL 3.4 09/21/2020     Updated lab needed at/ before next visit.

## 2020-09-27 NOTE — Assessment & Plan Note (Signed)
worsening pain and debility of right knee

## 2020-09-27 NOTE — Assessment & Plan Note (Signed)

## 2020-09-27 NOTE — Patient Instructions (Addendum)
F/u in office in 5 months,  Re eval BP and weight , call if you need me before  Covid booster due May , 2022   Please schedule July 3 or after mammogram at checkout  Fasting CBC, lipid, cmp and eGFr, TSH and Vit D 5 days before next visit  New additional med lisinopril 10 mg once daily for better blood pressure control, continue what you are already taking as before  It is important that you exercise regularly at least 30 minutes 5 times a week. If you develop chest pain, have severe difficulty breathing, or feel very tired, stop exercising immediately and seek medical attention    Thanks for choosing [facility], we consider it a privelige to serve you.

## 2020-09-27 NOTE — Progress Notes (Signed)
Karen [name]     MRN: [id number]      DOB: 08-Jan-1946  HPI: Patient is in for annual physical exam. No other health concerns are expressed or addressed at the visit. Recent labs,  are reviewed. Immunization is reviewed , and  updated if needed.   PE: BP (!) 144/88   Pulse 76   Resp 15   Ht 5\' 7"  (1.702 m)   Wt 216 lb (98 kg)   SpO2 99%   BMI 33.83 kg/m    Pleasant  female, alert and oriented x 3, in no cardio-pulmonary distress. Afebrile. HEENT No facial trauma or asymetry. Sinuses non tender.  Extra occullar muscles intact.. External ears normal, . Neck: supple, no adenopathy,JVD or thyromegaly.No bruits.  Chest: Clear to ascultation bilaterally.No crackles or wheezes. Non tender to palpation  Breast: Asymptomatic, not examined  Cardiovascular system; Heart sounds normal,  S1 and  S2 ,no S3.  No murmur, or thrill. Apical beat not displaced Peripheral pulses normal.  Abdomen: Soft, non tender,nd.      Musculoskeletal exam: Full ROM of spine, hips , shoulders and reduced in right  knees.  deformity ,swelling and crepitus noted.in right knee No muscle wasting or atrophy.   Neurologic: Cranial nerves 2 to 12 intact. Power, tone ,sensation and reflexes normal throughout. No disturbance in gait. No tremor.  Skin: Intact, no ulceration, erythema , scaling or rash noted. Pigmentation normal throughout  Psych; Normal mood and affect. Judgement and concentration normal   Assessment & Plan:  Annual physical exam Annual exam as documented. Counseling done  re healthy lifestyle involving commitment to 150 minutes exercise per week, heart healthy diet, and attaining healthy weight.The importance of adequate sleep also discussed. Regular seat belt use and home safety, is also discussed. Changes in health habits are decided on by the patient with goals and time frames  set for achieving them. Immunization and cancer screening needs are specifically  addressed at this visit.   Essential hypertension, benign Uncontrolled, add lisinopril DASH diet and commitment to daily physical activity for a minimum of 30 minutes discussed and encouraged, as a part of hypertension management. The importance of attaining a healthy weight is also discussed.  BP/Weight 09/27/2020 04/27/2020 04/07/2020 11/05/2019 05/26/2019 05/01/2019 14/48/1856  Systolic BP 314 970 263 785 885 027 741  Diastolic BP 88 77 77 72 75 68 80  Wt. (Lbs) 216 211 211.12 211 200 203 209  BMI 33.83 33.55 33.57 34.06 32.28 32.77 33.73       Obesity (BMI 30.0-34.9)  Patient re-educated about  the importance of commitment to a  minimum of 150 minutes of exercise per week as able.  The importance of healthy food choices with portion control discussed, as well as eating regularly and within a 12 hour window most days. The need to choose "clean , green" food 50 to 75% of the time is discussed, as well as to make water the primary drink and set a goal of 64 ounces water daily.    Weight /BMI 09/27/2020 04/27/2020 04/07/2020  WEIGHT 216 lb 211 lb 211 lb 1.9 oz  HEIGHT 5\' 7"  5' 6.5" 5' 6.5"  BMI 33.83 kg/m2 33.55 kg/m2 33.57 kg/m2      Hyperlipidemia LDL goal <100 Hyperlipidemia:Low fat diet discussed and encouraged.   Lipid Panel  Lab Results  Component Value Date   CHOL 187 09/21/2020   HDL 55 09/21/2020   LDLCALC 117 (H) 09/21/2020   TRIG 82 09/21/2020  CHOLHDL 3.4 09/21/2020     Updated lab needed at/ before next visit.   Osteoarthritis of knee worsening pain and debility of right knee

## 2020-10-07 ENCOUNTER — Other Ambulatory Visit: Payer: Self-pay | Admitting: Family Medicine

## 2020-11-29 ENCOUNTER — Other Ambulatory Visit: Payer: Self-pay | Admitting: Family Medicine

## 2020-12-01 ENCOUNTER — Other Ambulatory Visit: Payer: Self-pay | Admitting: Orthopedic Surgery

## 2020-12-01 DIAGNOSIS — M23322 Other meniscus derangements, posterior horn of medial meniscus, left knee: Secondary | ICD-10-CM

## 2020-12-17 ENCOUNTER — Ambulatory Visit (HOSPITAL_COMMUNITY): Payer: Medicare Other

## 2020-12-23 ENCOUNTER — Ambulatory Visit (HOSPITAL_COMMUNITY)
Admission: RE | Admit: 2020-12-23 | Discharge: 2020-12-23 | Disposition: A | Discharge status: Home / Self Care | Payer: Medicare Other | Source: Ambulatory Visit | Attending: Family Medicine | Admitting: Family Medicine

## 2020-12-23 ENCOUNTER — Other Ambulatory Visit: Payer: Self-pay

## 2020-12-23 DIAGNOSIS — Z1231 Encounter for screening mammogram for malignant neoplasm of breast: Secondary | ICD-10-CM | POA: Diagnosis not present

## 2020-12-23 IMAGING — MG MM DIGITAL SCREENING BILAT W/ TOMO AND CAD
6 of 12 series · 6 of 36 positions shown · non-contrast
Comparison: Previous exam(s).

CLINICAL DATA: Screening.

EXAM:
DIGITAL SCREENING BILATERAL MAMMOGRAM WITH TOMOSYNTHESIS AND CAD
TECHNIQUE: Bilateral screening digital craniocaudal and mediolateral oblique
mammograms were obtained. Bilateral screening digital breast
tomosynthesis was performed. The images were evaluated with
computer-aided detection.

[R MLO synth-2D (1 of 2)]
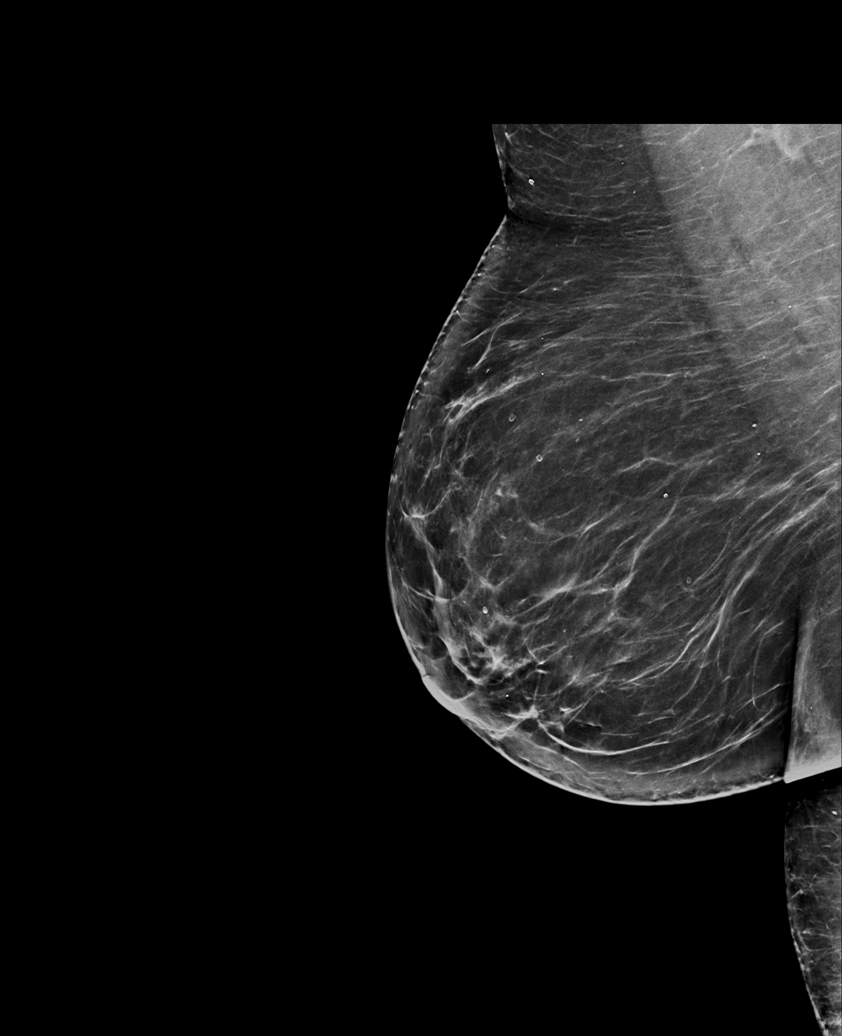

[L MLO synth-2D]
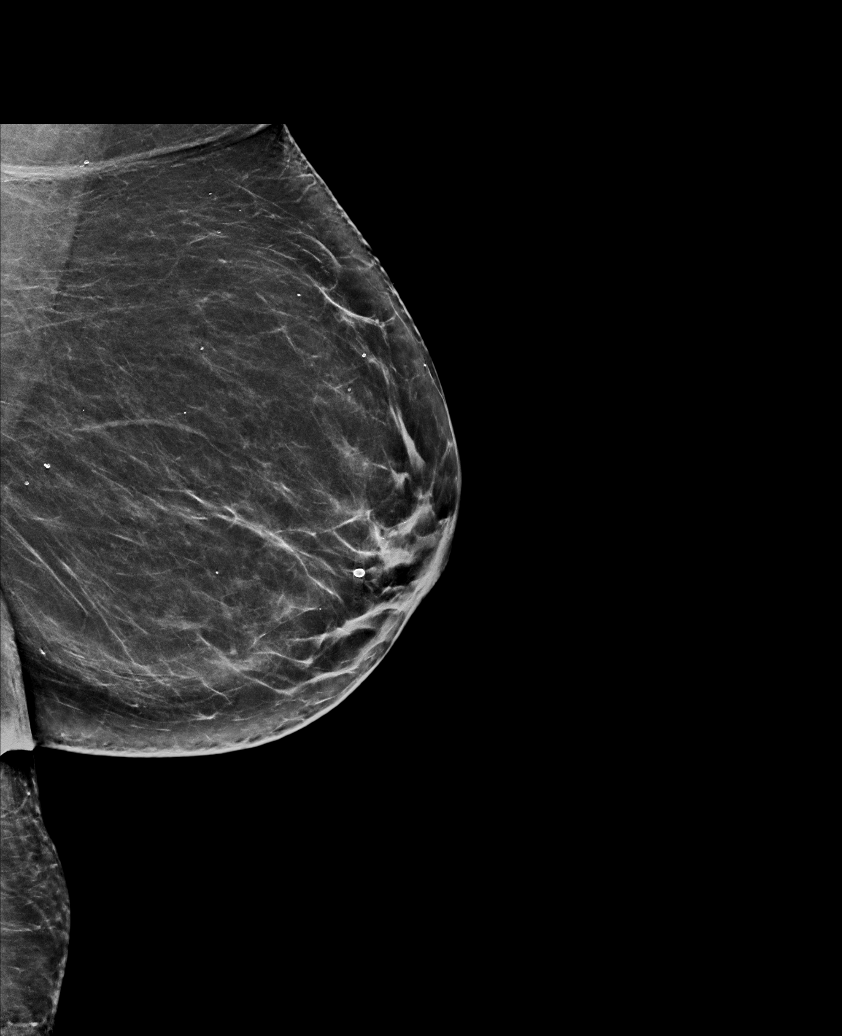

[L CC synth-2D (1 of 2)]
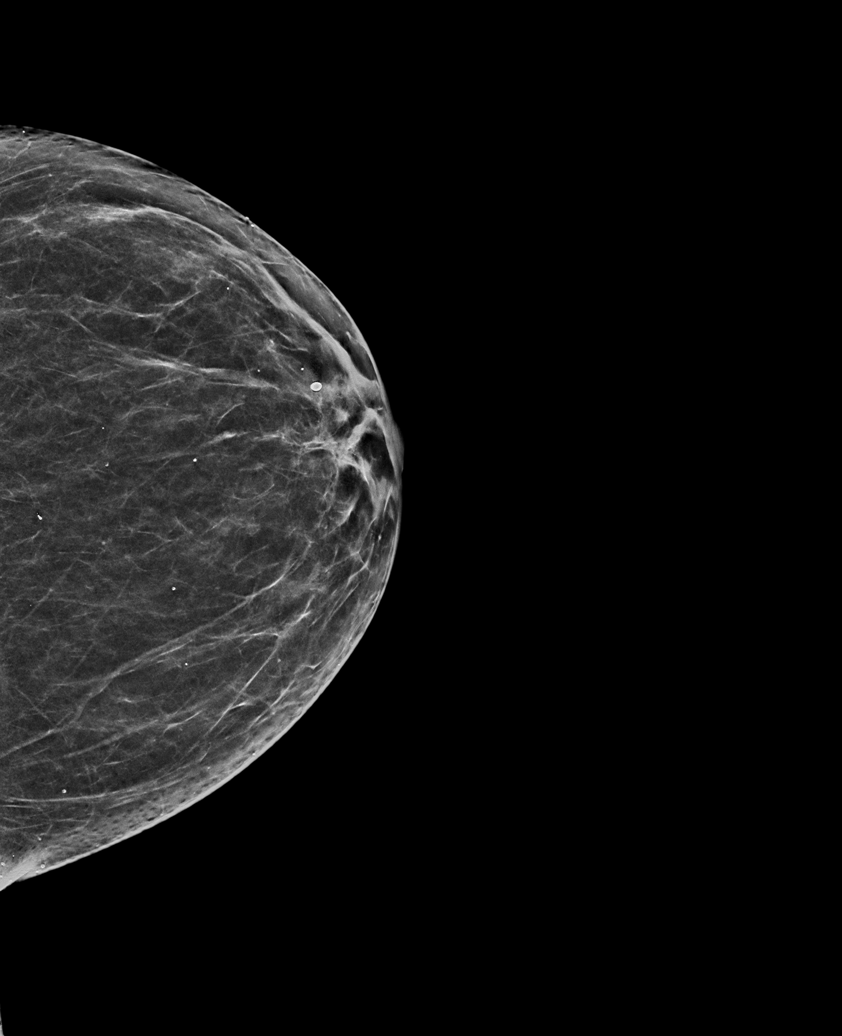

[R MLO synth-2D (2 of 2)]
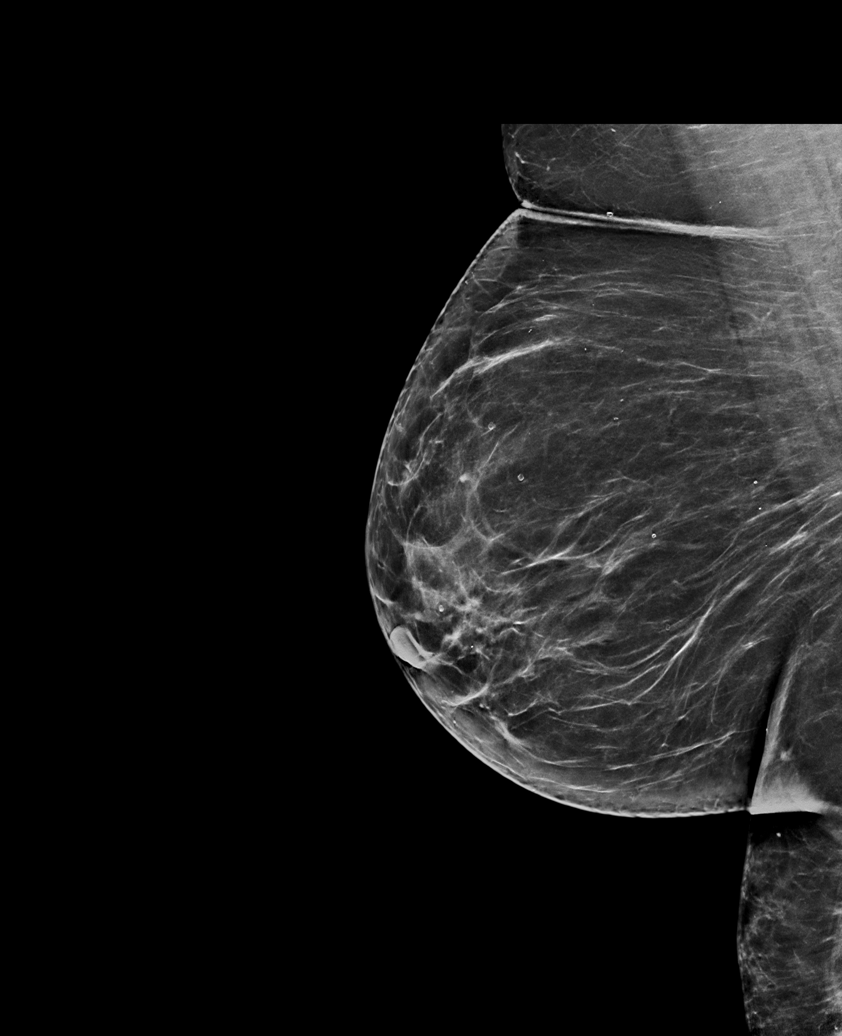

[R CC synth-2D]
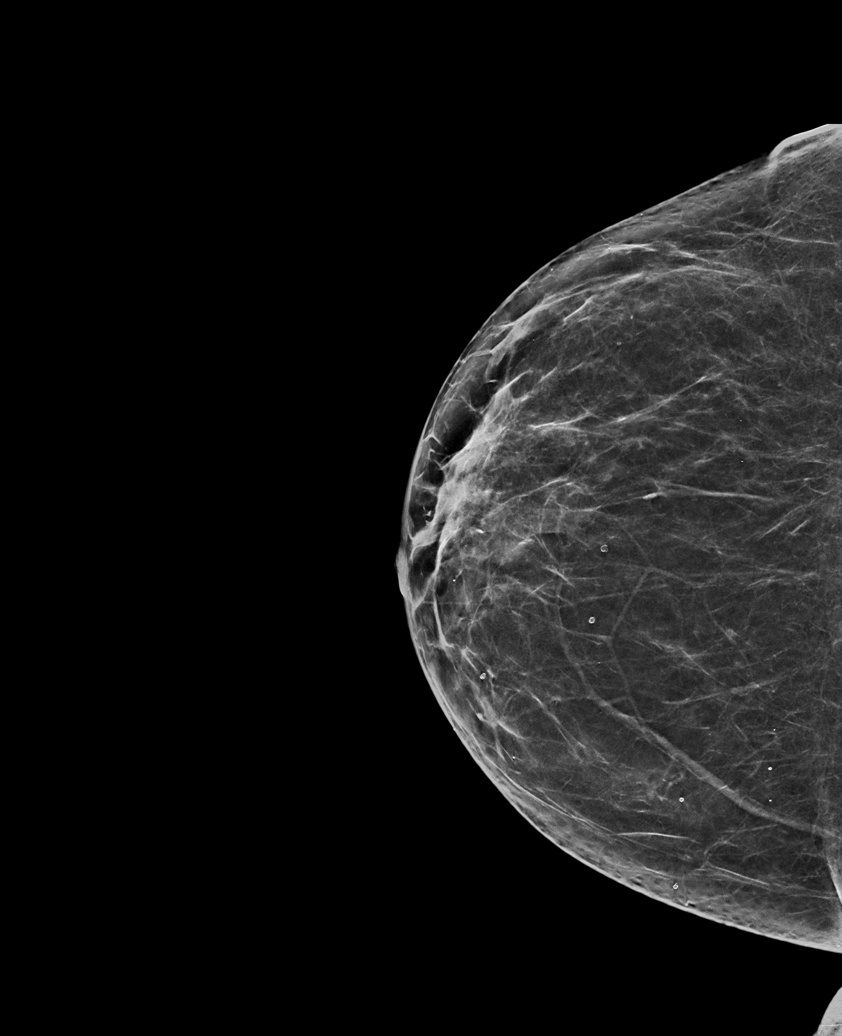

[L CC synth-2D (2 of 2)]
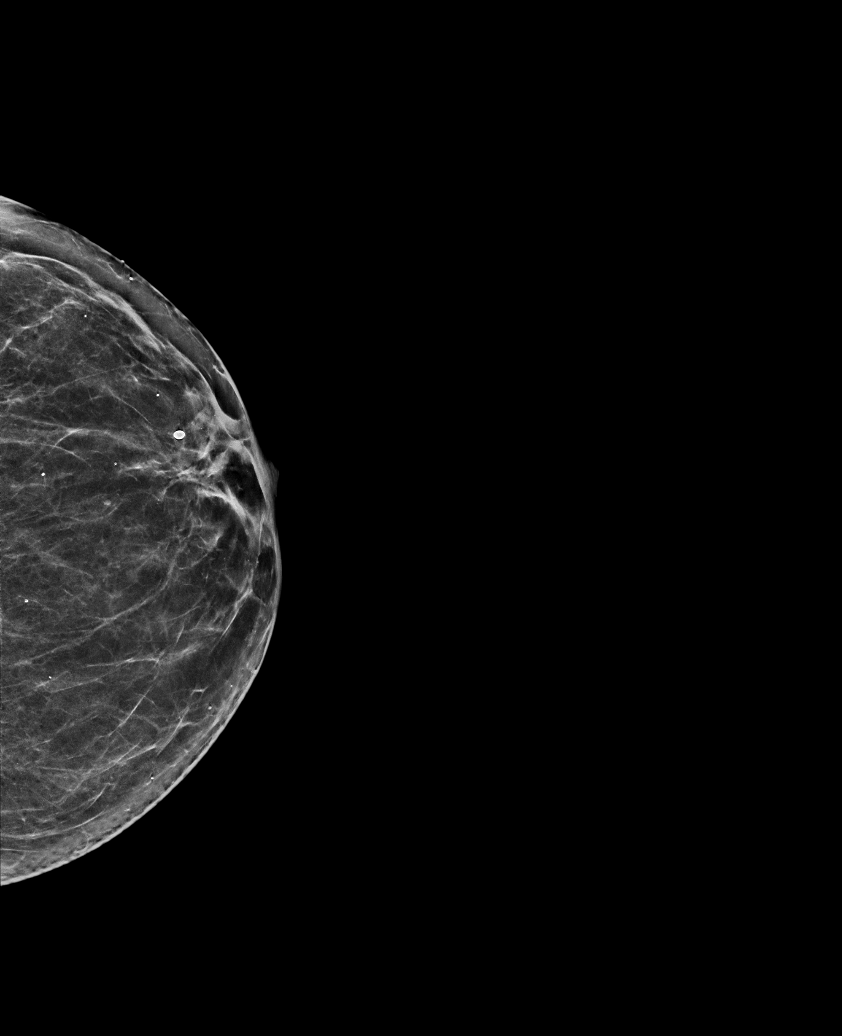

[6 of 36 positions shown; findings below may reference images not displayed]

ACR Breast Density Category b: There are scattered areas of
fibroglandular density.
FINDINGS: There are no findings suspicious for malignancy.
IMPRESSION: No mammographic evidence of malignancy. A result letter of this
screening mammogram will be mailed directly to the patient.

RECOMMENDATION:
Screening mammogram in one year. (Code:[BY])

BI-RADS CATEGORY  1: Negative.

## 2021-01-11 ENCOUNTER — Other Ambulatory Visit: Payer: Self-pay | Admitting: Family Medicine

## 2021-02-23 DIAGNOSIS — I10 Essential (primary) hypertension: Secondary | ICD-10-CM | POA: Diagnosis not present

## 2021-02-23 DIAGNOSIS — E785 Hyperlipidemia, unspecified: Secondary | ICD-10-CM | POA: Diagnosis not present

## 2021-02-23 DIAGNOSIS — E559 Vitamin D deficiency, unspecified: Secondary | ICD-10-CM | POA: Diagnosis not present

## 2021-02-24 LAB — CBC
Hematocrit: 36.5 % (ref 34.0–46.6)
Hemoglobin: 12.1 g/dL (ref 11.1–15.9)
MCH: 30.1 pg (ref 26.6–33.0)
MCHC: 33.2 g/dL (ref 31.5–35.7)
MCV: 91 fL (ref 79–97)
Platelets: 320 10*3/uL (ref 150–450)
RBC: 4.02 x10E6/uL (ref 3.77–5.28)
RDW: 11.6 % — ABNORMAL LOW (ref 11.7–15.4)
WBC: 3.9 10*3/uL (ref 3.4–10.8)

## 2021-02-24 LAB — CMP14+EGFR
ALT: 17 IU/L (ref 0–32)
AST: 21 IU/L (ref 0–40)
Albumin/Globulin Ratio: 1.6 (ref 1.2–2.2)
Albumin: 4.1 g/dL (ref 3.7–4.7)
Alkaline Phosphatase: 85 IU/L (ref 44–121)
BUN/Creatinine Ratio: 22 (ref 12–28)
BUN: 19 mg/dL (ref 8–27)
Bilirubin Total: 0.4 mg/dL (ref 0.0–1.2)
CO2: 23 mmol/L (ref 20–29)
Calcium: 10.2 mg/dL (ref 8.7–10.3)
Chloride: 103 mmol/L (ref 96–106)
Creatinine, Ser: 0.87 mg/dL (ref 0.57–1.00)
Globulin, Total: 2.6 g/dL (ref 1.5–4.5)
Glucose: 96 mg/dL (ref 65–99)
Potassium: 4.3 mmol/L (ref 3.5–5.2)
Sodium: 140 mmol/L (ref 134–144)
Total Protein: 6.7 g/dL (ref 6.0–8.5)
eGFR: 70 mL/min/{1.73_m2} (ref >59)

## 2021-02-24 LAB — LIPID PANEL
Chol/HDL Ratio: 3.5 ratio (ref 0.0–4.4)
Cholesterol, Total: 194 mg/dL (ref 100–199)
HDL: 55 mg/dL (ref >39)
LDL Chol Calc (NIH): 126 mg/dL — ABNORMAL HIGH (ref 0–99)
Triglycerides: 73 mg/dL (ref 0–149)
VLDL Cholesterol Cal: 13 mg/dL (ref 5–40)

## 2021-02-24 LAB — TSH: TSH: 0.875 u[IU]/mL (ref 0.450–4.500)

## 2021-02-24 LAB — VITAMIN D 25 HYDROXY (VIT D DEFICIENCY, FRACTURES): Vit D, 25-Hydroxy: 44.2 ng/mL (ref 30.0–100.0)

## 2021-02-26 ENCOUNTER — Other Ambulatory Visit: Payer: Self-pay | Admitting: Family Medicine

## 2021-03-01 ENCOUNTER — Other Ambulatory Visit: Payer: Self-pay

## 2021-03-01 ENCOUNTER — Encounter: Payer: Self-pay | Admitting: Family Medicine

## 2021-03-01 ENCOUNTER — Ambulatory Visit (INDEPENDENT_AMBULATORY_CARE_PROVIDER_SITE_OTHER): Payer: Medicare Other | Admitting: Family Medicine

## 2021-03-01 VITALS — BP 138/70 | HR 77 | Resp 16 | Ht 67.0 in | Wt 214.1 lb

## 2021-03-01 DIAGNOSIS — I10 Essential (primary) hypertension: Secondary | ICD-10-CM | POA: Diagnosis not present

## 2021-03-01 DIAGNOSIS — Z86010 Personal history of colonic polyps: Secondary | ICD-10-CM | POA: Diagnosis not present

## 2021-03-01 DIAGNOSIS — M1711 Unilateral primary osteoarthritis, right knee: Secondary | ICD-10-CM | POA: Diagnosis not present

## 2021-03-01 DIAGNOSIS — Z23 Encounter for immunization: Secondary | ICD-10-CM | POA: Diagnosis not present

## 2021-03-01 DIAGNOSIS — E669 Obesity, unspecified: Secondary | ICD-10-CM

## 2021-03-01 DIAGNOSIS — E785 Hyperlipidemia, unspecified: Secondary | ICD-10-CM | POA: Diagnosis not present

## 2021-03-01 NOTE — Assessment & Plan Note (Signed)
Next colonoscopy due 05/2022

## 2021-03-01 NOTE — Progress Notes (Signed)
Karen [name]     MRN: [id number]      DOB: 1946-03-08   HPI [name] is here for follow up and re-evaluation of chronic medical conditions, medication management and review of any available recent lab and radiology data.  Preventive health is updated, specifically  Cancer screening and Immunization.   Questions or concerns regarding consultations or procedures which the PT has had in the interim are  addressed. The PT denies any adverse reactions to current medications since the last visit.  1 week dry cough Bilateral knee pain, failure lose weight , motivated to do so   ROS Denies recent fever or chills. Denies sinus pressure, nasal congestion, ear pain or sore throat. Denies chest congestion, productive cough or wheezing. Denies chest pains, palpitations and leg swelling Denies abdominal pain, nausea, vomiting,diarrhea or constipation.   Denies dysuria, frequency, hesitancy or incontinence. . Denies headaches, seizures, numbness, or tingling. Denies depression, anxiety or insomnia. Denies skin break down or rash.   PE BP 138/70   Pulse 77   Resp 16   Ht 5\' 7"  (1.702 m)   Wt 214 lb 1.9 oz (97.1 kg)   SpO2 97%   BMI 33.54 kg/m    Patient alert and oriented and in no cardiopulmonary distress.  HEENT: No facial asymmetry, EOMI,     Neck supple .  Chest: Clear to auscultation bilaterally.  CVS: S1, S2 no murmurs, no S3.Regular rate.  ABD: Soft non tender.   Ext: No edema  MS: Adequate ROM spine, shoulders, hips an reduced in knees.right greater than left  Skin: Intact, no ulcerations or rash noted.  Psych: Good eye contact, normal affect. Memory intact not anxious or depressed appearing.  CNS: CN 2-12 intact, power,  normal throughout.no focal deficits noted.   Assessment & Plan  Essential hypertension, benign Controlled, no change in medication DASH diet and commitment to daily physical activity for a minimum of 30 minutes discussed and encouraged,  as a part of hypertension management. The importance of attaining a healthy weight is also discussed.  BP/Weight 03/01/2021 09/27/2020 04/27/2020 04/07/2020 11/05/2019 05/26/2019 34/19/6222  Systolic BP 979 892 119 417 408 144 818  Diastolic BP 70 88 77 77 72 75 68  Wt. (Lbs) 214.12 216 211 211.12 211 200 203  BMI 33.54 33.83 33.55 33.57 34.06 32.28 32.77       Hyperlipidemia LDL goal <100 Not at goal, no med change Hyperlipidemia:Low fat diet discussed and encouraged.   Lipid Panel  Lab Results  Component Value Date   CHOL 194 02/23/2021   HDL 55 02/23/2021   LDLCALC 126 (H) 02/23/2021   TRIG 73 02/23/2021   CHOLHDL 3.5 02/23/2021     Updated lab needed at/ before next visit.   Obesity (BMI 30.0-34.9)  Patient re-educated about  the importance of commitment to a  minimum of 150 minutes of exercise per week as able.  The importance of healthy food choices with portion control discussed, as well as eating regularly and within a 12 hour window most days. The need to choose "clean , green" food 50 to 75% of the time is discussed, as well as to make water the primary drink and set a goal of 64 ounces water daily.    Weight /BMI 03/01/2021 09/27/2020 04/27/2020  WEIGHT 214 lb 1.9 oz 216 lb 211 lb  HEIGHT 5\' 7"  5\' 7"  5' 6.5"  BMI 33.54 kg/m2 33.83 kg/m2 33.55 kg/m2      Osteoarthritis of knee Worsening,  surgery recommended, will work on weight loss, goal of 12 pounds in 5 months  Hx of adenomatous colonic polyps Next colonoscopy due 05/2022

## 2021-03-01 NOTE — Assessment & Plan Note (Signed)
Not at goal, no med change Hyperlipidemia:Low fat diet discussed and encouraged.   Lipid Panel  Lab Results  Component Value Date   CHOL 194 02/23/2021   HDL 55 02/23/2021   LDLCALC 126 (H) 02/23/2021   TRIG 73 02/23/2021   CHOLHDL 3.5 02/23/2021     Updated lab needed at/ before next visit.

## 2021-03-01 NOTE — Assessment & Plan Note (Signed)
Controlled, no change in medication DASH diet and commitment to daily physical activity for a minimum of 30 minutes discussed and encouraged, as a part of hypertension management. The importance of attaining a healthy weight is also discussed.  BP/Weight 03/01/2021 09/27/2020 04/27/2020 04/07/2020 11/05/2019 05/26/2019 04/59/1368  Systolic BP 599 234 144 360 165 800 634  Diastolic BP 70 88 77 77 72 75 68  Wt. (Lbs) 214.12 216 211 211.12 211 200 203  BMI 33.54 33.83 33.55 33.57 34.06 32.28 32.77

## 2021-03-01 NOTE — Assessment & Plan Note (Signed)
  Patient re-educated about  the importance of commitment to a  minimum of 150 minutes of exercise per week as able.  The importance of healthy food choices with portion control discussed, as well as eating regularly and within a 12 hour window most days. The need to choose "clean , green" food 50 to 75% of the time is discussed, as well as to make water the primary drink and set a goal of 64 ounces water daily.    Weight /BMI 03/01/2021 09/27/2020 04/27/2020  WEIGHT 214 lb 1.9 oz 216 lb 211 lb  HEIGHT 5\' 7"  5\' 7"  5' 6.5"  BMI 33.54 kg/m2 33.83 kg/m2 33.55 kg/m2

## 2021-03-01 NOTE — Assessment & Plan Note (Signed)
Worsening, surgery recommended, will work on weight loss, goal of 12 pounds in 5 months

## 2021-03-01 NOTE — Patient Instructions (Signed)
Follow-up in 5 months call if you need me sooner.  Flu vaccine today.  Please work on food choices increasing vegetables and fruit reducing fried foods sweets sugars starchy foods.  Your bad cholesterol is too high change in diet will address this.  No change in medication at this time.  Intentionally working on weight loss primarily by change in food choices will result in this knee pain and improved health.  Weight loss goal of 12 pounds over the next 5 months as we discussed.  Please get your Shingrix pulse vaccines and Tdap at your pharmacy.  Fasting lipids CMP and EGFR 5 days before next visit.  It is important that you exercise regularly at least 30 minutes 5 times a week. If you develop chest pain, have severe difficulty breathing, or feel very tired, stop exercising immediately and seek medical attention

## 2021-03-04 ENCOUNTER — Encounter (HOSPITAL_COMMUNITY): Payer: Self-pay

## 2021-03-04 ENCOUNTER — Other Ambulatory Visit: Payer: Self-pay

## 2021-03-04 ENCOUNTER — Emergency Department (HOSPITAL_COMMUNITY)
Admission: EM | Admit: 2021-03-04 | Discharge: 2021-03-04 | Disposition: A | Discharge status: Home / Self Care | Payer: Medicare Other | Attending: Emergency Medicine | Admitting: Emergency Medicine

## 2021-03-04 ENCOUNTER — Emergency Department (HOSPITAL_COMMUNITY): Payer: Medicare Other

## 2021-03-04 DIAGNOSIS — N200 Calculus of kidney: Secondary | ICD-10-CM | POA: Diagnosis not present

## 2021-03-04 DIAGNOSIS — K529 Noninfective gastroenteritis and colitis, unspecified: Secondary | ICD-10-CM | POA: Diagnosis not present

## 2021-03-04 DIAGNOSIS — N281 Cyst of kidney, acquired: Secondary | ICD-10-CM | POA: Diagnosis not present

## 2021-03-04 DIAGNOSIS — I10 Essential (primary) hypertension: Secondary | ICD-10-CM | POA: Insufficient documentation

## 2021-03-04 DIAGNOSIS — K5792 Diverticulitis of intestine, part unspecified, without perforation or abscess without bleeding: Secondary | ICD-10-CM | POA: Diagnosis not present

## 2021-03-04 DIAGNOSIS — R1031 Right lower quadrant pain: Secondary | ICD-10-CM | POA: Diagnosis present

## 2021-03-04 DIAGNOSIS — Z79899 Other long term (current) drug therapy: Secondary | ICD-10-CM | POA: Insufficient documentation

## 2021-03-04 DIAGNOSIS — K6389 Other specified diseases of intestine: Secondary | ICD-10-CM | POA: Diagnosis not present

## 2021-03-04 LAB — CBC WITH DIFFERENTIAL/PLATELET
Abs Immature Granulocytes: 0.03 10*3/uL (ref 0.00–0.07)
Basophils Absolute: 0 10*3/uL (ref 0.0–0.1)
Basophils Relative: 0 %
Eosinophils Absolute: 0 10*3/uL (ref 0.0–0.5)
Eosinophils Relative: 1 %
HCT: 43.6 % (ref 36.0–46.0)
Hemoglobin: 14.4 g/dL (ref 12.0–15.0)
Immature Granulocytes: 1 %
Lymphocytes Relative: 17 %
Lymphs Abs: 1 10*3/uL (ref 0.7–4.0)
MCH: 30.9 pg (ref 26.0–34.0)
MCHC: 33 g/dL (ref 30.0–36.0)
MCV: 93.6 fL (ref 80.0–100.0)
Monocytes Absolute: 0.4 10*3/uL (ref 0.1–1.0)
Monocytes Relative: 6 %
Neutro Abs: 4.4 10*3/uL (ref 1.7–7.7)
Neutrophils Relative %: 75 %
Platelets: 291 10*3/uL (ref 150–400)
RBC: 4.66 MIL/uL (ref 3.87–5.11)
RDW: 12.3 % (ref 11.5–15.5)
WBC: 5.8 10*3/uL (ref 4.0–10.5)
nRBC: 0 % (ref 0.0–0.2)

## 2021-03-04 LAB — LIPASE, BLOOD: Lipase: 31 U/L (ref 11–51)

## 2021-03-04 LAB — COMPREHENSIVE METABOLIC PANEL
ALT: 20 U/L (ref 0–44)
AST: 22 U/L (ref 15–41)
Albumin: 3.9 g/dL (ref 3.5–5.0)
Alkaline Phosphatase: 75 U/L (ref 38–126)
Anion gap: 9 (ref 5–15)
BUN: 24 mg/dL — ABNORMAL HIGH (ref 8–23)
CO2: 24 mmol/L (ref 22–32)
Calcium: 9.9 mg/dL (ref 8.9–10.3)
Chloride: 100 mmol/L (ref 98–111)
Creatinine, Ser: 0.85 mg/dL (ref 0.44–1.00)
GFR, Estimated: >60 mL/min (ref >60)
Glucose, Bld: 141 mg/dL — ABNORMAL HIGH (ref 70–99)
Potassium: 3.7 mmol/L (ref 3.5–5.1)
Sodium: 133 mmol/L — ABNORMAL LOW (ref 135–145)
Total Bilirubin: 0.6 mg/dL (ref 0.3–1.2)
Total Protein: 7.7 g/dL (ref 6.5–8.1)

## 2021-03-04 IMAGING — CT CT ABD-PELV W/ CM
2 of 5 series · 16 of 46 positions shown, 18 images · IV contrast (Omnipaque or Isovue)
Comparison: [DATE]

CLINICAL DATA: Evaluate for diverticulitis

EXAM:
CT ABDOMEN AND PELVIS WITH CONTRAST
TECHNIQUE: Multidetector CT imaging of the abdomen and pelvis was performed
using the standard protocol following bolus administration of
intravenous contrast.
CONTRAST:  100mL OMNIPAQUE IOHEXOL 300 MG/ML  SOLN

[Series 2: axial st · axial · 0.98mm/px · z∈[+1007,+1442]mm · 13 of 99 slices shown, 15 images]
[im 6/99  soft-tissue]
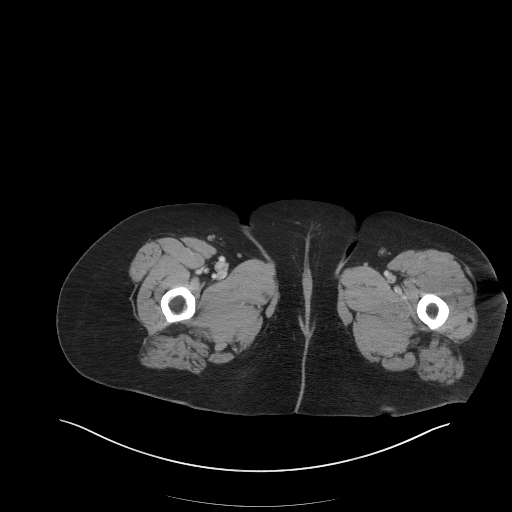
[im 6/99  bone]
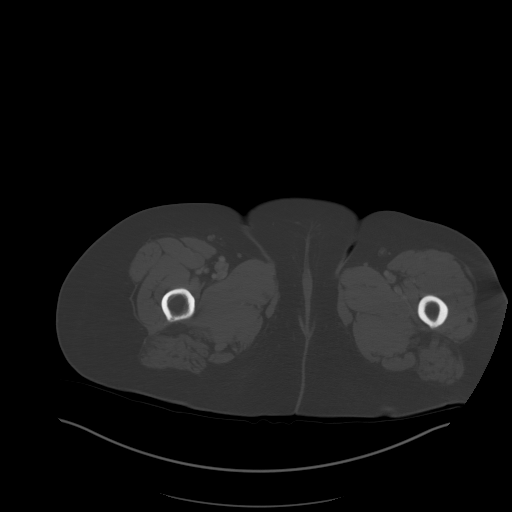
[im 12/99  soft-tissue]
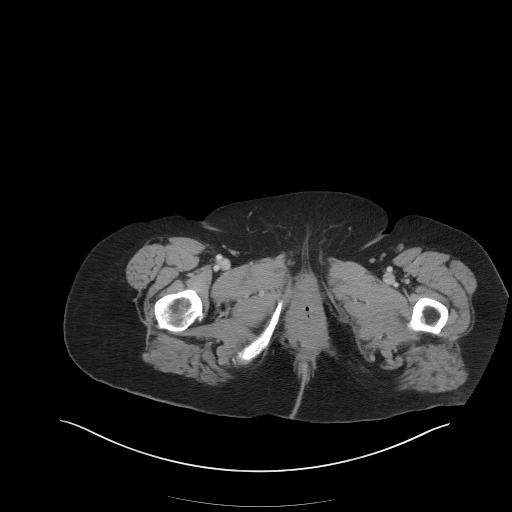
[im 24/99  soft-tissue]
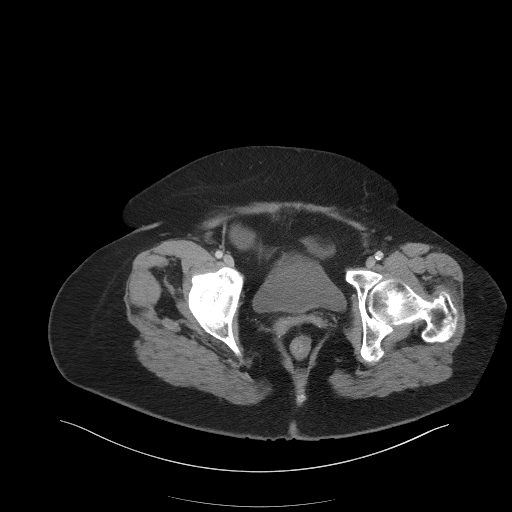
[im 29/99  soft-tissue]
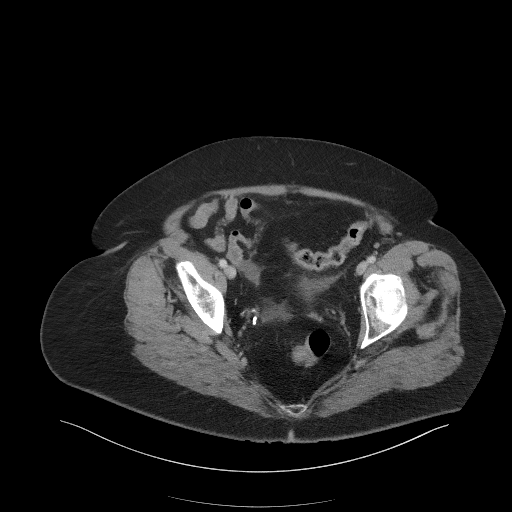
[im 35/99  soft-tissue]
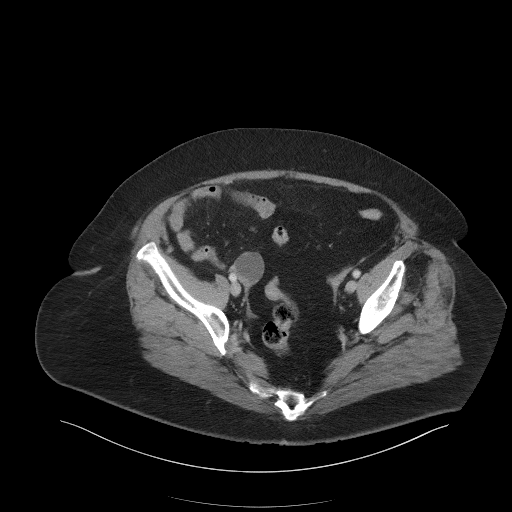
[im 41/99  soft-tissue]
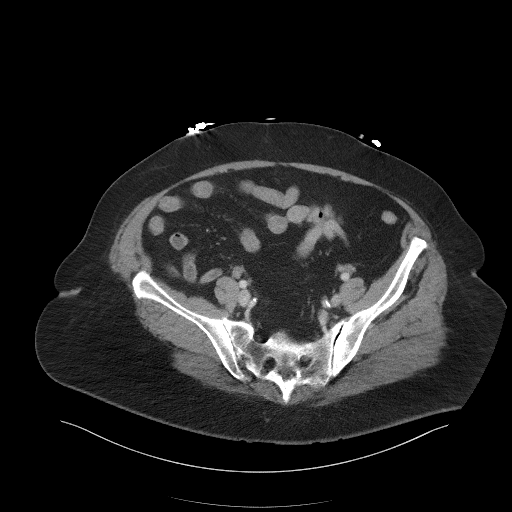
[im 52/99  soft-tissue]
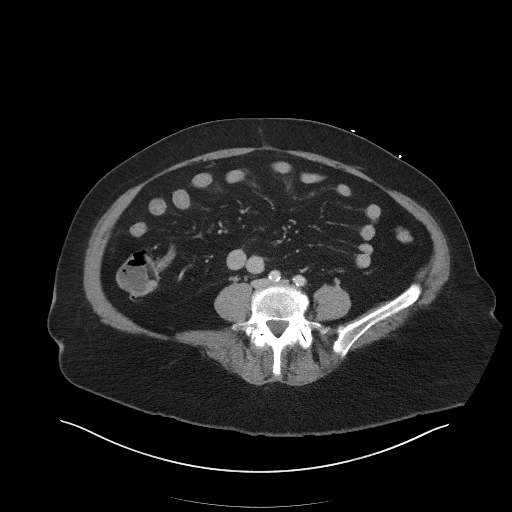
[im 58/99  soft-tissue]
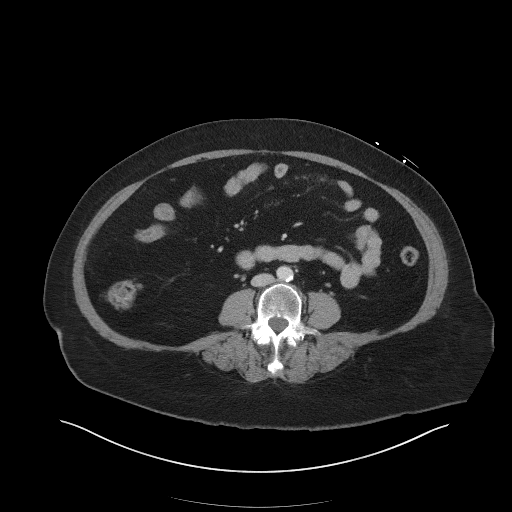
[im 64/99  soft-tissue]
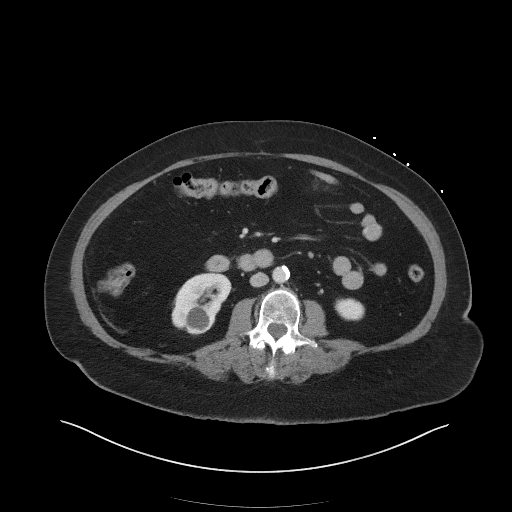
[im 64/99  bone]
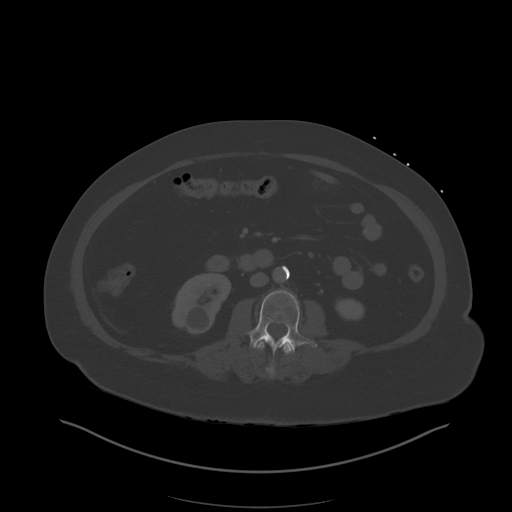
[im 70/99  soft-tissue]
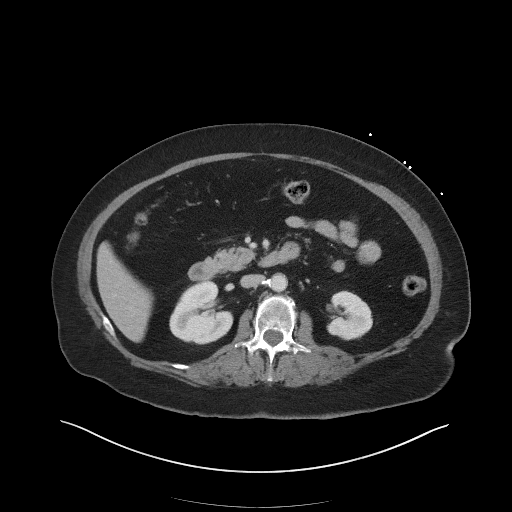
[im 75/99  soft-tissue]
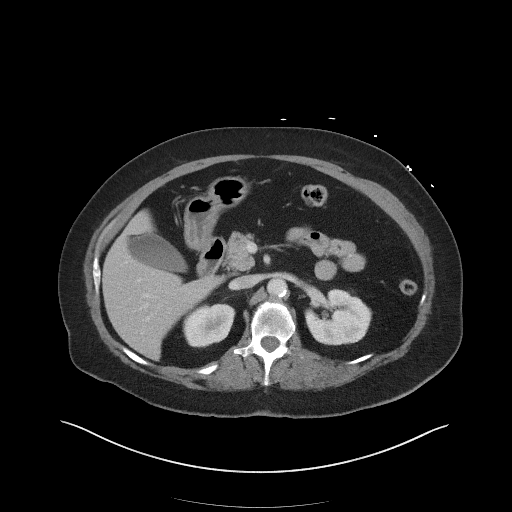
[im 87/99  soft-tissue]
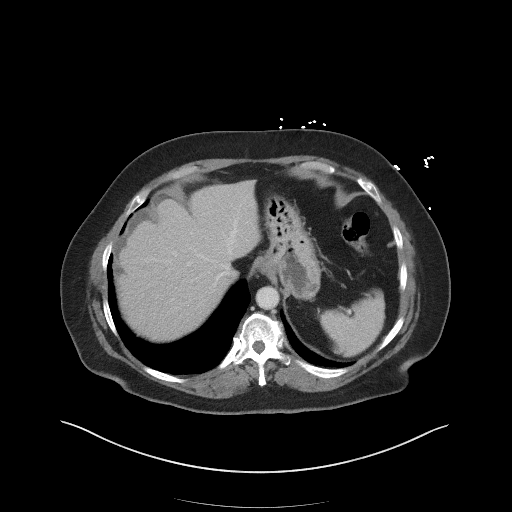
[im 93/99  soft-tissue]
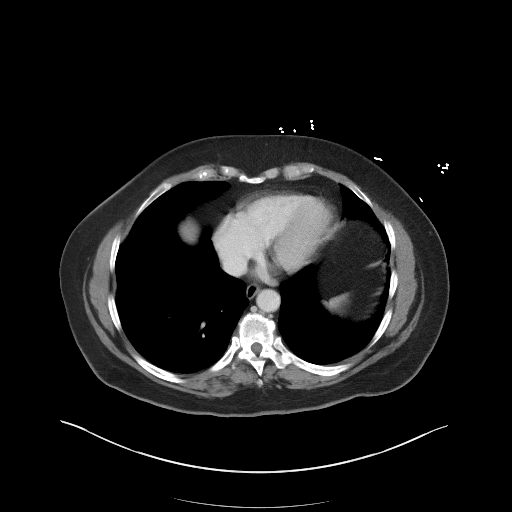

[Series 5: coronal st · coronal · 0.90mm/px · 3 of 113 slices shown]
[im 38/113  soft-tissue]
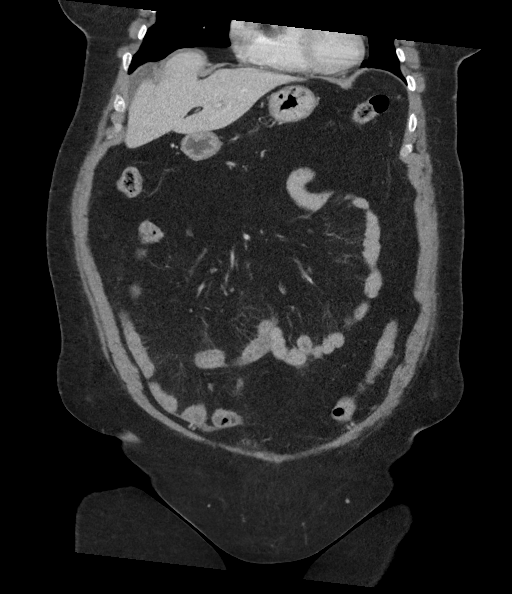
[im 50/113  soft-tissue]
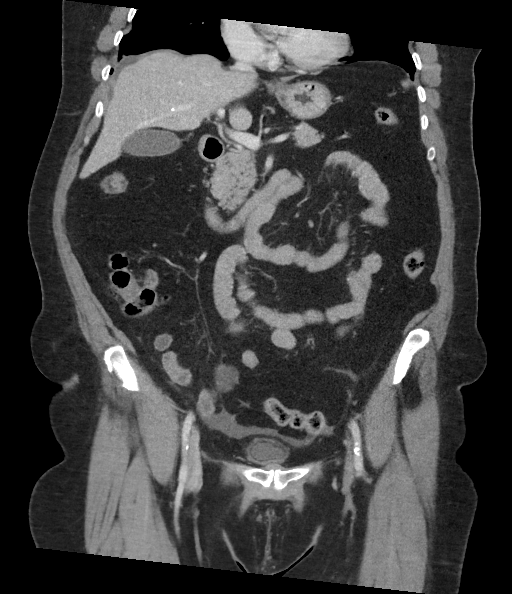
[im 63/113  soft-tissue]
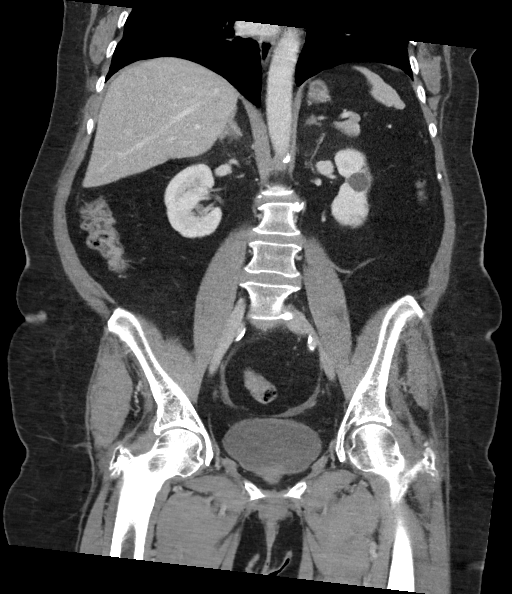

[16 of 46 positions shown; findings below may reference images not displayed]

FINDINGS: Lower chest: No acute abnormality.

Hepatobiliary: No focal liver abnormality is seen. No gallstones,
gallbladder wall thickening, or biliary dilatation.

Pancreas: Unremarkable. No pancreatic ductal dilatation or
surrounding inflammatory changes.

Spleen: Normal in size without focal abnormality.

Adrenals/Urinary Tract: Normal adrenal glands. Small bilateral renal
calculi noted. Renal cysts noted. The largest is in the inferior
pole left kidney measuring 2 cm. No suspicious mass or
hydronephrosis. The urinary bladder is unremarkable.

Stomach/Bowel: Stomach normal. Proximal small bowel loops are
unremarkable. There is diffuse wall thickening with surrounding
inflammatory fat stranding and hyperemia of the Vasa recta involving
the mid and distal small bowel loops compatible with enteritis.
Sparing of the terminal ileum noted. Partially decompressed colon is
unremarkable.

Vascular/Lymphatic: Aortic atherosclerosis. No aneurysm. No
abdominopelvic adenopathy

Reproductive: Hysterectomy. Right ovary cyst measures 2.7 by 2.4 cm,
image 65/2. Normal left ovary.

Other: Small volume of perihepatic fluid. There is also trace
ascites within the right posterior pelvis. No fluid collection. No
signs of pneumoperitoneum.

Musculoskeletal: No acute or significant osseous findings. L5-S1
degenerative disc disease.
IMPRESSION: 1. Examination is positive for diffuse wall thickening with
surrounding inflammatory fat stranding and hyperemia of the Vasa
recta involving the mid and distal small bowel loops compatible with
enteritis. No evidence for bowel obstruction, perforation or
abscess.
2. Bilateral nonobstructing renal calculi.
3. Right ovary cyst. No follow-up imaging recommended. Note: This
recommendation does not apply to premenarchal patients and to those
with increased risk (genetic, family history, elevated tumor markers
or other high-risk factors) of ovarian cancer. Reference: JACR [I8]
4. Aortic Atherosclerosis ([I8]-[I8]).

## 2021-03-04 MED ORDER — TRAMADOL HCL 50 MG PO TABS: 50.0000 mg | ORAL_TABLET | Freq: Four times a day (QID) | ORAL | 0 refills | Status: DC | PRN

## 2021-03-04 MED ORDER — HYDROMORPHONE HCL 1 MG/ML IJ SOLN
0.5000 mg | Freq: Once | INTRAMUSCULAR | Status: AC
  Administered 2021-03-04: 0.5 mg via INTRAVENOUS
  Filled 2021-03-04: qty 1

## 2021-03-04 MED ORDER — IOHEXOL 300 MG/ML  SOLN
100.0000 mL | Freq: Once | INTRAMUSCULAR | Status: AC | PRN
  Administered 2021-03-04: 100 mL via INTRAVENOUS

## 2021-03-04 MED ORDER — ONDANSETRON HCL 4 MG/2ML IJ SOLN
4.0000 mg | Freq: Once | INTRAMUSCULAR | Status: AC
  Administered 2021-03-04: 4 mg via INTRAVENOUS
  Filled 2021-03-04: qty 2

## 2021-03-04 NOTE — Discharge Instructions (Addendum)
Follow-up with your doctor as needed.  Take the tramadol to help with the crampy abdominal pain.  You may develop some diarrhea based on these findings.  If you have passage of blood or increased pain or worse symptoms return for reevaluation.

## 2021-03-04 NOTE — ED Provider Notes (Addendum)
[facility] EMERGENCY DEPARTMENT Provider Note   CSN: [id number] Arrival date & time: 03/04/21  2952     History Chief Complaint  Patient presents with   Abdominal Pain    Karen [name] is a 75 y.o. female.  Patient with acute onset of crampy bilateral lower quadrant abdominal pain starting at about 1730 yesterday.  It has been intermittent but recurrent.  Associated with nausea no vomiting no diarrhea.  Patient is never had a thing like this before.      Past Medical History:  Diagnosis Date   Arthritis    C. difficile colitis Jul 18, 2013   Vancomycin. Recheck Cdiff positive. Vancomycin continued for an additional 2 weeks   H/O herpes zoster    Hyperlipidemia    Hypertension    Metabolic syndrome X    Pes anserinus tendinitis 11/22/2011   Prediabetes 2014   lifestyle    Prediabetes     Patient Active Problem List   Diagnosis Date Noted   Back pain with right-sided radiculopathy 04/07/2020   Vitamin D deficiency 11/07/2019   Posterior left knee pain 05/03/2019   Varicose veins of both lower extremities 08/13/2017   Osteopenia 12/21/2014   Seasonal allergies 06/07/2014   Goiter, nontoxic, multinodular 07/31/2013   Hx of adenomatous colonic polyps 84/13/2440   Metabolic syndrome X 04/08/2535   Osteoarthritis of knee 11/22/2011   Hyperlipidemia LDL goal <100 01/02/2008   Obesity (BMI 30.0-34.9) 01/02/2008   Essential hypertension, benign 01/02/2008    Past Surgical History:  Procedure Laterality Date   ABDOMINAL HYSTERECTOMY  1983 approx   fibroids, partial   ANKLE SURGERY     COLONOSCOPY  04/27/2005   UYQ:[id number] polyp at the splenic flexure cold biopsied/removed. Mid  descending polyp removed with cold snare technique. The remainder of the of the colon looked normal/ Normal rectum. inflammed adenomatous polyps.   COLONOSCOPY N/A 05/26/2013   Dr. Rourk:Multiple colonic polyps removed, tubular adenomas. Needs surveillance Dec 2017.    COLONOSCOPY N/A  05/24/2017   Procedure: COLONOSCOPY;  Surgeon: Daneil Dolin, MD;  Location: [facility] ENDO SUITE;  Service: Endoscopy;  Laterality: N/A;  1:00 pm   FRACTURE SURGERY Right mid 1990's   pin put in and waas removed     OB History     Gravida  4   Para  3   Term  3   Preterm      AB  1   Living  3      SAB  1   IAB      Ectopic      Multiple      Live Births              Family History  Problem Relation Age of Onset   Hypertension Mother    Lung cancer Mother    Hypertension Father    Hypertension Sister    Hypertension Brother    Hypertension Sister    Diabetes Sister    Hypertension Son    Hypertension Son    Colon cancer Neg Hx     Social History   Tobacco Use   Smoking status: Never   Smokeless tobacco: Never  Vaping Use   Vaping Use: Never used  Substance Use Topics   Alcohol use: No   Drug use: No    Home Medications Prior to Admission medications   Medication Sig Start Date End Date Taking? Authorizing Provider  traMADol (ULTRAM) 50 MG tablet Take 1 tablet (50 mg  total) by mouth every 6 (six) hours as needed for moderate pain. 03/04/21  Yes Fredia Sorrow, MD  Ascorbic Acid (VITAMIN C) 1000 MG tablet Take 1,000 mg by mouth daily.    [provider]  Cholecalciferol 25 MCG (1000 UT) tablet Take 2,000 Units by mouth daily.     [provider]  Coenzyme Q10 (CO Q 10 PO) Take 1 tablet by mouth daily.     [provider]  levocetirizine (XYZAL) 5 MG tablet Take 5 mg by mouth every evening.    [provider]  lisinopril-hydrochlorothiazide (ZESTORETIC) 20-25 MG tablet Take 1 tablet by mouth once daily 02/28/21   Fayrene Helper, MD  simvastatin (ZOCOR) 10 MG tablet TAKE 1 TABLET BY MOUTH ONCE DAILY - STOP ZETIA 01/11/21   Fayrene Helper, MD  UNABLE TO FIND Bilateral compression hose x 1 pair 15-20 mm/hg 08/13/17   Fayrene Helper, MD    Allergies    Amlodipine, Triamterene, Other, and  Penicillins  Review of Systems   Review of Systems  Constitutional:  Negative for chills and fever.  HENT:  Negative for ear pain and sore throat.   Eyes:  Negative for pain and visual disturbance.  Respiratory:  Negative for cough and shortness of breath.   Cardiovascular:  Negative for chest pain and palpitations.  Gastrointestinal:  Positive for abdominal pain and nausea. Negative for vomiting.  Genitourinary:  Negative for dysuria and hematuria.  Musculoskeletal:  Negative for arthralgias and back pain.  Skin:  Negative for color change and rash.  Neurological:  Negative for seizures and syncope.  All other systems reviewed and are negative.  Physical Exam Updated Vital Signs BP (!) 121/55   Pulse 79   Temp 98.4 F (36.9 C) (Oral)   Resp 12   Ht 1.702 m (5\' 7" )   Wt 96.6 kg   SpO2 93%   BMI 33.36 kg/m   Physical Exam Vitals and nursing note reviewed.  Constitutional:      General: She is not in acute distress.    Appearance: Normal appearance. She is well-developed.  HENT:     Head: Normocephalic and atraumatic.  Eyes:     Conjunctiva/sclera: Conjunctivae normal.  Cardiovascular:     Rate and Rhythm: Normal rate and regular rhythm.     Heart sounds: No murmur heard. Pulmonary:     Effort: Pulmonary effort is normal. No respiratory distress.     Breath sounds: Normal breath sounds.  Abdominal:     General: There is no distension.     Palpations: Abdomen is soft.     Tenderness: There is no abdominal tenderness. There is no guarding.  Musculoskeletal:        General: No swelling. Normal range of motion.     Cervical back: Neck supple.  Skin:    General: Skin is warm and dry.  Neurological:     General: No focal deficit present.     Mental Status: She is alert and oriented to person, place, and time.    ED Results / Procedures / Treatments   Labs (all labs ordered are listed, but only abnormal results are displayed) Labs Reviewed  COMPREHENSIVE METABOLIC  PANEL - Abnormal; Notable for the following components:      Result Value   Sodium 133 (*)    Glucose, Bld 141 (*)    BUN 24 (*)    All other components within normal limits  CBC WITH DIFFERENTIAL/PLATELET  LIPASE, BLOOD  EKG None  Radiology CT Abdomen Pelvis W Contrast  Result Date: 03/04/2021 CLINICAL DATA:  Evaluate for diverticulitis EXAM: CT ABDOMEN AND PELVIS WITH CONTRAST TECHNIQUE: Multidetector CT imaging of the abdomen and pelvis was performed using the standard protocol following bolus administration of intravenous contrast. CONTRAST:  18mL OMNIPAQUE IOHEXOL 300 MG/ML  SOLN COMPARISON:  07/31/2013 FINDINGS: Lower chest: No acute abnormality. Hepatobiliary: No focal liver abnormality is seen. No gallstones, gallbladder wall thickening, or biliary dilatation. Pancreas: Unremarkable. No pancreatic ductal dilatation or surrounding inflammatory changes. Spleen: Normal in size without focal abnormality. Adrenals/Urinary Tract: Normal adrenal glands. Small bilateral renal calculi noted. Renal cysts noted. The largest is in the inferior pole left kidney measuring 2 cm. No suspicious mass or hydronephrosis. The urinary bladder is unremarkable. Stomach/Bowel: Stomach normal. Proximal small bowel loops are unremarkable. There is diffuse wall thickening with surrounding inflammatory fat stranding and hyperemia of the Vasa recta involving the mid and distal small bowel loops compatible with enteritis. Sparing of the terminal ileum noted. Partially decompressed colon is unremarkable. Vascular/Lymphatic: Aortic atherosclerosis. No aneurysm. No abdominopelvic adenopathy Reproductive: Hysterectomy. Right ovary cyst measures 2.7 by 2.4 cm, image 65/2. Normal left ovary. Other: Small volume of perihepatic fluid. There is also trace ascites within the right posterior pelvis. No fluid collection. No signs of pneumoperitoneum. Musculoskeletal: No acute or significant osseous findings. L5-S1 degenerative disc  disease. IMPRESSION: 1. Examination is positive for diffuse wall thickening with surrounding inflammatory fat stranding and hyperemia of the Vasa recta involving the mid and distal small bowel loops compatible with enteritis. No evidence for bowel obstruction, perforation or abscess. 2. Bilateral nonobstructing renal calculi. 3. Right ovary cyst. No follow-up imaging recommended. Note: This recommendation does not apply to premenarchal patients and to those with increased risk (genetic, family history, elevated tumor markers or other high-risk factors) of ovarian cancer. Reference: JACR 2020 Feb; 17(2):248-254 4. Aortic Atherosclerosis (ICD10-I70.0). Electronically Signed   By: Kerby Moors M.D.   On: 03/04/2021 09:17    Procedures Procedures   Medications Ordered in ED Medications  ondansetron (ZOFRAN) injection 4 mg (4 mg Intravenous Given 03/04/21 0744)  HYDROmorphone (DILAUDID) injection 0.5 mg (0.5 mg Intravenous Given 03/04/21 0744)  iohexol (OMNIPAQUE) 300 MG/ML solution 100 mL (100 mLs Intravenous Contrast Given 03/04/21 0844)    ED Course  I have reviewed the triage vital signs and the nursing notes.  Pertinent labs & imaging results that were available during my care of the patient were reviewed by me and considered in my medical decision making (see chart for details).    MDM Rules/Calculators/A&P                           Symptoms could be associated with diverticulitis.  Will get CBC complete metabolic panel and CT scan of the abdomen to evaluate further.  No evidence of any diverticulitis.  CT findings consistent with some enteritis.  But patient not having any diarrhea at this point in time.  We will treat with tramadol to help with the crampy abdominal pain.  Have her follow-up with her primary care doctor.  She will return for any new or worse symptoms.  Final Clinical Impression(s) / ED Diagnoses Final diagnoses:  Enteritis    Rx / DC Orders ED Discharge Orders           Ordered    traMADol (ULTRAM) 50 MG tablet  Every 6 hours PRN  03/04/21 1007             Fredia Sorrow, MD 03/04/21 4742    Fredia Sorrow, MD 03/04/21 1009

## 2021-03-04 NOTE — ED Triage Notes (Signed)
Pt to er, pt states that she has had a cough for the past week, states that it has made er sore, states that she is here today because last night she had some spicy food and has had some abd pain and upset ever since.

## 2021-04-14 ENCOUNTER — Other Ambulatory Visit: Payer: Self-pay | Admitting: Family Medicine

## 2021-05-12 ENCOUNTER — Ambulatory Visit (INDEPENDENT_AMBULATORY_CARE_PROVIDER_SITE_OTHER): Payer: Medicare Other

## 2021-05-12 ENCOUNTER — Other Ambulatory Visit: Payer: Self-pay

## 2021-05-12 DIAGNOSIS — Z0000 Encounter for general adult medical examination without abnormal findings: Secondary | ICD-10-CM

## 2021-05-12 NOTE — Progress Notes (Signed)
I connected with  Karen [name] on 05/12/21 by a audio enabled telemedicine application and verified that I am speaking with the correct person using two identifiers.  Patient Location: Home  Provider Location: Office/Clinic  I discussed the limitations of evaluation and management by telemedicine. The patient expressed understanding and agreed to proceed.   Subjective:   Karen [name] is a 75 y.o. female who presents for Medicare Annual (Subsequent) preventive examination.  Review of Systems     Cardiac Risk Factors include: advanced age (>57men, >60 women);dyslipidemia;hypertension;obesity (BMI >30kg/m2)     Objective:    There were no vitals filed for this visit. There is no height or weight on file to calculate BMI.  Advanced Directives 05/12/2021 03/04/2021 04/27/2020 04/22/2018 05/24/2017 04/11/2017 07/18/2013  Does Patient Have a Medical Advance Directive? No No No No No No Patient does not have advance directive  Would patient like information on creating a medical advance directive? Yes (ED - Information included in AVS) - No - Patient declined Yes (ED - Information included in AVS) No - Patient declined Yes (MAU/Ambulatory/Procedural Areas - Information given) -  Pre-existing out of facility DNR order (yellow form or pink MOST form) - - - - - - No    Current Medications (verified) Outpatient Encounter Medications as of 05/12/2021  Medication Sig   Ascorbic Acid (VITAMIN C) 1000 MG tablet Take 1,000 mg by mouth daily.   Cholecalciferol 25 MCG (1000 UT) tablet Take 2,000 Units by mouth daily.    Coenzyme Q10 (CO Q 10 PO) Take 1 tablet by mouth daily.    levocetirizine (XYZAL) 5 MG tablet Take 5 mg by mouth every evening.   lisinopril-hydrochlorothiazide (ZESTORETIC) 20-25 MG tablet Take 1 tablet by mouth once daily   simvastatin (ZOCOR) 10 MG tablet TAKE 1 TABLET BY MOUTH ONCE DAILY **STOP  ZETIA**   traMADol (ULTRAM) 50 MG tablet Take 1 tablet (50 mg total) by mouth  every 6 (six) hours as needed for moderate pain.   UNABLE TO FIND Bilateral compression hose x 1 pair 15-20 mm/hg   No facility-administered encounter medications on file as of 05/12/2021.    Allergies (verified) Amlodipine, Triamterene, Other, and Penicillins   History: Past Medical History:  Diagnosis Date   Arthritis    C. difficile colitis Jul 18, 2013   Vancomycin. Recheck Cdiff positive. Vancomycin continued for an additional 2 weeks   H/O herpes zoster    Hyperlipidemia    Hypertension    Metabolic syndrome X    Pes anserinus tendinitis 11/22/2011   Prediabetes 2014   lifestyle    Prediabetes    Past Surgical History:  Procedure Laterality Date   ABDOMINAL HYSTERECTOMY  1983 approx   fibroids, partial   ANKLE SURGERY     COLONOSCOPY  04/27/2005   [id number] polyp at the splenic flexure cold biopsied/removed. Mid  descending polyp removed with cold snare technique. The remainder of the of the colon looked normal/ Normal rectum. inflammed adenomatous polyps.   COLONOSCOPY N/A 05/26/2013   Dr. Rourk:Multiple colonic polyps removed, tubular adenomas. Needs surveillance Dec 2017.    COLONOSCOPY N/A 05/24/2017   Procedure: COLONOSCOPY;  Surgeon: Daneil Dolin, MD;  Location: [facility];  Service: Endoscopy;  Laterality: N/A;  1:00 pm   FRACTURE SURGERY Right mid 1990's   pin put in and waas removed   Family History  Problem Relation Age of Onset   Hypertension Mother    Lung cancer Mother  Hypertension Father    Hypertension Sister    Hypertension Brother    Hypertension Sister    Diabetes Sister    Hypertension Son    Hypertension Son    Colon cancer Neg Hx    Social History   Socioeconomic History   Marital status: Married    Spouse name: Not on file   Number of children: 3   Years of education: 12   Highest education level: 12th grade  Occupational History   Occupation: fulltime - caregiver   Tobacco Use   Smoking status: Never   Smokeless  tobacco: Never  Vaping Use   Vaping Use: Never used  Substance and Sexual Activity   Alcohol use: No   Drug use: No   Sexual activity: Not Currently    Birth control/protection: Surgical  Other Topics Concern   Not on file  Social History Narrative   Lives with husband alone, marriage is strained    Social Determinants of Health   Financial Resource Strain: Low Risk    Difficulty of Paying Living Expenses: Not very hard  Food Insecurity: No Food Insecurity   Worried About Charity fundraiser in the Last Year: Never true   [place] in the Last Year: Never true  Transportation Needs: Unknown   Lack of Transportation (Medical): No   Lack of Transportation (Non-Medical): Not on file  Physical Activity: Not on file  Stress: No Stress Concern Present   Feeling of Stress : Not at all  Social Connections: Socially Integrated   Frequency of Communication with Friends and Family: More than three times a week   Frequency of Social Gatherings with Friends and Family: More than three times a week   Attends Religious Services: More than 4 times per year   Active Member of [employer] or Organizations: Yes   Attends Music therapist: More than 4 times per year   Marital Status: Married    Tobacco Counseling Counseling given: Not Answered   Clinical Intake:  Pre-visit preparation completed: No  Pain : No/denies pain     Nutritional Status: BMI > 30  Obese Diabetes: No  How often do you need to have someone help you when you read instructions, pamphlets, or other written materials from your doctor or pharmacy?: 1 - Never What is the last grade level you completed in school?: 12  Diabetic? no         Activities of Daily Living In your present state of health, do you have any difficulty performing the following activities: 05/12/2021  Hearing? N  Vision? N  Difficulty concentrating or making decisions? N  Walking or climbing stairs? N  Dressing or bathing?  N  Doing errands, shopping? N  Preparing Food and eating ? N  Using the Toilet? N  In the past six months, have you accidently leaked urine? N  Do you have problems with loss of bowel control? N  Managing your Medications? N  Managing your Finances? N  Some recent data might be hidden    Patient Care Team: Fayrene Helper, MD as PCP - General Bronson Ing Lenise Herald, MD (Inactive) as PCP - Cardiology (Cardiology) Gala Romney Cristopher Estimable, MD as Consulting Physician (Gastroenterology)  Indicate any recent Medical Services you may have received from other than [facility] providers in the past year (date may be approximate).     Assessment:   This is a routine wellness examination for [place].  Hearing/Vision screen No results found.  Dietary  issues and exercise activities discussed: Current Exercise Habits: Home exercise routine, Type of exercise: stretching, Time (Minutes): 20, Frequency (Times/Week): 3, Weekly Exercise (Minutes/Week): 60, Intensity: Mild, Exercise limited by: orthopedic condition(s)   Goals Addressed             This Visit's Progress    Exercise 3x per week (30 min per time)   On track    Recommend you increase your exercise program at least 3 days a week for 30-45 minutes at a time as tolerated.       Patient Stated       Get surgery on torn meniscus to walk better       Depression Screen PHQ 2/9 Scores 05/12/2021 03/01/2021 04/27/2020 04/27/2020 04/07/2020 11/05/2019 04/24/2019  PHQ - 2 Score 0 0 0 0 0 0 1  PHQ- 9 Score - - - - - - -    Fall Risk Fall Risk  05/12/2021 03/01/2021 09/27/2020 04/27/2020 04/07/2020  Falls in the past year? 0 0 0 0 0  Number falls in past yr: 0 0 0 0 -  Injury with Fall? 0 0 0 0 -  Risk for fall due to : - - - No Fall Risks -  Risk for fall due to: Comment - - - - -  Follow up - - - Falls evaluation completed -    FALL RISK PREVENTION PERTAINING TO THE HOME:  Any stairs in or around the home? No  If so, are there any without  handrails? No  Home free of loose throw rugs in walkways, pet beds, electrical cords, etc? Yes  Adequate lighting in your home to reduce risk of falls? Yes   ASSISTIVE DEVICES UTILIZED TO PREVENT FALLS:  Life alert? No  Use of a cane, walker or w/c? No  Grab bars in the bathroom? Yes  Shower chair or bench in shower? Yes  Elevated toilet seat or a handicapped toilet? No    Cognitive Function:     6CIT Screen 04/27/2020 04/24/2019 04/22/2018 04/11/2017  What Year? 0 points 0 points 0 points 0 points  What month? 0 points 0 points 0 points 0 points  What time? 0 points 0 points 3 points 0 points  Count back from 20 0 points 0 points 0 points 0 points  Months in reverse 0 points 0 points 0 points 0 points  Repeat phrase 0 points 0 points 0 points 0 points  Total Score 0 0 3 0    Immunizations Immunization History  Administered Date(s) Administered   Fluad Quad(high Dose 65+) 02/11/2019, 03/01/2021   H1N1 04/17/2008   Influenza Whole 03/29/2006, 07/30/2008, 03/26/2009   Influenza,inj,Quad PF,6+ Mos 02/20/2013, 03/23/2014, 04/07/2015, 03/21/2016, 02/13/2017, 02/14/2018   Influenza-Unspecified 03/17/2020   Moderna SARS-COV2 Booster Vaccination 05/01/2020, 12/24/2020   Moderna Sars-Covid-2 Vaccination 07/17/2019, 08/18/2019, 05/01/2021   Pneumococcal Conjugate-13 07/16/2014   Pneumococcal Polysaccharide-23 02/20/2013   Td 04/27/2004   Varicella 02/22/2013    TDAP status: Due, Education has been provided regarding the importance of this vaccine. Advised may receive this vaccine at local pharmacy or Health Dept. Aware to provide a copy of the vaccination record if obtained from local pharmacy or Health Dept. Verbalized acceptance and understanding.  Flu Vaccine status: Up to date  Pneumococcal vaccine status: Up to date  Covid-19 vaccine status: Completed vaccines  Qualifies for Shingles Vaccine? Yes   Zostavax completed No   Shingrix Completed?: Yes  Screening  Tests Health Maintenance  Topic Date Due   Zoster Vaccines-  Shingrix (1 of 2) Never done   TETANUS/TDAP  04/27/2014   COLONOSCOPY (Pts 45-71yrs Insurance coverage will need to be confirmed)  05/26/2022 (Originally 05/24/2020)   COVID-19 Vaccine (4 - Booster for Moderna series) 06/26/2021   MAMMOGRAM  12/24/2022   Pneumonia Vaccine 68+ Years old  Completed   INFLUENZA VACCINE  Completed   DEXA SCAN  Completed   Hepatitis C Screening  Completed   HPV VACCINES  Aged Out    Health Maintenance  Health Maintenance Due  Topic Date Due   Zoster Vaccines- Shingrix (1 of 2) Never done   TETANUS/TDAP  04/27/2014    Colorectal cancer screening: Type of screening: Colonoscopy. Completed 2018. Repeat every 5 years  Mammogram status: Completed yes. Repeat every year  Bone Density status: Ordered yes. Pt provided with contact info and advised to call to schedule appt.  Lung Cancer Screening: (Low Dose CT Chest recommended if Age 34-80 years, 30 pack-year currently smoking OR have quit w/in 15years.) does not qualify.   Lung Cancer Screening Referral: no  Additional Screening:  Hepatitis C Screening: does not qualify; Completed   Vision Screening: Recommended annual ophthalmology exams for early detection of glaucoma and other disorders of the eye. Is the patient up to date with their annual eye exam?  No  Who is the provider or what is the name of the office in which the patient attends annual eye exams? Dr Radford Pax If pt is not established with a provider, would they like to be referred to a provider to establish care? No .   Dental Screening: Recommended annual dental exams for proper oral hygiene  Community Resource Referral / Chronic Care Management: CRR required this visit?  No   CCM required this visit?  No      Plan:     I have personally reviewed and noted the following in the patient's chart:   Medical and social history Use of alcohol, tobacco or illicit drugs   Current medications and supplements including opioid prescriptions.  Functional ability and status Nutritional status Physical activity Advanced directives List of other physicians Hospitalizations, surgeries, and ER visits in previous 12 months Vitals Screenings to include cognitive, depression, and falls Referrals and appointments  In addition, I have reviewed and discussed with patient certain preventive protocols, quality metrics, and best practice recommendations. A written personalized care plan for preventive services as well as general preventive health recommendations were provided to patient.     Kate Sable, LPN, LPN   55/12/3218   Nurse Notes:  Ms. Dilger , Thank you for taking time to come for your Medicare Wellness Visit. I appreciate your ongoing commitment to your health goals. Please review the following plan we discussed and let me know if I can assist you in the future.   These are the goals we discussed:  Goals      Exercise 3x per week (30 min per time)     Recommend you increase your exercise program at least 3 days a week for 30-45 minutes at a time as tolerated.       Patient Stated     Get surgery on torn meniscus to walk better     Weight (lb) < 200 lb (90.7 kg)        This is a list of the screening recommended for you and due dates:  Health Maintenance  Topic Date Due   Zoster (Shingles) Vaccine (1 of 2) Never done   Tetanus Vaccine  04/27/2014  Colon Cancer Screening  05/26/2022*   COVID-19 Vaccine (4 - Booster for Moderna series) 06/26/2021   Mammogram  12/24/2022   Pneumonia Vaccine  Completed   Flu Shot  Completed   DEXA scan (bone density measurement)  Completed   Hepatitis C Screening: USPSTF Recommendation to screen - Ages 75-79 yo.  Completed   HPV Vaccine  Aged Out  *Topic was postponed. The date shown is not the original due date.

## 2021-05-12 NOTE — Patient Instructions (Addendum)
Ms. Haymaker , Thank you for taking time to come for your Medicare Wellness Visit. I appreciate your ongoing commitment to your health goals. Please review the following plan we discussed and let me know if I can assist you in the future.   These are the goals we discussed:  Goals      Exercise 3x per week (30 min per time)     Recommend you increase your exercise program at least 3 days a week for 30-45 minutes at a time as tolerated.       Patient Stated     Get surgery on torn meniscus to walk better     Weight (lb) < 200 lb (90.7 kg)        This is a list of the screening recommended for you and due dates:  Health Maintenance  Topic Date Due   Zoster (Shingles) Vaccine (1 of 2) Never done   Tetanus Vaccine  04/27/2014   Colon Cancer Screening  05/26/2022*   COVID-19 Vaccine (4 - Booster for Moderna series) 06/26/2021   Mammogram  12/24/2022   Pneumonia Vaccine  Completed   Flu Shot  Completed   DEXA scan (bone density measurement)  Completed   Hepatitis C Screening: USPSTF Recommendation to screen - Ages 6-79 yo.  Completed   HPV Vaccine  Aged Out  *Topic was postponed. The date shown is not the original due date.      Health Maintenance, Female Adopting a healthy lifestyle and getting preventive care are important in promoting health and wellness. Ask your health care provider about: The right schedule for you to have regular tests and exams. Things you can do on your own to prevent diseases and keep yourself healthy. What should I know about diet, weight, and exercise? Eat a healthy diet  Eat a diet that includes plenty of vegetables, fruits, low-fat dairy products, and lean protein. Do not eat a lot of foods that are high in solid fats, added sugars, or sodium. Maintain a healthy weight Body mass index (BMI) is used to identify weight problems. It estimates body fat based on height and weight. Your health care provider can help determine your BMI and help you  achieve or maintain a healthy weight. Get regular exercise Get regular exercise. This is one of the most important things you can do for your health. Most adults should: Exercise for at least 150 minutes each week. The exercise should increase your heart rate and make you sweat (moderate-intensity exercise). Do strengthening exercises at least twice a week. This is in addition to the moderate-intensity exercise. Spend less time sitting. Even light physical activity can be beneficial. Watch cholesterol and blood lipids Have your blood tested for lipids and cholesterol at 73 years of age, then have this test every 5 years. Have your cholesterol levels checked more often if: Your lipid or cholesterol levels are high. You are older than 73 years of age. You are at high risk for heart disease. What should I know about cancer screening? Depending on your health history and family history, you may need to have cancer screening at various ages. This may include screening for: Breast cancer. Cervical cancer. Colorectal cancer. Skin cancer. Lung cancer. What should I know about heart disease, diabetes, and high blood pressure? Blood pressure and heart disease High blood pressure causes heart disease and increases the risk of stroke. This is more likely to develop in people who have high blood pressure readings or are overweight. Have your  blood pressure checked: Every 3-5 years if you are 64-36 years of age. Every year if you are 79 years old or older. Diabetes Have regular diabetes screenings. This checks your fasting blood sugar level. Have the screening done: Once every three years after age 36 if you are at a normal weight and have a low risk for diabetes. More often and at a younger age if you are overweight or have a high risk for diabetes. What should I know about preventing infection? Hepatitis B If you have a higher risk for hepatitis B, you should be screened for this virus. Talk with  your health care provider to find out if you are at risk for hepatitis B infection. Hepatitis C Testing is recommended for: Everyone born from [birth year] through 1963. Anyone with known risk factors for hepatitis C. Sexually transmitted infections (STIs) Get screened for STIs, including gonorrhea and chlamydia, if: You are sexually active and are younger than 73 years of age. You are older than 74 years of age and your health care provider tells you that you are at risk for this type of infection. Your sexual activity has changed since you were last screened, and you are at increased risk for chlamydia or gonorrhea. Ask your health care provider if you are at risk. Ask your health care provider about whether you are at high risk for HIV. Your health care provider may recommend a prescription medicine to help prevent HIV infection. If you choose to take medicine to prevent HIV, you should first get tested for HIV. You should then be tested every 3 months for as long as you are taking the medicine. Pregnancy If you are about to stop having your period (premenopausal) and you may become pregnant, seek counseling before you get pregnant. Take 400 to 800 micrograms (mcg) of folic acid every day if you become pregnant. Ask for birth control (contraception) if you want to prevent pregnancy. Osteoporosis and menopause Osteoporosis is a disease in which the bones lose minerals and strength with aging. This can result in bone fractures. If you are 63 years old or older, or if you are at risk for osteoporosis and fractures, ask your health care provider if you should: Be screened for bone loss. Take a calcium or vitamin D supplement to lower your risk of fractures. Be given hormone replacement therapy (HRT) to treat symptoms of menopause. Follow these instructions at home: Alcohol use Do not drink alcohol if: Your health care provider tells you not to drink. You are pregnant, may be pregnant, or are planning  to become pregnant. If you drink alcohol: Limit how much you have to: 0-1 drink a day. Know how much alcohol is in your drink. In the [place] one drink equals one 12 oz bottle of beer (355 mL), one 5 oz glass of wine (148 mL), or one 1 oz glass of hard liquor (44 mL). Lifestyle Do not use any products that contain nicotine or tobacco. These products include cigarettes, chewing tobacco, and vaping devices, such as e-cigarettes. If you need help quitting, ask your health care provider. Do not use street drugs. Do not share needles. Ask your health care provider for help if you need support or information about quitting drugs. General instructions Schedule regular health, dental, and eye exams. Stay current with your vaccines. Tell your health care provider if: You often feel depressed. You have ever been abused or do not feel safe at home. Summary Adopting a healthy lifestyle and getting preventive care  are important in promoting health and wellness. Follow your health care provider's instructions about healthy diet, exercising, and getting tested or screened for diseases. Follow your health care provider's instructions on monitoring your cholesterol and blood pressure. This information is not intended to replace advice given to you by your health care provider. Make sure you discuss any questions you have with your health care provider. Document Revised: 10/18/2020 Document Reviewed: 10/18/2020 Elsevier Patient Education  [place].

## 2021-05-26 ENCOUNTER — Other Ambulatory Visit: Payer: Self-pay | Admitting: Family Medicine

## 2021-07-11 ENCOUNTER — Other Ambulatory Visit: Payer: Self-pay | Admitting: Family Medicine

## 2021-07-28 DIAGNOSIS — I10 Essential (primary) hypertension: Secondary | ICD-10-CM | POA: Diagnosis not present

## 2021-07-28 DIAGNOSIS — E785 Hyperlipidemia, unspecified: Secondary | ICD-10-CM | POA: Diagnosis not present

## 2021-07-29 LAB — CMP14+EGFR
ALT: 30 IU/L (ref 0–32)
AST: 25 IU/L (ref 0–40)
Albumin/Globulin Ratio: 1.8 (ref 1.2–2.2)
Albumin: 4.2 g/dL (ref 3.7–4.7)
Alkaline Phosphatase: 83 IU/L (ref 44–121)
BUN/Creatinine Ratio: 20 (ref 12–28)
BUN: 17 mg/dL (ref 8–27)
Bilirubin Total: 0.3 mg/dL (ref 0.0–1.2)
CO2: 25 mmol/L (ref 20–29)
Calcium: 10 mg/dL (ref 8.7–10.3)
Chloride: 103 mmol/L (ref 96–106)
Creatinine, Ser: 0.87 mg/dL (ref 0.57–1.00)
Globulin, Total: 2.3 g/dL (ref 1.5–4.5)
Glucose: 89 mg/dL (ref 70–99)
Potassium: 4.3 mmol/L (ref 3.5–5.2)
Sodium: 141 mmol/L (ref 134–144)
Total Protein: 6.5 g/dL (ref 6.0–8.5)
eGFR: 70 mL/min/{1.73_m2} (ref >59)

## 2021-07-29 LAB — LIPID PANEL
Chol/HDL Ratio: 3.7 ratio (ref 0.0–4.4)
Cholesterol, Total: 202 mg/dL — ABNORMAL HIGH (ref 100–199)
HDL: 55 mg/dL (ref >39)
LDL Chol Calc (NIH): 132 mg/dL — ABNORMAL HIGH (ref 0–99)
Triglycerides: 83 mg/dL (ref 0–149)
VLDL Cholesterol Cal: 15 mg/dL (ref 5–40)

## 2021-08-02 ENCOUNTER — Ambulatory Visit (INDEPENDENT_AMBULATORY_CARE_PROVIDER_SITE_OTHER): Payer: Medicare Other | Admitting: Family Medicine

## 2021-08-02 ENCOUNTER — Encounter: Payer: Self-pay | Admitting: Family Medicine

## 2021-08-02 ENCOUNTER — Other Ambulatory Visit: Payer: Self-pay

## 2021-08-02 VITALS — BP 119/71 | HR 89 | Ht 66.0 in | Wt 210.0 lb

## 2021-08-02 DIAGNOSIS — F419 Anxiety disorder, unspecified: Secondary | ICD-10-CM

## 2021-08-02 DIAGNOSIS — E785 Hyperlipidemia, unspecified: Secondary | ICD-10-CM

## 2021-08-02 DIAGNOSIS — Z86010 Personal history of colonic polyps: Secondary | ICD-10-CM | POA: Diagnosis not present

## 2021-08-02 DIAGNOSIS — E669 Obesity, unspecified: Secondary | ICD-10-CM

## 2021-08-02 DIAGNOSIS — M1711 Unilateral primary osteoarthritis, right knee: Secondary | ICD-10-CM | POA: Diagnosis not present

## 2021-08-02 DIAGNOSIS — E559 Vitamin D deficiency, unspecified: Secondary | ICD-10-CM

## 2021-08-02 DIAGNOSIS — I10 Essential (primary) hypertension: Secondary | ICD-10-CM | POA: Diagnosis not present

## 2021-08-02 DIAGNOSIS — Z1231 Encounter for screening mammogram for malignant neoplasm of breast: Secondary | ICD-10-CM | POA: Diagnosis not present

## 2021-08-02 NOTE — Patient Instructions (Addendum)
Annual exam in September, call if you need me sooner  Please schedule mammogram at checkout.  You are referred for colonoscopy which is due in December.  Fasting CBC lipid CMP and EGFR TSH and vitamin D 5 days before next appointment.  Please commit to regular exercise five   Days per week as able.  Please commit to increasing vegetable intake especially green leafy vegetables.  Please consider talking to a therapist.  I believe this will help you significantly.    Thanks for choosing [facility], we consider it a privelige to serve you.

## 2021-08-02 NOTE — Progress Notes (Signed)
Karen [name]     MRN: [id number]      DOB: 04-05-1946   HPI Karen [name] is here for follow up and re-evaluation of chronic medical conditions, medication management and review of any available recent lab and radiology data.  Preventive health is updated, specifically  Cancer screening and Immunization.   Questions or concerns regarding consultations or procedures which the PT has had in the interim are  addressed. The PT denies any adverse reactions to current medications since the last visit.  Chronic anxiety , which she states will persist as long as she remains with her husband who repeatedly tells her to leave. She is uncertain if she can make it on her own and this keeps her there, although she reports being responsible for a lot of the bills. Will benefit from therapy annd will consider it  ROS Denies recent fever or chills. Denies sinus pressure, nasal congestion, ear pain or sore throat. Denies chest congestion, productive cough or wheezing. Denies chest pains, palpitations and leg swelling Denies abdominal pain, nausea, vomiting,diarrhea or constipation.   Denies dysuria, frequency, hesitancy or incontinence. Generalized joint pain esp knees and foot/ ankle, has upcoming dental work then will tackle joints Denies headaches, seizures, numbness, or tingling. Denies skin break down or rash.   PE  BP 119/71 (BP Location: Right Arm, Patient Position: Sitting, Cuff Size: Large)    Pulse 89    Ht 5\' 6"  (1.676 m)    Wt 210 lb (95.3 kg)    SpO2 98%    BMI 33.89 kg/m   Patient alert and oriented and in no cardiopulmonary distress.  HEENT: No facial asymmetry, EOMI,     Neck supple .  Chest: Clear to auscultation bilaterally.  CVS: S1, S2 no murmurs, no S3.Regular rate.  ABD: Soft non tender.   Ext: No edema  MS: Adequate ROM spine, shoulders, hips and  reduced in knees.  Skin: Intact, no ulcerations or rash noted.  Psych: Good eye contact, normal affect. Memory intact  mildly  anxious not  depressed appearing.  CNS: CN 2-12 intact, power,  normal throughout.no focal deficits noted.   Assessment & Plan  Essential hypertension, benign Controlled, no change in medication DASH diet and commitment to daily physical activity for a minimum of 30 minutes discussed and encouraged, as a part of hypertension management. The importance of attaining a healthy weight is also discussed.  BP/Weight 08/02/2021 03/04/2021 03/01/2021 09/27/2020 04/27/2020 04/07/2020 10/04/9561  Systolic BP 875 643 329 518 841 660 630  Diastolic BP 71 62 70 88 77 77 72  Wt. (Lbs) 210 213 214.12 216 211 211.12 211  BMI 33.89 33.36 33.54 33.83 33.55 33.57 34.06       Hx of adenomatous colonic polyps rept colonscopy due in December , referral entered  Obesity (BMI 30.0-34.9)  Patient re-educated about  the importance of commitment to a  minimum of 150 minutes of exercise per week as able.  The importance of healthy food choices with portion control discussed, as well as eating regularly and within a 12 hour window most days. The need to choose "clean , green" food 50 to 75% of the time is discussed, as well as to make water the primary drink and set a goal of 64 ounces water daily.    Weight /BMI 08/02/2021 03/04/2021 03/01/2021  WEIGHT 210 lb 213 lb 214 lb 1.9 oz  HEIGHT 5\' 6"  5\' 7"  5\' 7"   BMI 33.89 kg/m2 33.36 kg/m2 33.54 kg/m2  Osteoarthritis of knee Plans on surgery later in the year  Hyperlipidemia LDL goal <100 Hyperlipidemia:Low fat diet discussed and encouraged.   Lipid Panel  Lab Results  Component Value Date   CHOL 202 (H) 07/28/2021   HDL 55 07/28/2021   LDLCALC 132 (H) 07/28/2021   TRIG 83 07/28/2021   CHOLHDL 3.7 07/28/2021   Needs to reduce fat intake, no med chnage    Anxiety  Anxiety related to stress at home from spouse who she has a poor relationship, motivated to live independently but fears that she is unable to actually do this, therapy will  be beneficial, she will consider this

## 2021-08-08 ENCOUNTER — Encounter: Payer: Self-pay | Admitting: Family Medicine

## 2021-08-08 DIAGNOSIS — F419 Anxiety disorder, unspecified: Secondary | ICD-10-CM | POA: Insufficient documentation

## 2021-08-08 NOTE — Assessment & Plan Note (Signed)
°  Patient re-educated about  the importance of commitment to a  minimum of 150 minutes of exercise per week as able.  The importance of healthy food choices with portion control discussed, as well as eating regularly and within a 12 hour window most days. The need to choose "clean , green" food 50 to 75% of the time is discussed, as well as to make water the primary drink and set a goal of 64 ounces water daily.    Weight /BMI 08/02/2021 03/04/2021 03/01/2021  WEIGHT 210 lb 213 lb 214 lb 1.9 oz  HEIGHT 5\' 6"  5\' 7"  5\' 7"   BMI 33.89 kg/m2 33.36 kg/m2 33.54 kg/m2

## 2021-08-08 NOTE — Assessment & Plan Note (Signed)
rept colonscopy due in December , referral entered

## 2021-08-08 NOTE — Assessment & Plan Note (Signed)
Controlled, no change in medication DASH diet and commitment to daily physical activity for a minimum of 30 minutes discussed and encouraged, as a part of hypertension management. The importance of attaining a healthy weight is also discussed.  BP/Weight 08/02/2021 03/04/2021 03/01/2021 09/27/2020 04/27/2020 04/07/2020 9/92/4268  Systolic BP 341 962 229 798 921 194 174  Diastolic BP 71 62 70 88 77 77 72  Wt. (Lbs) 210 213 214.12 216 211 211.12 211  BMI 33.89 33.36 33.54 33.83 33.55 33.57 34.06

## 2021-08-08 NOTE — Assessment & Plan Note (Signed)
Anxiety related to stress at home from spouse who she has a poor relationship, motivated to live independently but fears that she is unable to actually do this, therapy will be beneficial, she will consider this

## 2021-08-08 NOTE — Assessment & Plan Note (Signed)
Plans on surgery later in the year

## 2021-08-08 NOTE — Assessment & Plan Note (Signed)
Hyperlipidemia:Low fat diet discussed and encouraged.   Lipid Panel  Lab Results  Component Value Date   CHOL 202 (H) 07/28/2021   HDL 55 07/28/2021   LDLCALC 132 (H) 07/28/2021   TRIG 83 07/28/2021   CHOLHDL 3.7 07/28/2021   Needs to reduce fat intake, no med chnage

## 2021-08-25 ENCOUNTER — Other Ambulatory Visit: Payer: Self-pay | Admitting: Family Medicine

## 2021-09-05 ENCOUNTER — Other Ambulatory Visit: Payer: Self-pay

## 2021-09-05 ENCOUNTER — Encounter (HOSPITAL_COMMUNITY): Payer: Self-pay | Admitting: Emergency Medicine

## 2021-09-05 ENCOUNTER — Emergency Department (HOSPITAL_COMMUNITY)
Admission: EM | Admit: 2021-09-05 | Discharge: 2021-09-06 | Disposition: A | Discharge status: Home / Self Care | Payer: Medicare Other | Attending: Emergency Medicine | Admitting: Emergency Medicine

## 2021-09-05 DIAGNOSIS — R42 Dizziness and giddiness: Secondary | ICD-10-CM | POA: Diagnosis not present

## 2021-09-05 DIAGNOSIS — R112 Nausea with vomiting, unspecified: Secondary | ICD-10-CM | POA: Insufficient documentation

## 2021-09-05 DIAGNOSIS — Z79899 Other long term (current) drug therapy: Secondary | ICD-10-CM | POA: Diagnosis not present

## 2021-09-05 LAB — CBC WITH DIFFERENTIAL/PLATELET
Abs Immature Granulocytes: 0.03 10*3/uL (ref 0.00–0.07)
Basophils Absolute: 0 10*3/uL (ref 0.0–0.1)
Basophils Relative: 0 %
Eosinophils Absolute: 0 10*3/uL (ref 0.0–0.5)
Eosinophils Relative: 0 %
HCT: 37.2 % (ref 36.0–46.0)
Hemoglobin: 12.2 g/dL (ref 12.0–15.0)
Immature Granulocytes: 0 %
Lymphocytes Relative: 9 %
Lymphs Abs: 0.7 10*3/uL (ref 0.7–4.0)
MCH: 30.6 pg (ref 26.0–34.0)
MCHC: 32.8 g/dL (ref 30.0–36.0)
MCV: 93.2 fL (ref 80.0–100.0)
Monocytes Absolute: 0.5 10*3/uL (ref 0.1–1.0)
Monocytes Relative: 6 %
Neutro Abs: 6.8 10*3/uL (ref 1.7–7.7)
Neutrophils Relative %: 85 %
Platelets: 285 10*3/uL (ref 150–400)
RBC: 3.99 MIL/uL (ref 3.87–5.11)
RDW: 12.3 % (ref 11.5–15.5)
WBC: 8 10*3/uL (ref 4.0–10.5)
nRBC: 0 % (ref 0.0–0.2)

## 2021-09-05 LAB — COMPREHENSIVE METABOLIC PANEL
ALT: 19 U/L (ref 0–44)
AST: 20 U/L (ref 15–41)
Albumin: 3.9 g/dL (ref 3.5–5.0)
Alkaline Phosphatase: 72 U/L (ref 38–126)
Anion gap: 9 (ref 5–15)
BUN: 18 mg/dL (ref 8–23)
CO2: 24 mmol/L (ref 22–32)
Calcium: 10 mg/dL (ref 8.9–10.3)
Chloride: 106 mmol/L (ref 98–111)
Creatinine, Ser: 0.75 mg/dL (ref 0.44–1.00)
GFR, Estimated: >60 mL/min (ref >60)
Glucose, Bld: 146 mg/dL — ABNORMAL HIGH (ref 70–99)
Potassium: 4.1 mmol/L (ref 3.5–5.1)
Sodium: 139 mmol/L (ref 135–145)
Total Bilirubin: 0.5 mg/dL (ref 0.3–1.2)
Total Protein: 7.2 g/dL (ref 6.5–8.1)

## 2021-09-05 NOTE — ED Triage Notes (Addendum)
Pt states she started to get dizzy last night and she took a meclizine and then woke up around 4am dizzy again and started vomiting and states she has been that way all day. States she is fine when her eyes are closed but when she stands; she feels like she is going to pass out.  ?

## 2021-09-06 ENCOUNTER — Emergency Department (HOSPITAL_COMMUNITY): Payer: Medicare Other

## 2021-09-06 DIAGNOSIS — R42 Dizziness and giddiness: Secondary | ICD-10-CM | POA: Diagnosis not present

## 2021-09-06 LAB — URINALYSIS, ROUTINE W REFLEX MICROSCOPIC
Bilirubin Urine: NEGATIVE
Glucose, UA: NEGATIVE mg/dL
Hgb urine dipstick: NEGATIVE
Ketones, ur: NEGATIVE mg/dL
Nitrite: NEGATIVE
Protein, ur: NEGATIVE mg/dL
Specific Gravity, Urine: 1.02 (ref 1.005–1.030)
pH: 6 (ref 5.0–8.0)

## 2021-09-06 LAB — RAPID URINE DRUG SCREEN, HOSP PERFORMED
Amphetamines: NOT DETECTED
Barbiturates: NOT DETECTED
Benzodiazepines: NOT DETECTED
Cocaine: NOT DETECTED
Opiates: NOT DETECTED
Tetrahydrocannabinol: NOT DETECTED

## 2021-09-06 IMAGING — MR MR HEAD W/O CM
11 of 12 series · 40 of 48 positions shown · non-contrast
Comparison: Head CT [3R] hours today.

CLINICAL DATA: 75-year-old female with persistent dizziness.

EXAM:
MRI HEAD WITHOUT CONTRAST
TECHNIQUE: Multiplanar, multiecho pulse sequences of the brain and surrounding
structures were obtained without intravenous contrast.

[Series 5: DWI · axial · 4.0mm · 0.88mm/px · z∈[-91,+48]mm · 4 of 36 slices shown (1 of 6)]
[im 1/36]
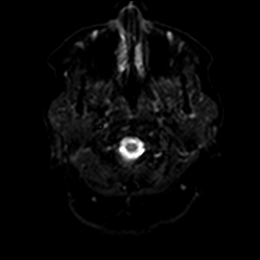
[im 12/36]
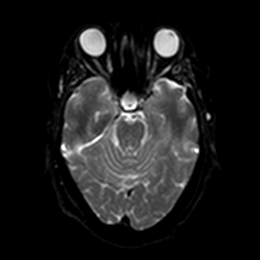
[im 24/36]
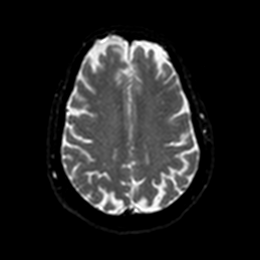
[im 36/36]
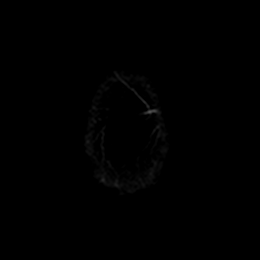

[Series 5: DWI · axial · 4.0mm · 0.88mm/px · z∈[-91,+48]mm · 4 of 36 slices shown (2 of 6)]
[im 1/36]
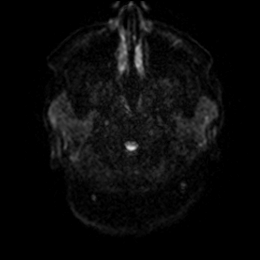
[im 12/36]
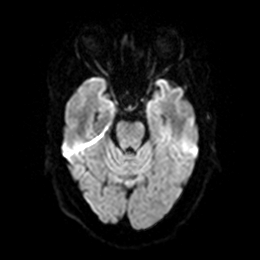
[im 24/36]
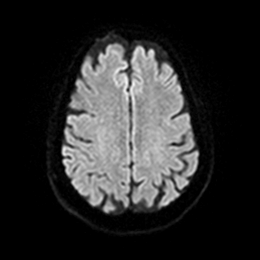
[im 36/36]
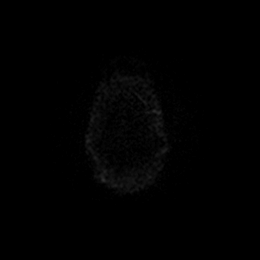

[Series 6: DWI · axial · 4.0mm · 0.88mm/px · z∈[-91,+48]mm · 4 of 36 slices shown (3 of 6)]
[im 1/36]
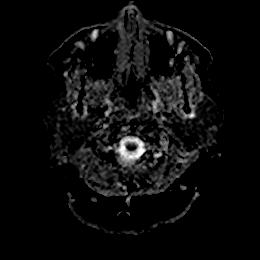
[im 12/36]
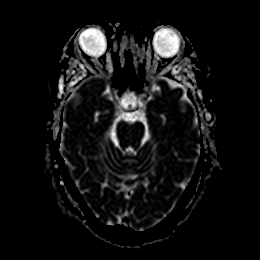
[im 24/36]
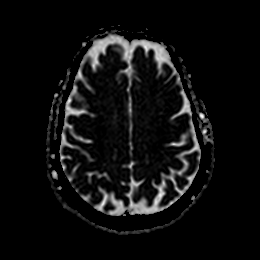
[im 36/36]
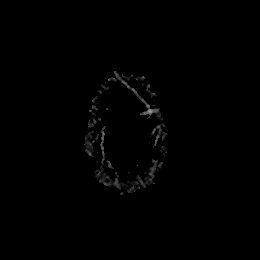

[Series 7: DWI · coronal · 5.0mm · 0.88mm/px · 3 of 28 slices shown (4 of 6)]
[im 1/28]
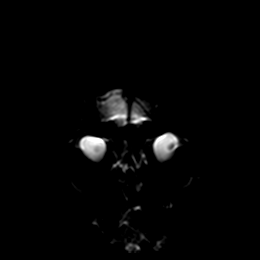
[im 14/28]
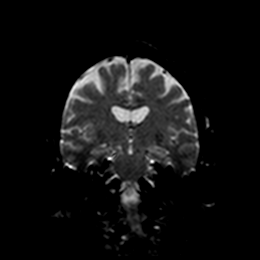
[im 28/28]
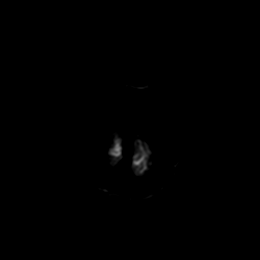

[Series 7: DWI · coronal · 5.0mm · 0.88mm/px · 4 of 28 slices shown (5 of 6)]
[im 1/28]
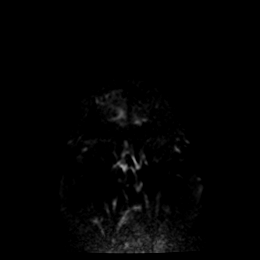
[im 10/28]
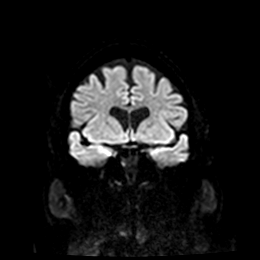
[im 19/28]
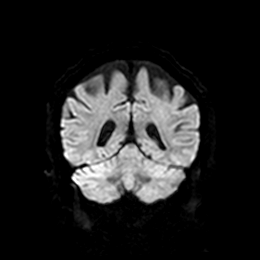
[im 28/28]
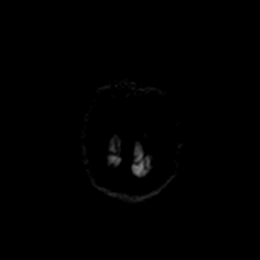

[Series 8: DWI · coronal · 5.0mm · 0.88mm/px · 4 of 28 slices shown (6 of 6)]
[im 1/28]
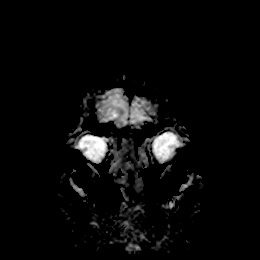
[im 10/28]
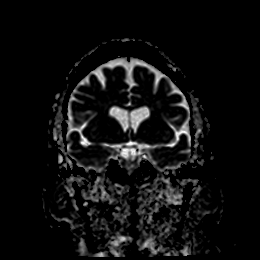
[im 19/28]
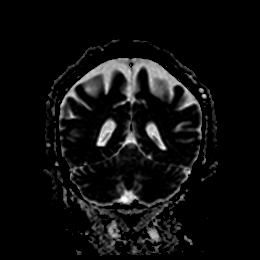
[im 28/28]
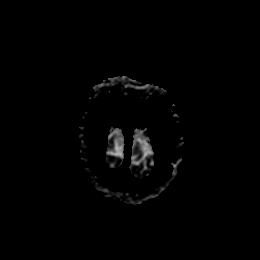

[Series 9: T1 · sagittal · 5.0mm · 0.94mm/px · 3 of 21 slices shown]
[im 1/21]
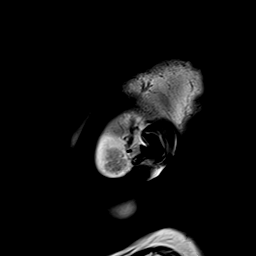
[im 11/21]
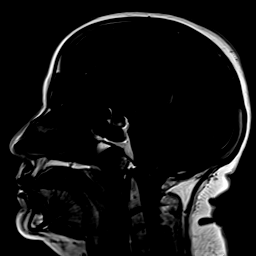
[im 21/21]
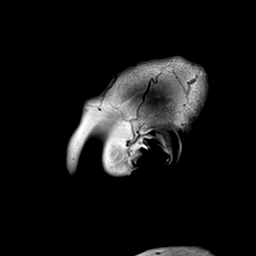

[Series 10: T2 · axial · 5.0mm · 0.72mm/px · z∈[-88,+45]mm · 3 of 20 slices shown (1 of 2)]
[im 1/20]
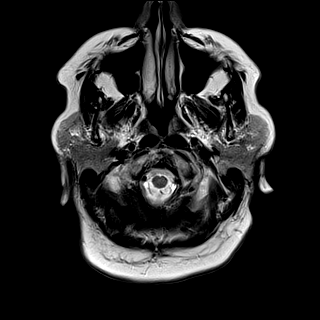
[im 10/20]
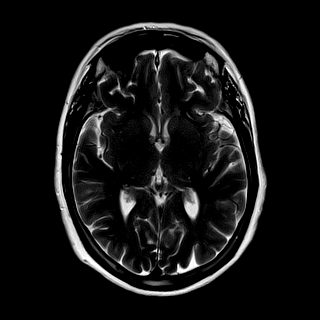
[im 20/20]
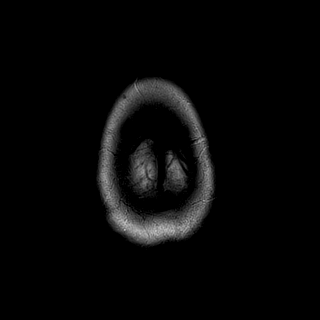

[Series 11: ax hemo · axial · 5.0mm · 0.86mm/px · z∈[-94,+49]mm · 3 of 25 slices shown]
[im 1/25]
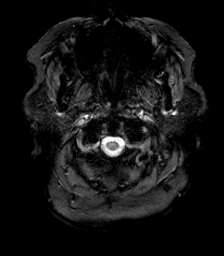
[im 13/25]
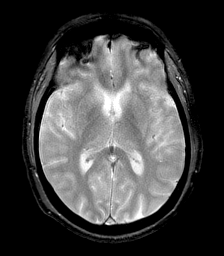
[im 25/25]
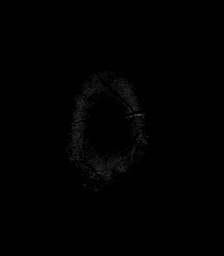

[Series 12: FLAIR · axial · 4.0mm · 0.43mm/px · z∈[-84,+40]mm · 4 of 32 slices shown]
[im 1/32]
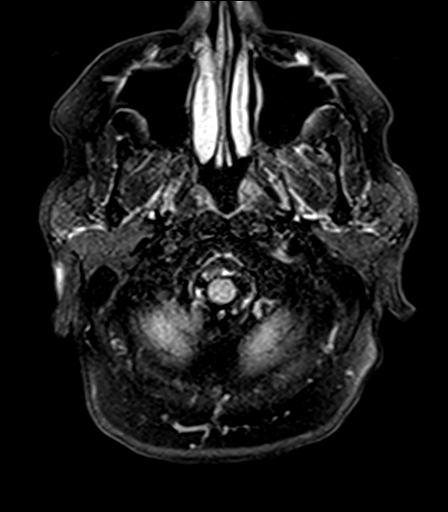
[im 11/32]
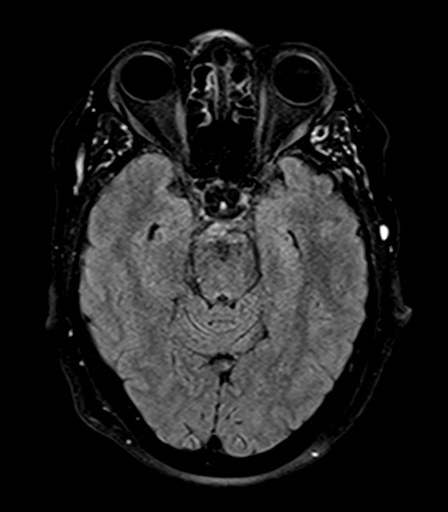
[im 21/32]
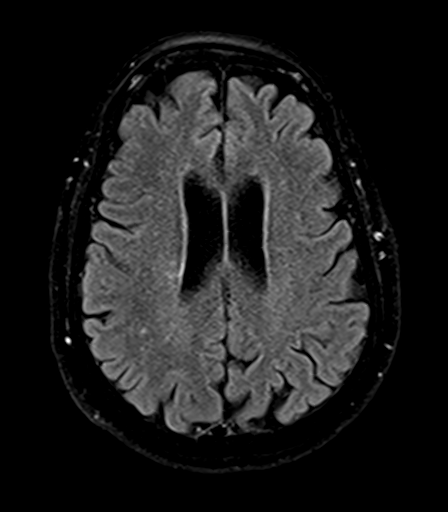
[im 32/32]
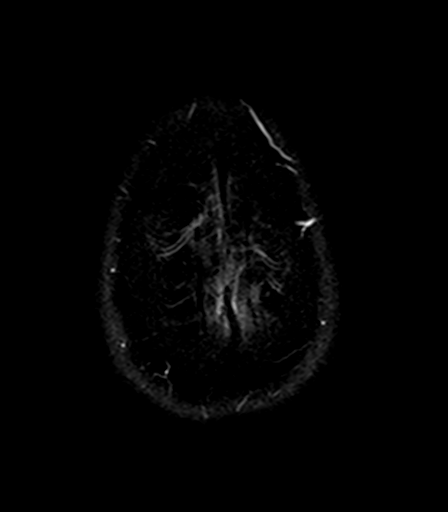

[Series 14: T2 · coronal · 5.0mm · 0.72mm/px · 4 of 28 slices shown (2 of 2)]
[im 1/28]
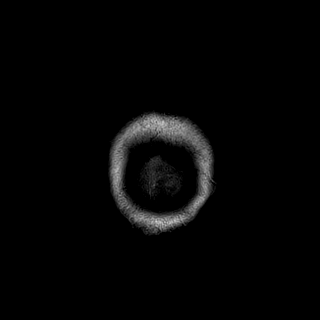
[im 10/28]
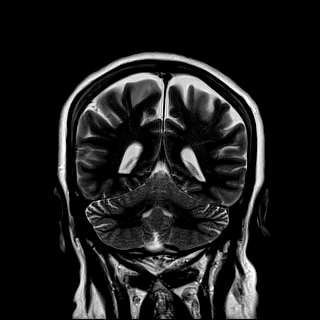
[im 19/28]
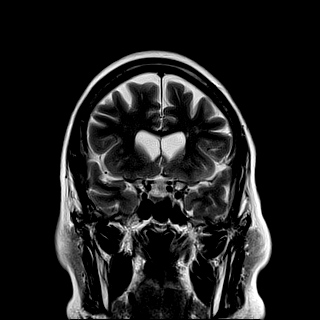
[im 28/28]
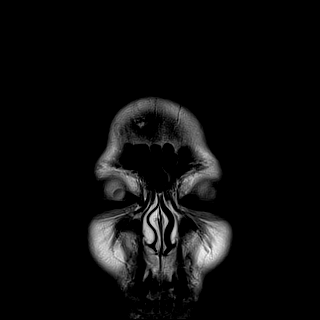

[40 of 48 positions shown; findings below may reference images not displayed]

FINDINGS: Brain: No restricted diffusion to suggest acute infarction. No
midline shift, mass effect, evidence of mass lesion,
ventriculomegaly, extra-axial collection or acute intracranial
hemorrhage. Cervicomedullary junction and pituitary are within
normal limits.

Largely normal for age gray and white matter signal throughout the
brain, minimal nonspecific white matter T2 and FLAIR hyperintensity
with no cortical encephalomalacia or chronic cerebral blood products
identified. Deep gray matter nuclei, brainstem and cerebellum appear
normal for age.

Vascular: Major intracranial vascular flow voids are preserved.

Skull and upper cervical spine: Negative visible cervical spine.
Normal bone marrow signal.

Sinuses/Orbits: Leftward gaze, but otherwise negative orbits.
Paranasal sinuses and mastoids are stable and well aerated.

Other: Grossly normal visible internal auditory structures. Negative
visible scalp and face.
IMPRESSION: No acute intracranial abnormality and essentially normal for age
noncontrast MRI appearance of the brain.

## 2021-09-06 IMAGING — CT CT HEAD W/O CM
4 series · 16 of 47 positions shown, 18 images · non-contrast
Comparison: None.

CLINICAL DATA: Dizziness for 2 days



[Series 2: head w o · axial · 0.42mm/px · z∈[-557,-447]mm · 7 of 30 slices shown, 9 images]
[im 4/30  brain]
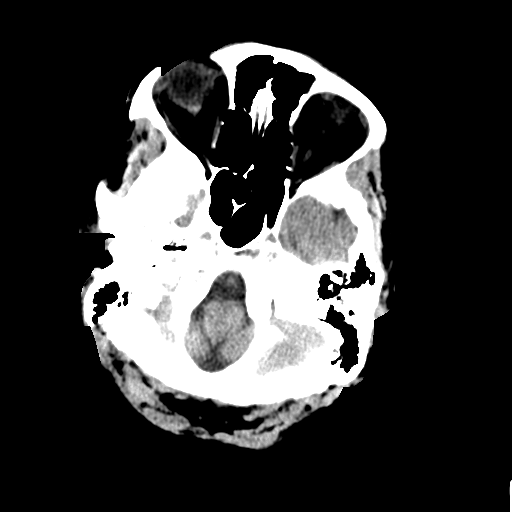
[im 4/30  bone]
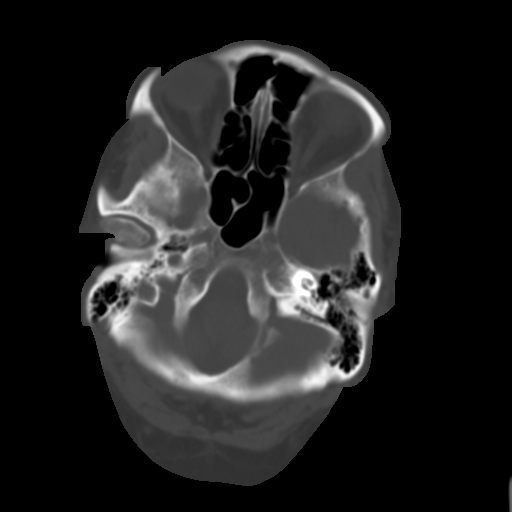
[im 8/30  brain]
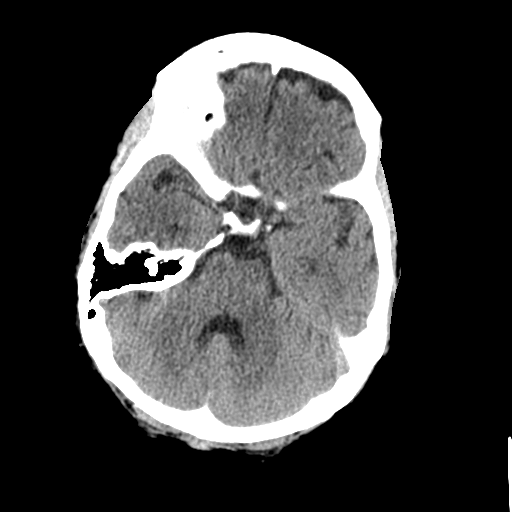
[im 11/30  brain]
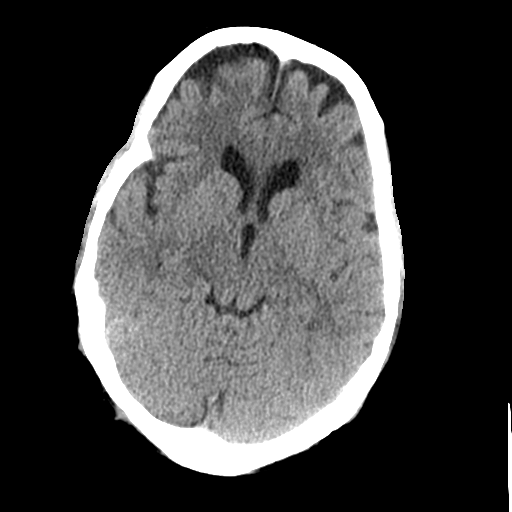
[im 15/30  brain]
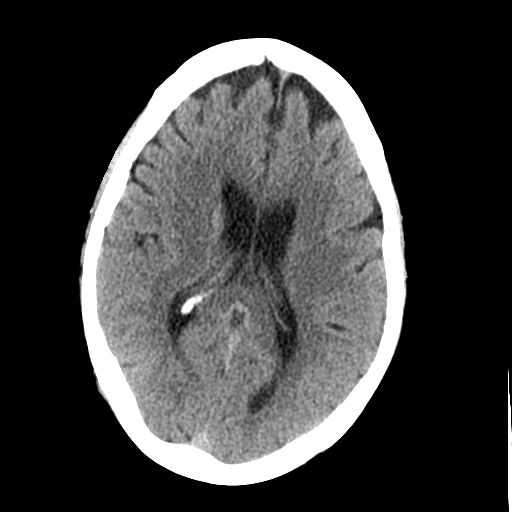
[im 19/30  brain]
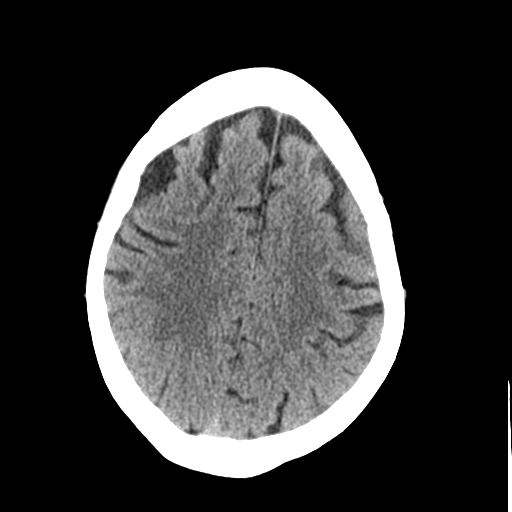
[im 19/30  bone]
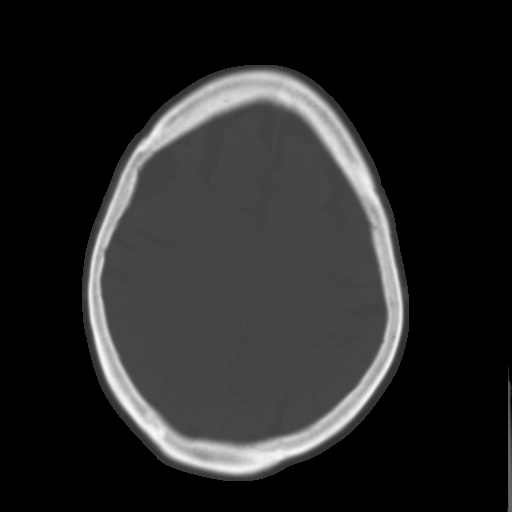
[im 22/30  brain]
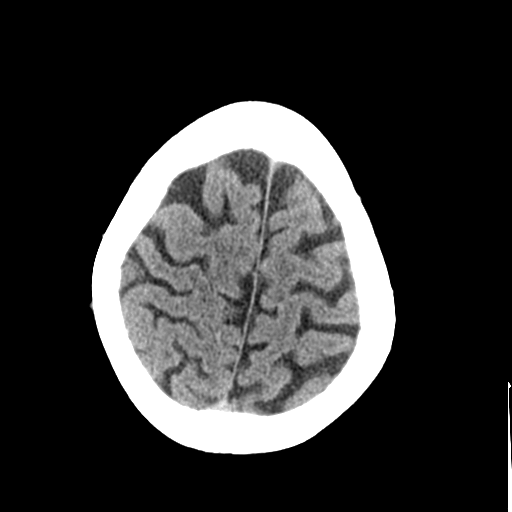
[im 26/30  brain]
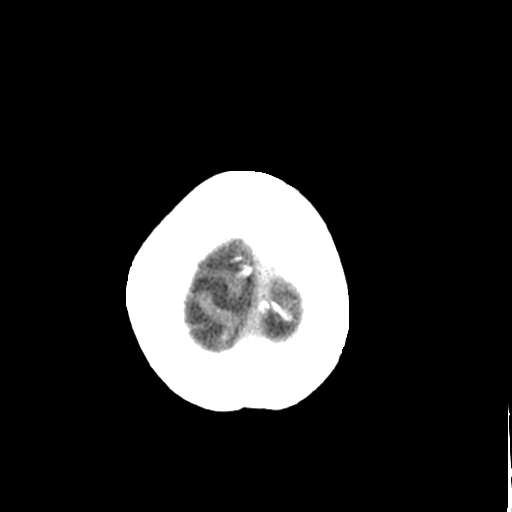

[Series 3: head bone · axial · 0.42mm/px · z∈[-558,-530]mm · 3 of 74 slices shown]
[im 8/74  bone]
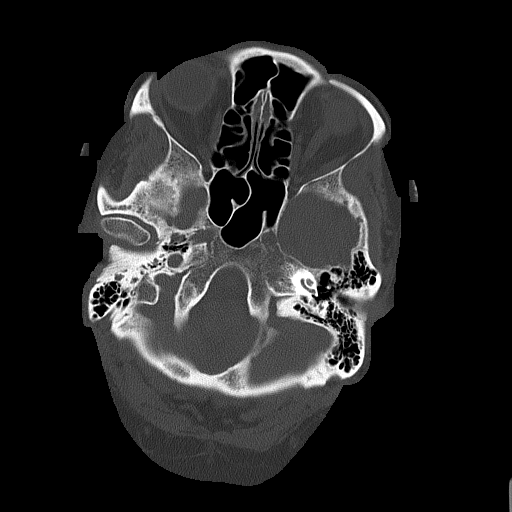
[im 15/74  bone]
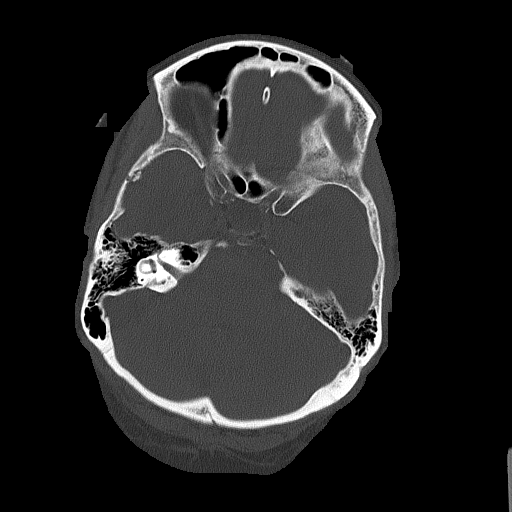
[im 22/74  bone]
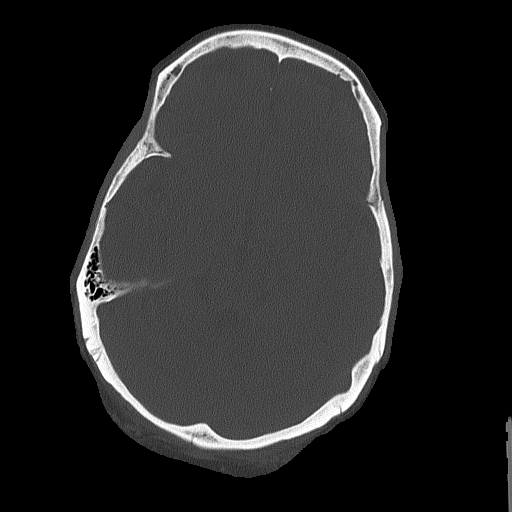

[Series 4: coronal soft · coronal · 0.31mm/px · 3 of 67 slices shown]
[im 23/67  brain]
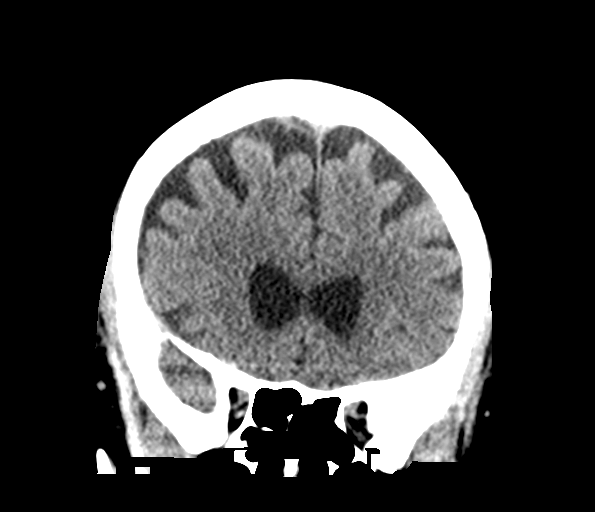
[im 30/67  brain]
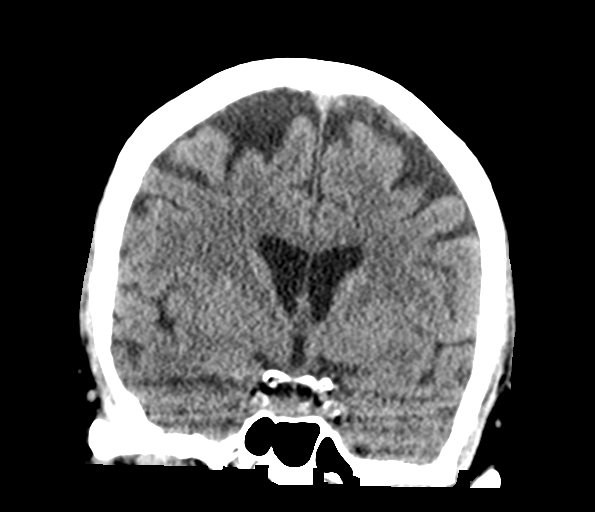
[im 37/67  brain]
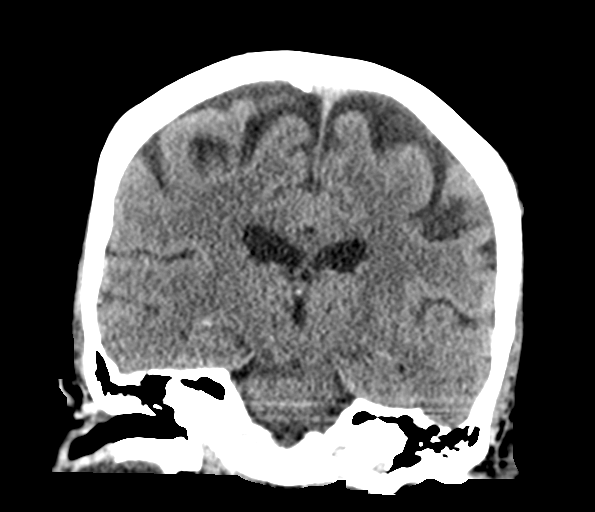

[Series 5: sagittal soft · sagittal · 0.29mm/px · 3 of 53 slices shown]
[im 18/53  brain]
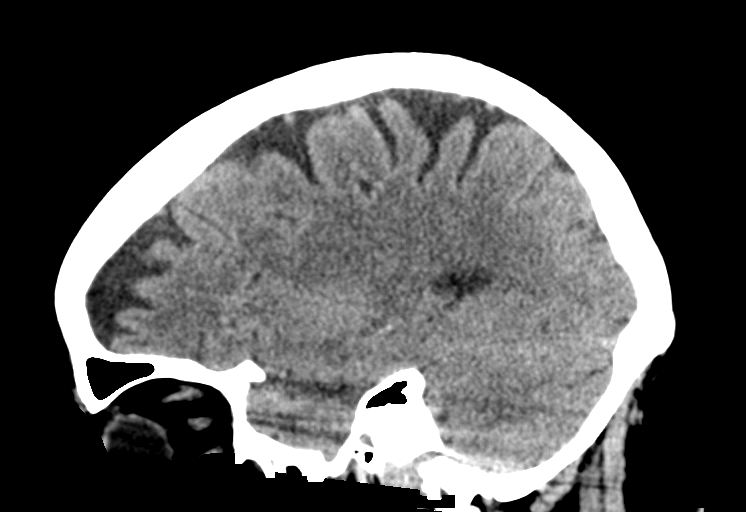
[im 27/53  brain]
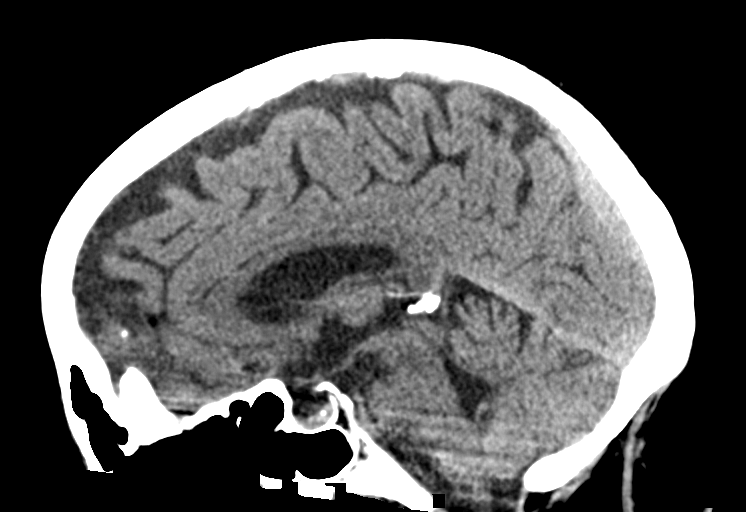
[im 35/53  brain]
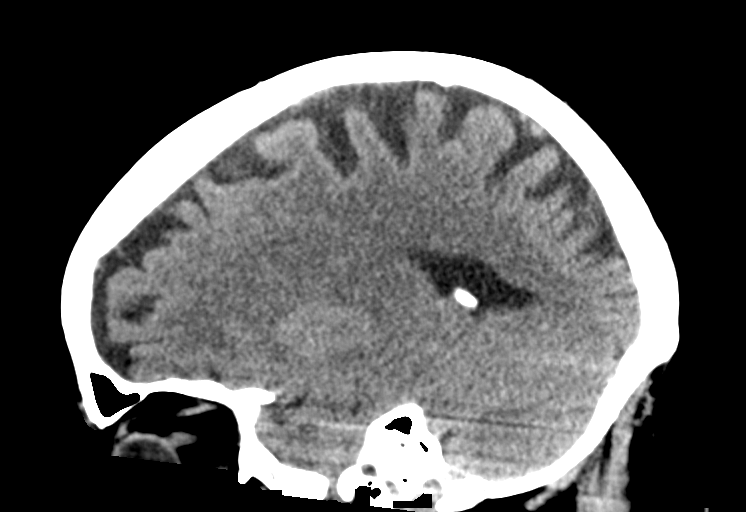

[16 of 47 positions shown; findings below may reference images not displayed]

FINDINGS: Brain: No evidence of acute infarction, hemorrhage, hydrocephalus,
extra-axial collection or mass lesion/mass effect. Mild atrophic
changes are noted.

Vascular: No hyperdense vessel or unexpected calcification.

Skull: Normal. Negative for fracture or focal lesion.

Sinuses/Orbits: No acute finding.

Other: None.
IMPRESSION: Mild atrophic changes without acute abnormality.

## 2021-09-06 MED ORDER — MECLIZINE HCL 25 MG PO TABS: 25.0000 mg | ORAL_TABLET | Freq: Three times a day (TID) | ORAL | 0 refills | Status: DC | PRN

## 2021-09-06 MED ORDER — MECLIZINE HCL 12.5 MG PO TABS
25.0000 mg | ORAL_TABLET | Freq: Once | ORAL | Status: AC
  Administered 2021-09-06: 25 mg via ORAL
  Filled 2021-09-06: qty 2

## 2021-09-06 MED ORDER — ONDANSETRON HCL 4 MG/2ML IJ SOLN
4.0000 mg | Freq: Once | INTRAMUSCULAR | Status: AC
  Administered 2021-09-06: 4 mg via INTRAVENOUS
  Filled 2021-09-06: qty 2

## 2021-09-06 MED ORDER — LACTATED RINGERS IV BOLUS
1000.0000 mL | Freq: Once | INTRAVENOUS | Status: AC
  Administered 2021-09-06: 1000 mL via INTRAVENOUS

## 2021-09-06 NOTE — ED Notes (Signed)
Pt going to MRI

## 2021-09-06 NOTE — ED Notes (Signed)
Pt taken to bathroom,  ?Hat placed to catch urine ?UA not obtained, all urine missed hat.  ?Will attempt after fluids ?

## 2021-09-06 NOTE — ED Notes (Signed)
Patient transported to CT 

## 2021-09-06 NOTE — ED Notes (Signed)
Nurse offered toilet to pt for urine sample and pt notified nurse she just went to bathroom. Nurse informed pt the next time she urinates to please get a sample.  ?

## 2021-09-06 NOTE — ED Provider Notes (Signed)
?[facility] ?Provider Note ? ? ?CSN: [id number] ?Arrival date & time: 09/05/21  2122 ? ?  ? ?History ? ?Chief Complaint  ?Patient presents with  ? Dizziness  ? Emesis  ? ? ?Karen [name] is a 76 y.o. female. ? ?The history is provided by the patient.  ?Dizziness ?Quality:  Room spinning ?Severity:  Severe ?Onset quality:  Sudden ?Duration:  1 day ?Timing:  Intermittent ?Progression:  Worsening ?Chronicity:  New ?Relieved by:  Being still and closing eyes ?Worsened by:  Movement and eye movement ?Associated symptoms: nausea and vomiting   ?Associated symptoms: no chest pain, no headaches, no hearing loss, no syncope, no tinnitus, no vision changes and no weakness   ?Risk factors: hx of vertigo   ?Risk factors: no hx of stroke   ?Emesis ?Associated symptoms: no fever and no headaches   ?Patient presents with dizziness ?She reports over 24 hours ago she has been having episodes of dizziness and the world spinning.  She reports associated nausea vomiting.  She reports she is unable to walk.  No focal weakness.  Family denies any change in speech pattern ?She has had dizziness before but is usually able to take meclizine but was unable to keep it down this time ?Denies previous history of stroke. ?No new meds reported ?  ? ?Home Medications ?Prior to Admission medications   ?Medication Sig Start Date End Date Taking? Authorizing Provider  ?Cholecalciferol 25 MCG (1000 UT) tablet Take 2,000 Units by mouth daily.     [provider]  ?Coenzyme Q10 (CO Q 10 PO) Take 1 tablet by mouth daily.     [provider]  ?levocetirizine (XYZAL) 5 MG tablet Take 5 mg by mouth every evening.    [provider]  ?lisinopril-hydrochlorothiazide (ZESTORETIC) 20-25 MG tablet Take 1 tablet by mouth once daily 08/25/21   Fayrene Helper, MD  ?LYSINE PO Take by mouth.    [provider]  ?simvastatin (ZOCOR) 10 MG tablet TAKE 1 TABLET BY MOUTH ONCE DAILY *STOP ZETIA* 07/11/21    Fayrene Helper, MD  ?UNABLE TO FIND Bilateral compression hose x 1 pair 15-20 mm/hg ?Patient not taking: Reported on 08/02/2021 08/13/17   Fayrene Helper, MD  ?   ? ?Allergies    ?Amlodipine, Triamterene, Other, and Penicillins   ? ?Review of Systems   ?Review of Systems  ?Constitutional:  Negative for fever.  ?HENT:  Negative for hearing loss and tinnitus.   ?Eyes:  Negative for visual disturbance.  ?Cardiovascular:  Negative for chest pain and syncope.  ?Gastrointestinal:  Positive for nausea and vomiting.  ?Neurological:  Positive for dizziness. Negative for syncope, speech difficulty, weakness and headaches.  ?All other systems reviewed and are negative. ? ?Physical Exam ?Updated Vital Signs ?BP (!) 146/65   Pulse 67   Temp 98.1 ?F (36.7 ?C) (Oral)   Resp 11   Ht 1.676 m ('5\' 6"'$ )   Wt 95.3 kg   SpO2 99%   BMI 33.89 kg/m?  ?Physical Exam ?CONSTITUTIONAL: Well developed/well nourished, uncomfortable appearing ?HEAD: Normocephalic/atraumatic ?EYES: EOMI/PERRL, horizontal nystagmus noted, no visual ?field deficit  no ptosis ?ENMT: Mucous membranes moist ?NECK: supple no meningeal signs, no bruits ?CV: S1/S2 noted, no murmurs/rubs/gallops noted ?LUNGS: Lungs are clear to auscultation bilaterally, no apparent distress ?ABDOMEN: soft, nontender, no rebound or guarding ?GU:no cva tenderness ?NEURO:Awake/alert, face symmetric, no arm or leg drift is noted ?Equal 5/5 strength with shoulder abduction, elbow flex/extension, wrist flex/extension in upper  extremities and equal hand grips bilaterally ?Equal 5/5 strength with hip flexion,knee flex/extension, foot dorsi/plantar flexion ?Cranial nerves 3/4/5/6/12/18/08/11/12 tested and intact ?No past pointing ?Sensation to light touch intact in all extremities ?EXTREMITIES: pulses normal, full ROM ?SKIN: warm, color normal ?PSYCH: no abnormalities of mood noted ? ?ED Results / Procedures / Treatments   ?Labs ?(all labs ordered are listed, but only abnormal results are  displayed) ?Labs Reviewed  ?COMPREHENSIVE METABOLIC PANEL - Abnormal; Notable for the following components:  ?    Result Value  ? Glucose, Bld 146 (*)   ? All other components within normal limits  ?CBC WITH DIFFERENTIAL/PLATELET  ?RAPID URINE DRUG SCREEN, HOSP PERFORMED  ?URINALYSIS, ROUTINE W REFLEX MICROSCOPIC  ? ? ?EKG ?EKG Interpretation ? ?Date/Time:  Monday September 05 2021 22:06:31 EDT ?Ventricular Rate:  81 ?PR Interval:  176 ?QRS Duration: 82 ?QT Interval:  364 ?QTC Calculation: 422 ?R Axis:   -5 ?Text Interpretation: Normal sinus rhythm Cannot rule out Anterior infarct , age undetermined Abnormal ECG When compared with ECG of 13-Aug-2017 10:02, No significant change was found Confirmed by Aletta Edouard [phone]) on 09/05/2021 10:13:26 PM ? ?Radiology ?CT HEAD WO CONTRAST ? ?Result Date: 09/06/2021 ?CLINICAL DATA:  Dizziness for 2 days EXAM: CT HEAD WITHOUT CONTRAST TECHNIQUE: Contiguous axial images were obtained from the base of the skull through the vertex without intravenous contrast. RADIATION DOSE REDUCTION: This exam was performed according to the departmental dose-optimization program which includes automated exposure control, adjustment of the mA and/or kV according to patient size and/or use of iterative reconstruction technique. COMPARISON:  None. FINDINGS: Brain: No evidence of acute infarction, hemorrhage, hydrocephalus, extra-axial collection or mass lesion/mass effect. Mild atrophic changes are noted. Vascular: No hyperdense vessel or unexpected calcification. Skull: Normal. Negative for fracture or focal lesion. Sinuses/Orbits: No acute finding. Other: None. IMPRESSION: Mild atrophic changes without acute abnormality. Electronically Signed   By: Inez Catalina M.D.   On: 09/06/2021 01:21   ? ?Procedures ?Procedures  ? ? ?Medications Ordered in ED ?Medications  ?ondansetron (ZOFRAN) injection 4 mg (4 mg Intravenous Given 09/06/21 0104)  ?meclizine (ANTIVERT) tablet 25 mg (25 mg Oral Given 09/06/21  0220)  ?lactated ringers bolus 1,000 mL (1,000 mLs Intravenous New Bag/Given 09/06/21 2353)  ?meclizine (ANTIVERT) tablet 25 mg (25 mg Oral Given 09/06/21 0641)  ? ? ?ED Course/ Medical Decision Making/ A&P ?Clinical Course as of 09/06/21 0701  ?Tue Sep 06, 2021  ?0038 Glucose(!): 146 ?Hyperglycemia noted [DW]  ?0142 Patient presents with vertigo for over 24 hours.  It is triggering vomiting.  CT scan performed to rule out intracranial hemorrhage.  This was reviewed and negative.  We will focus on symptoms, but if unable to adequately treat symptoms she may require further imaging [DW]  ?0400 Pt reports return of dizziness, will monitor [DW]  ?0634 Patient attempted to ambulate but still felt very dizzy.  We will proceed with MRI brain [DW]  ?0700 Signed out to dr pickering at shift change to f/u on MRI Brain [DW]  ?  ?Clinical Course User Index ?[DW] Ripley Fraise, MD  ? ?                        ?Medical Decision Making ?Amount and/or Complexity of Data Reviewed ?Labs: ordered. Decision-making details documented in ED Course. ?Radiology: ordered. ? ?Risk ?Prescription drug management. ? ? ?This patient presents to the ED for concern of dizziness, this involves an extensive number of treatment options,  and is a complaint that carries with it a high risk of complications and morbidity.  The differential diagnosis includes vertigo, electrolyte imbalance, cardiac arrhythmia, CVA, intracranial hemorrhage ? ?Comorbidities that complicate the patient evaluation: ?Patient?s presentation is complicated by their history of hypertension ? ?Additional history obtained: ?Additional history obtained from family ?Records reviewed Primary Care Documents ? ?Lab Tests: ?I Ordered, and personally interpreted labs.  The pertinent results include: Mild hyperglycemia ? ?Imaging Studies ordered: ?I ordered imaging studies including CT scan head   ?I independently visualized and interpreted imaging which showed no acute finding ?I agree  with the radiologist interpretation ? ?Cardiac Monitoring: ?The patient was maintained on a cardiac monitor.  I personally viewed and interpreted the cardiac monitor which showed an underlying rhythm of:  sinus r

## 2021-09-06 NOTE — Discharge Instructions (Signed)
Call the ear nose and throat doctor and that should help arrange follow-up for you.  Take the Antivert to help with the symptoms. ?

## 2021-09-06 NOTE — ED Provider Notes (Signed)
?  Physical Exam  ?BP 131/65   Pulse 70   Temp 98.1 ?F (36.7 ?C) (Oral)   Resp 12   Ht '5\' 6"'$  (1.676 m)   Wt 95.3 kg   SpO2 100%   BMI 33.89 kg/m?  ? ?Physical Exam ? ?Procedures  ?Procedures ? ?ED Course / MDM  ? ?Clinical Course as of 09/06/21 0917  ?Tue Sep 06, 2021  ?0038 Glucose(!): 146 ?Hyperglycemia noted [DW]  ?0142 Patient presents with vertigo for over 24 hours.  It is triggering vomiting.  CT scan performed to rule out intracranial hemorrhage.  This was reviewed and negative.  We will focus on symptoms, but if unable to adequately treat symptoms she may require further imaging [DW]  ?0400 Pt reports return of dizziness, will monitor [DW]  ?0634 Patient attempted to ambulate but still felt very dizzy.  We will proceed with MRI brain [DW]  ?0700 Signed out to dr Shacarra Choe at shift change to f/u on MRI Brain [DW]  ?  ?Clinical Course User Index ?[DW] Ripley Fraise, MD  ? ?Medical Decision Making ?Amount and/or Complexity of Data Reviewed ?Labs: ordered. Decision-making details documented in ED Course. ?Radiology: ordered. ? ?Risk ?Prescription drug management. ? ? ?Received patient in signout.  Vertigo.  Feeling somewhat better after treatment but still feels somewhat dizzy.  MRI done and does not show acute stroke.  Had discussion with patient about the course of vertigo and follow-up.  Now feels we need admission to the hospital.  Urine does have some white cells but not having any urinary symptoms.  Will not treat at this time.  Doubt the source of the vertigo.  Will discharge home. ? ? ? ? ?  ?Davonna Belling, MD ?09/06/21 [phone] ? ?

## 2021-09-14 ENCOUNTER — Encounter: Payer: Self-pay | Admitting: Family Medicine

## 2021-09-14 ENCOUNTER — Ambulatory Visit (INDEPENDENT_AMBULATORY_CARE_PROVIDER_SITE_OTHER): Payer: Medicare Other | Admitting: Family Medicine

## 2021-09-14 DIAGNOSIS — R42 Dizziness and giddiness: Secondary | ICD-10-CM | POA: Diagnosis not present

## 2021-09-14 DIAGNOSIS — R2681 Unsteadiness on feet: Secondary | ICD-10-CM | POA: Diagnosis not present

## 2021-09-14 NOTE — Progress Notes (Signed)
Virtual Visit via Telephone Note ? ?I connected with DYNASIA KERCHEVAL on 09/14/21 at 11:40 AM EDT by telephone and verified that I am speaking with the correct person using two identifiers. ? ?Location: ?Patient: home ?Provider: office ?  ?I discussed the limitations, risks, security and privacy concerns of performing an evaluation and management service by telephone and the availability of in person appointments. I also discussed with the patient that there may be a patient responsible charge related to this service. The patient expressed understanding and agreed to proceed. ? ? ?History of Present Illness: ?Acute vertigo 03/27, stayed overnight in the ED dx of vertigo , now 80 % better, also had nausea and diarrheah at onset, no diarrheah and nausea is better, states balance is still off esp when she first starts walking, when making a sharp turn, fell up on sofa before going to hospital ?  ?Observations/Objective: ?There were no vitals taken for this visit. ?Good communication with no confusion and intact memory. ?Alert and oriented x 3 ?No signs of respiratory distress during speech ? ? ?Assessment and Plan: ?Vertigo ?Recent acute episode resolving , however reportsimbalance , unsteady gait and near fall, refer for PT ? ?Unsteady gait when walking ?Near fall with unsteady gait, needs PT x 1 month, twice weekly ? ? ?Follow Up Instructions: ? ?  ?I discussed the assessment and treatment plan with the patient. The patient was provided an opportunity to ask questions and all were answered. The patient agreed with the plan and demonstrated an understanding of the instructions. ?  ?The patient was advised to call back or seek an in-person evaluation if the symptoms worsen or if the condition fails to improve as anticipated. ? ?I provided 12 minutes of non-face-to-face time during this encounter. ? ? ?[name], MD ? ?

## 2021-09-14 NOTE — Patient Instructions (Addendum)
F/u in 8 weeks , re evaluate vertigo and light headedness, call if you need me sooner ? ?You are being referred to physical therapy twice weekly for 4 weeks ? ?Careful not to fall ? ?Thanks for choosing [facility], we consider it a privelige to serve you. ? ?

## 2021-09-19 ENCOUNTER — Encounter: Payer: Self-pay | Admitting: Family Medicine

## 2021-09-19 NOTE — Assessment & Plan Note (Signed)
Near fall with unsteady gait, needs PT x 1 month, twice weekly ?

## 2021-09-19 NOTE — Assessment & Plan Note (Signed)
Recent acute episode resolving , however reportsimbalance , unsteady gait and near fall, refer for PT ?

## 2021-10-07 ENCOUNTER — Other Ambulatory Visit: Payer: Self-pay | Admitting: Family Medicine

## 2021-10-18 ENCOUNTER — Ambulatory Visit (HOSPITAL_COMMUNITY): Payer: Medicare Other | Attending: Family Medicine | Admitting: Physical Therapy

## 2021-10-18 ENCOUNTER — Encounter (HOSPITAL_COMMUNITY): Payer: Self-pay | Admitting: Physical Therapy

## 2021-10-18 DIAGNOSIS — R2681 Unsteadiness on feet: Secondary | ICD-10-CM | POA: Insufficient documentation

## 2021-10-18 DIAGNOSIS — R42 Dizziness and giddiness: Secondary | ICD-10-CM | POA: Insufficient documentation

## 2021-10-18 NOTE — Therapy (Signed)
?OUTPATIENT PHYSICAL THERAPY VESTIBULAR EVALUATION ? ? ? ? ?Patient Name: Karen [name] ?MRN: [id number] ?DOB:Jan 18, 1946, 76 y.o., female ?Today's Date: 10/18/2021 ? ?PCP: Fayrene Helper, MD  ?REFERRING PROVIDER: Fayrene Helper, MD  ? ? PT End of Session - 10/18/21 1527   ? ? Visit Number 1   ? Number of Visits 3   ? Date for PT Re-Evaluation 11/01/21   ? Authorization Type [employer] MEdicare (no VL)   ? PT Start Time 1435   ? PT Stop Time 1530   ? PT Time Calculation (min) 55 min   ? Activity Tolerance Patient tolerated treatment well   ? Behavior During Therapy [facility] for tasks assessed/performed   ? ?  ?  ? ?  ? ? ?Past Medical History:  ?Diagnosis Date  ? Arthritis   ? C. difficile colitis Jul 18, 2013  ? Vancomycin. Recheck Cdiff positive. Vancomycin continued for an additional 2 weeks  ? H/O herpes zoster   ? Hyperlipidemia   ? Hypertension   ? Metabolic syndrome X   ? Pes anserinus tendinitis 11/22/2011  ? Prediabetes 2014  ? lifestyle   ? Prediabetes   ? ?Past Surgical History:  ?Procedure Laterality Date  ? ABDOMINAL HYSTERECTOMY  1983 approx  ? fibroids, partial  ? ANKLE SURGERY    ? COLONOSCOPY  04/27/2005  ? [id number] polyp at the splenic flexure cold biopsied/removed. Mid  descending polyp removed with cold snare technique. The remainder of the of the colon looked normal/ Normal rectum. inflammed adenomatous polyps.  ? COLONOSCOPY N/A 05/26/2013  ? Dr. Rourk:Multiple colonic polyps removed, tubular adenomas. Needs surveillance Dec 2017.   ? COLONOSCOPY N/A 05/24/2017  ? Procedure: COLONOSCOPY;  Surgeon: Daneil Dolin, MD;  [facility];  Service: Endoscopy;  Laterality: N/A;  1:00 pm  ? FRACTURE SURGERY Right mid 1990's  ? pin put in and waas removed  ? ?Patient Active Problem List  ? Diagnosis Date Noted  ? Vertigo 09/14/2021  ? Unsteady gait when walking 09/14/2021  ? Anxiety 08/08/2021  ? Back pain with right-sided radiculopathy 04/07/2020  ? Vitamin D deficiency 11/07/2019  ?  Posterior left knee pain 05/03/2019  ? Varicose veins of both lower extremities 08/13/2017  ? Osteopenia 12/21/2014  ? Seasonal allergies 06/07/2014  ? Goiter, nontoxic, multinodular 07/31/2013  ? Hx of adenomatous colonic polyps 05/01/2013  ? Metabolic syndrome X 16/38/4536  ? Osteoarthritis of knee 11/22/2011  ? Hyperlipidemia LDL goal <100 01/02/2008  ? Obesity (BMI 30.0-34.9) 01/02/2008  ? Essential hypertension, benign 01/02/2008  ? ? ?ONSET DATE: around 09/10/21 ? ?REFERRING DIAG: R42 (ICD-10-CM) - Vertigo R26.81 (ICD-10-CM) - Unsteady gait when walking  ? ?THERAPY DIAG:  ?Dizziness and giddiness ? ?SUBJECTIVE:  ? ?SUBJECTIVE STATEMENT: ?Patient presents to therapy with complaint of vertigo. About a month ago she had a dizzy spell getting up form her couch. She felt very dizzy and nauseas. She when to hospital and had brain scans which were normal. She was given meclizine and has been taking PRN. Has not had as bad an episode since then but still feel "woozy" occasionally. She must wait a little before getting up in the morning. She is reluctant to drive. She has not had any recent falls.  ?Pt accompanied by: self ? ?PERTINENT HISTORY: NA  ? ? ?PAIN:  ?Are you having pain? No ? ?PRECAUTIONS: Fall ? ?WEIGHT BEARING RESTRICTIONS No ? ?FALLS: Has patient fallen in last 6 months? No ? ?LIVING ENVIRONMENT: ?Lives with:  lives with their spouse ?Lives in: House/apartment ?Stairs: Yes: External: 5 steps; on left going up ?Has following equipment at home: Single point cane ? ?PLOF: Independent ? ?PATIENT GOALS  to control it  ? ?OBJECTIVE:  ? ?DIAGNOSTIC FINDINGS: IMPRESSION: ?Mild atrophic changes without acute abnormality. ? IMPRESSION: ?No acute intracranial abnormality and essentially normal for age ?noncontrast MRI appearance of the brain. ?  ? ?COGNITION: ?Overall cognitive status: Within functional limits for tasks assessed ?  ?SENSATION: ?WFL ? ? ?Cervical ROM:  WFL ? ? ? ?STRENGTH: no concerns ? ? ? ? ?PATIENT  SURVEYS:  ?FOTO 46% function  ? ? ?VESTIBULAR ASSESSMENT ? ?  ? SYMPTOM BEHAVIOR: ?  Subjective history: See above ?  Non-Vestibular symptoms: nausea/vomiting ?  Type of dizziness: Spinning/Vertigo ?  Frequency: Weekly  ?  Duration: seconds ?  Aggravating factors:  head movement  ?  Relieving factors:  sitting still, closing eyes ?  Progression of symptoms: better ? ? OCULOMOTOR EXAM: ?  Ocular Alignment: normal ?  Ocular ROM:  WNL ?  Spontaneous Nystagmus: absent ?  Gaze-Induced Nystagmus: absent ?  Smooth Pursuits: intact ?   ? ?  ? POSITIONAL TESTING: Right Dix-Hallpike: positive, about 5 seconds, no nystagmus; Duration:5 sec ?Left Dix-Hallpike: positive, about 15 seconds, no nystagmus; Duration: 15 sec ?  ? ?MOTION SENSITIVITY: ? ?  Motion Sensitivity Quotient ? ?Intensity: 0 = none, 1 = Lightheaded, 2 = Mild, 3 = Moderate, 4 = Severe, 5 = Vomiting ? Intensity  ?1. Sitting to supine 0  ?2. Supine to L side 0  ?3. Supine to R side 0  ?4. Supine to sitting 0  ?5. L Hallpike-Dix 2  ?6. Up from L  0  ?7. R Hallpike-Dix 2  ?8. Up from R  0  ?9. Sitting, head  ?tipped to L knee   ?10. Head up from L  ?knee   ?11. Sitting, head  ?tipped to R knee   ?12. Head up from R  ?knee   ?13. Sitting head turns x5 2  ?14.Sitting head nods x5 0  ?15. In stance, 180?  ?turn to L    ?16. In stance, 180?  ?turn to R   ?  ? ?VESTIBULAR TREATMENT: ?10/18/21 seated head turns  ? ? ?PATIENT EDUCATION: ?Education details: on eval findings, POC and HEP  ?Person educated: Patient ?Education method: Explanation ?Education comprehension: verbalized understanding and returned demonstration ? ? ?GOALS: ? ? ?SHORT TERM GOALS: Target date: 11/01/2021 ? ?Patient will be independent with initial HEP and self-management strategies to improve functional outcomes ?Baseline:  ?Goal status: INITIAL  ? 2. Patient will report full resolution of positional vertigo for improved funcitonal outcomes and ADL performance.  ? ?ASSESSMENT: ? ?CLINICAL  IMPRESSION: ?Patient is a 76 y.o. female who presents to physical therapy with complaint of vertigo. Patient demonstrates increased vestibular symptoms with provocative testing which is negatively impacting patient ability to perform ADLs and functional mobility tasks. Patient will benefit from skilled physical therapy services to address these deficits to improve level of function with ADLs, functional mobility tasks, and reduce risk for falls.  ? ?OBJECTIVE IMPAIRMENTS decreased activity tolerance and dizziness.  ? ?ACTIVITY LIMITATIONS cleaning, community activity, driving, yard work, shopping, and yard work.  ? ?PERSONAL FACTORS  None  are also affecting patient's functional outcome.  ? ? ?REHAB POTENTIAL: Good ? ?CLINICAL DECISION MAKING: Stable/uncomplicated ? ?EVALUATION COMPLEXITY: Low ? ? ?PLAN: ?PT FREQUENCY: 1x/week ? ?PT DURATION: 2 weeks ? ?PLANNED  INTERVENTIONS: Therapeutic exercises, Therapeutic activity, Neuromuscular re-education, Balance training, Gait training, Patient/Family education, Joint mobilization, Vestibular training, and Canalith repositioning ?Therapeutic exercises, Therapeutic activity, Neuromuscular re-education, Balance training, Gait training, Patient/Family education, Joint manipulation, Joint mobilization, Stair training, Aquatic Therapy, Dry Needling, Electrical stimulation, Spinal manipulation, Spinal mobilization, Cryotherapy, Moist heat, scar mobilization, Taping, Traction, Ultrasound, Biofeedback, Ionotophoresis 38m/ml Dexamethasone, and Manual therapy. ? ?PLAN FOR NEXT SESSION: Review HEP and assess response. F/u with sx after [place]. Repeat if needed. DC if all goals met.  ? ?3:37 PM, 10/18/21 ?CJosue HectorPT DPT  ?Physical Therapist with [place] [facility] ?[phone]? ?

## 2021-10-25 ENCOUNTER — Ambulatory Visit (HOSPITAL_COMMUNITY): Payer: Medicare Other | Admitting: Physical Therapy

## 2021-10-25 ENCOUNTER — Encounter (HOSPITAL_COMMUNITY): Payer: Self-pay | Admitting: Physical Therapy

## 2021-10-25 DIAGNOSIS — R42 Dizziness and giddiness: Secondary | ICD-10-CM

## 2021-10-25 DIAGNOSIS — R2681 Unsteadiness on feet: Secondary | ICD-10-CM | POA: Diagnosis not present

## 2021-10-25 NOTE — Therapy (Signed)
?OUTPATIENT PHYSICAL THERAPY TREATMENT NOTE ? ? ?Patient Name: Karen [name] ?MRN: [id number] ?DOB:1945-12-13, 76 y.o., female ?Today's Date: 10/25/2021 ? ?PHYSICAL THERAPY DISCHARGE SUMMARY ? ?Visits from Start of Care: 2 ? ?Current functional level related to goals / functional outcomes: ?See below  ?  ?Remaining deficits: ?See below  ?  ?Education / Equipment: ?See assessment   ? ?Patient agrees to discharge. Patient goals were met. Patient is being discharged due to meeting the stated rehab goals. ? ?PCP: Fayrene Helper, MD  ?REFERRING PROVIDER: Fayrene Helper, MD  ? ?END OF SESSION:  ? PT End of Session - 10/25/21 1113   ? ? Visit Number 2   ? Number of Visits 3   ? Date for PT Re-Evaluation 11/01/21   ? Authorization Type UHC MEdicare (no VL)   ? PT Start Time 1116   ? PT Stop Time 2725   ? PT Time Calculation (min) 15 min   ? Activity Tolerance Patient tolerated treatment well   ? Behavior During Therapy [facility] for tasks assessed/performed   ? ?  ?  ? ?  ? ? ?Past Medical History:  ?Diagnosis Date  ? Arthritis   ? C. difficile colitis Jul 18, 2013  ? Vancomycin. Recheck Cdiff positive. Vancomycin continued for an additional 2 weeks  ? H/O herpes zoster   ? Hyperlipidemia   ? Hypertension   ? Metabolic syndrome X   ? Pes anserinus tendinitis 11/22/2011  ? Prediabetes 2014  ? lifestyle   ? Prediabetes   ? ?Past Surgical History:  ?Procedure Laterality Date  ? ABDOMINAL HYSTERECTOMY  1983 approx  ? fibroids, partial  ? ANKLE SURGERY    ? COLONOSCOPY  04/27/2005  ? [id number] polyp at the splenic flexure cold biopsied/removed. Mid  descending polyp removed with cold snare technique. The remainder of the of the colon looked normal/ Normal rectum. inflammed adenomatous polyps.  ? COLONOSCOPY N/A 05/26/2013  ? Dr. Rourk:Multiple colonic polyps removed, tubular adenomas. Needs surveillance Dec 2017.   ? COLONOSCOPY N/A 05/24/2017  ? Procedure: COLONOSCOPY;  Surgeon: Daneil Dolin, MD;  [facility];  Service: Endoscopy;  Laterality: N/A;  1:00 pm  ? FRACTURE SURGERY Right mid 1990's  ? pin put in and waas removed  ? ?Patient Active Problem List  ? Diagnosis Date Noted  ? Vertigo 09/14/2021  ? Unsteady gait when walking 09/14/2021  ? Anxiety 08/08/2021  ? Back pain with right-sided radiculopathy 04/07/2020  ? Vitamin D deficiency 11/07/2019  ? Posterior left knee pain 05/03/2019  ? Varicose veins of both lower extremities 08/13/2017  ? Osteopenia 12/21/2014  ? Seasonal allergies 06/07/2014  ? Goiter, nontoxic, multinodular 07/31/2013  ? Hx of adenomatous colonic polyps 05/01/2013  ? Metabolic syndrome X 56/38/7564  ? Osteoarthritis of knee 11/22/2011  ? Hyperlipidemia LDL goal <100 01/02/2008  ? Obesity (BMI 30.0-34.9) 01/02/2008  ? Essential hypertension, benign 01/02/2008  ? ? ?REFERRING DIAG: R42 (ICD-10-CM) - Vertigo R26.81 (ICD-10-CM) - Unsteady gait when walking  ? ?THERAPY DIAG:  ?Dizziness and giddiness ? ?PERTINENT HISTORY: NA ? ?PRECAUTIONS: Fall ? ?SUBJECTIVE: Patient reports 95% improved since evaluation. She has not had any episodes of vertigo since having repositioning maneuver performed. She has not been having to take meclizine either.  ? ?PAIN:  ?Are you having pain? No ? ?  ?OBJECTIVE:  ?  ?DIAGNOSTIC FINDINGS: IMPRESSION: ?Mild atrophic changes without acute abnormality. ? IMPRESSION: ?No acute intracranial abnormality and essentially normal for age ?noncontrast MRI  appearance of the brain. ?  ?  ?COGNITION: ?Overall cognitive status: Within functional limits for tasks assessed ?            ?SENSATION: ?WFL ?  ?  ?Cervical ROM:  WFL ?  ?  ?  ?STRENGTH: no concerns ?  ?  ?  ?  ?PATIENT SURVEYS:  ?FOTO 46% function  ?  ?  ?VESTIBULAR ASSESSMENT ?  ?            ?           SYMPTOM BEHAVIOR: ?                      Subjective history: See above ?                      Non-Vestibular symptoms: nausea/vomiting ?                      Type of dizziness: Spinning/Vertigo ?                       Frequency: Weekly  ?                      Duration: seconds ?                      Aggravating factors:  head movement  ?                      Relieving factors:  sitting still, closing eyes ?                      Progression of symptoms: better ?  ?           OCULOMOTOR EXAM: ?                      Ocular Alignment: normal ?                      Ocular ROM:  WNL ?                      Spontaneous Nystagmus: absent ?                      Gaze-Induced Nystagmus: absent ?                      Smooth Pursuits: intact ?                       ?  ?            ?           POSITIONAL TESTING: Dix-Hallpike (negative bilateral)  ?            ?  ?MOTION SENSITIVITY: ?  ?                      Motion Sensitivity Quotient ?  ?Intensity: 0 = none, 1 = Lightheaded, 2 = Mild, 3 = Moderate, 4 = Severe, 5 = Vomiting ?  Intensity  ?1. Sitting to supine 0  ?2. Supine to L side 0  ?3. Supine to R side 0  ?4. Supine to sitting 0  ?5. L Hallpike-Dix 0  ?  6. Up from L  0  ?7. R Hallpike-Dix 0  ?8. Up from R  0  ?9. Sitting, head  ?tipped to L knee    ?10. Head up from L  ?knee    ?11. Sitting, head  ?tipped to R knee    ?12. Head up from R  ?knee    ?13. Sitting head turns x5 0  ?14.Sitting head nods x5 0  ?15. In stance, 180?  ?turn to L     ?16. In stance, 180?  ?turn to R    ?            ?  ?VESTIBULAR TREATMENT: ?10/18/21 seated head turns  ?  ?  ?PATIENT EDUCATION: ?Education details: on progress, exam findings and DC status  ?Person educated: Patient ?Education method: Explanation ?Education comprehension: verbalized understanding and returned demonstration ?  ?  ?GOALS: ?  ?  ?SHORT TERM GOALS: Target date: 11/01/2021 ?  ?Patient will be independent with initial HEP and self-management strategies to improve functional outcomes ?Baseline:  ?Goal status: MET ? ?    2. Patient will report full resolution of positional vertigo for improved funcitonal outcomes and ADL performance.  ? Baseline:  ?Goal status: MET ? ? ?ASSESSMENT: ?  ?CLINICAL  IMPRESSION: ?Patient reports full resolution of symptoms. No states deficits otherwise. Retested DixHallpike with negative result bilaterally. Answered all patient questions. Patient being DC today with all goals met. Encouraged patient to follow up with therapy services with nay further questions or concerns.  ?  ?OBJECTIVE IMPAIRMENTS decreased activity tolerance and dizziness.  ?  ?ACTIVITY LIMITATIONS cleaning, community activity, driving, yard work, shopping, and yard work.  ?  ?PERSONAL FACTORS  None  are also affecting patient's functional outcome.  ?  ?  ?REHAB POTENTIAL: Good ?  ?CLINICAL DECISION MAKING: Stable/uncomplicated ?  ?EVALUATION COMPLEXITY: Low ?  ?  ?PLAN: ?PT FREQUENCY: 1x/week ?  ?PT DURATION: 2 weeks ?  ?PLANNED INTERVENTIONS: Therapeutic exercises, Therapeutic activity, Neuromuscular re-education, Balance training, Gait training, Patient/Family education, Joint mobilization, Vestibular training, and Canalith repositioning ?Therapeutic exercises, Therapeutic activity, Neuromuscular re-education, Balance training, Gait training, Patient/Family education, Joint manipulation, Joint mobilization, Stair training, Aquatic Therapy, Dry Needling, Electrical stimulation, Spinal manipulation, Spinal mobilization, Cryotherapy, Moist heat, scar mobilization, Taping, Traction, Ultrasound, Biofeedback, Ionotophoresis 36m/ml Dexamethasone, and Manual therapy. ?  ?PLAN FOR NEXT SESSION: DC ? ? ?11:48 AM, 10/25/21 ?CJosue HectorPT DPT  ?Physical Therapist [place] ?[facility] ?[phone]? ? ?   ?

## 2021-11-03 DIAGNOSIS — E559 Vitamin D deficiency, unspecified: Secondary | ICD-10-CM | POA: Diagnosis not present

## 2021-11-03 DIAGNOSIS — E785 Hyperlipidemia, unspecified: Secondary | ICD-10-CM | POA: Diagnosis not present

## 2021-11-03 DIAGNOSIS — I10 Essential (primary) hypertension: Secondary | ICD-10-CM | POA: Diagnosis not present

## 2021-11-04 LAB — CMP14+EGFR
ALT: 18 IU/L (ref 0–32)
AST: 23 IU/L (ref 0–40)
Albumin/Globulin Ratio: 1.5 (ref 1.2–2.2)
Albumin: 4.3 g/dL (ref 3.7–4.7)
Alkaline Phosphatase: 81 IU/L (ref 44–121)
BUN/Creatinine Ratio: 16 (ref 12–28)
BUN: 13 mg/dL (ref 8–27)
Bilirubin Total: 0.7 mg/dL (ref 0.0–1.2)
CO2: 25 mmol/L (ref 20–29)
Calcium: 10.2 mg/dL (ref 8.7–10.3)
Chloride: 101 mmol/L (ref 96–106)
Creatinine, Ser: 0.81 mg/dL (ref 0.57–1.00)
Globulin, Total: 2.8 g/dL (ref 1.5–4.5)
Glucose: 87 mg/dL (ref 70–99)
Potassium: 4.2 mmol/L (ref 3.5–5.2)
Sodium: 138 mmol/L (ref 134–144)
Total Protein: 7.1 g/dL (ref 6.0–8.5)
eGFR: 76 mL/min/{1.73_m2} (ref >59)

## 2021-11-04 LAB — VITAMIN D 25 HYDROXY (VIT D DEFICIENCY, FRACTURES): Vit D, 25-Hydroxy: 40.1 ng/mL (ref 30.0–100.0)

## 2021-11-04 LAB — CBC
Hematocrit: 37.5 % (ref 34.0–46.6)
Hemoglobin: 12.6 g/dL (ref 11.1–15.9)
MCH: 30.4 pg (ref 26.6–33.0)
MCHC: 33.6 g/dL (ref 31.5–35.7)
MCV: 91 fL (ref 79–97)
Platelets: 330 10*3/uL (ref 150–450)
RBC: 4.14 x10E6/uL (ref 3.77–5.28)
RDW: 12.2 % (ref 11.7–15.4)
WBC: 4.2 10*3/uL (ref 3.4–10.8)

## 2021-11-04 LAB — LIPID PANEL
Chol/HDL Ratio: 3.7 ratio (ref 0.0–4.4)
Cholesterol, Total: 209 mg/dL — ABNORMAL HIGH (ref 100–199)
HDL: 56 mg/dL (ref >39)
LDL Chol Calc (NIH): 135 mg/dL — ABNORMAL HIGH (ref 0–99)
Triglycerides: 100 mg/dL (ref 0–149)
VLDL Cholesterol Cal: 18 mg/dL (ref 5–40)

## 2021-11-04 LAB — TSH: TSH: 0.905 u[IU]/mL (ref 0.450–4.500)

## 2021-11-09 ENCOUNTER — Encounter: Payer: Self-pay | Admitting: Family Medicine

## 2021-11-09 ENCOUNTER — Encounter (HOSPITAL_COMMUNITY): Payer: Medicare Other | Admitting: Physical Therapy

## 2021-11-09 ENCOUNTER — Ambulatory Visit (INDEPENDENT_AMBULATORY_CARE_PROVIDER_SITE_OTHER): Payer: Medicare Other | Admitting: Family Medicine

## 2021-11-09 VITALS — BP 138/74 | HR 75 | Ht 66.0 in | Wt 215.1 lb

## 2021-11-09 DIAGNOSIS — I10 Essential (primary) hypertension: Secondary | ICD-10-CM

## 2021-11-09 DIAGNOSIS — L299 Pruritus, unspecified: Secondary | ICD-10-CM | POA: Diagnosis not present

## 2021-11-09 DIAGNOSIS — M1711 Unilateral primary osteoarthritis, right knee: Secondary | ICD-10-CM

## 2021-11-09 DIAGNOSIS — E669 Obesity, unspecified: Secondary | ICD-10-CM | POA: Diagnosis not present

## 2021-11-09 DIAGNOSIS — E785 Hyperlipidemia, unspecified: Secondary | ICD-10-CM | POA: Diagnosis not present

## 2021-11-09 MED ORDER — SIMVASTATIN 20 MG PO TABS: 20.0000 mg | ORAL_TABLET | Freq: Every day | ORAL | 4 refills | Status: DC

## 2021-11-09 MED ORDER — SEMAGLUTIDE-WEIGHT MANAGEMENT 0.25 MG/0.5ML ~~LOC~~ SOAJ
0.2500 mg | SUBCUTANEOUS | 0 refills | Status: DC
Start: 2021-11-09 — End: 2021-12-09

## 2021-11-09 NOTE — Assessment & Plan Note (Signed)
Pain and stiffnerss, needs to lose weight

## 2021-11-09 NOTE — Patient Instructions (Addendum)
Keep September appointment, call if you need me sooner  Need to change food choice as discussed to lose weight and improve cholesterol  Increased dose simvastatin 20 mg daily  Wegovy prescribed r to help with weight pls send message re coverage, an alternative as we discussed is phentermine  Fasting lipid, cmp and eGFr 5 days before next visit  It is important that you exercise regularly at least 30 minutes 5 times a week. If you develop chest pain, have severe difficulty breathing, or feel very tired, stop exercising immediately and seek medical attention  Thanks for choosing [facility], we consider it a privelige to serve you.

## 2021-11-09 NOTE — Assessment & Plan Note (Signed)
  Patient re-educated about  the importance of commitment to a  minimum of 150 minutes of exercise per week as able.  The importance of healthy food choices with portion control discussed, as well as eating regularly and within a 12 hour window most days. The need to choose "clean , green" food 50 to 75% of the time is discussed, as well as to make water the primary drink and set a goal of 64 ounces water daily.       11/09/2021    8:07 AM 09/05/2021   10:02 PM 08/02/2021    8:09 AM  Weight /BMI  Weight 215 lb 1.3 oz 210 lb 210 lb  Height '5\' 6"'$  (1.676 m) '5\' 6"'$  (1.676 m) '5\' 6"'$  (1.676 m)  BMI 34.71 kg/m2 33.89 kg/m2 33.89 kg/m2    Trial of wegovy or phentermine

## 2021-11-09 NOTE — Progress Notes (Signed)
Karen [name]     MRN: [id number]      DOB: 12/18/1945   HPI Karen [name] is here for follow up and re-evaluation of chronic medical conditions, medication management and review of any available recent lab and radiology data.  Preventive health is updated, specifically  Cancer screening and Immunization.   Questions or concerns regarding consultations or procedures which the PT has had in the interim are  addressed. The PT denies any adverse reactions to current medications since the last visit.  There are no new concerns. Requests med to help with weight loss There are no specific complaints   ROS Denies recent fever or chills. Denies sinus pressure, nasal congestion, ear pain or sore throat. Denies chest congestion, productive cough or wheezing. Denies chest pains, palpitations and leg swelling Denies abdominal pain, nausea, vomiting,diarrhea or constipation.   Denies dysuria, frequency, hesitancy or incontinence. C/o joint pain, swelling and limitation in mobility. Denies headaches, seizures, numbness, or tingling. Denies depression, anxiety or insomnia. Denies skin break down or rash.   PE  BP 138/74   Pulse 75   Ht '5\' 6"'$  (1.676 m)   Wt 215 lb 1.3 oz (97.6 kg)   SpO2 97%   BMI 34.71 kg/m    Patient alert and oriented and in no cardiopulmonary distress.  HEENT: No facial asymmetry, EOMI,     Neck supple .  Chest: Clear to auscultation bilaterally.  CVS: S1, S2 no murmurs, no S3.Regular rate.  ABD: Soft non tender.   Ext: No edema  MS: Adequate ROM spine, shoulders, hips and  reduced in knees  Skin: Intact, no ulcerations or rash noted.  Psych: Good eye contact, normal affect. Memory intact not anxious or depressed appearing.  CNS: CN 2-12 intact, power,  normal throughout.no focal deficits noted.   Assessment & Plan  Essential hypertension, benign Sub optimal control  DASH diet and commitment to daily physical activity for a minimum of 30 minutes  discussed and encouraged, as a part of hypertension management. The importance of attaining a healthy weight is also discussed.     11/09/2021    8:34 AM 11/09/2021    8:07 AM 09/06/2021    9:30 AM 09/06/2021    9:06 AM 09/06/2021    8:00 AM 09/06/2021    7:30 AM 09/06/2021    7:00 AM  BP/Weight  Systolic BP 376 283 151 761 [phone]  Diastolic BP 74 75 63 65 62 65 58  Wt. (Lbs)  215.08       BMI  34.71 kg/m2            Hyperlipidemia LDL goal <100 Hyperlipidemia:Low fat diet discussed and encouraged.   Lipid Panel  Lab Results  Component Value Date   CHOL 209 (H) 11/03/2021   HDL 56 11/03/2021   LDLCALC 135 (H) 11/03/2021   TRIG 100 11/03/2021   CHOLHDL 3.7 11/03/2021     Inc zocor   Obesity (BMI 30.0-34.9)  Patient re-educated about  the importance of commitment to a  minimum of 150 minutes of exercise per week as able.  The importance of healthy food choices with portion control discussed, as well as eating regularly and within a 12 hour window most days. The need to choose "clean , green" food 50 to 75% of the time is discussed, as well as to make water the primary drink and set a goal of 64 ounces water daily.       11/09/2021  8:07 AM 09/05/2021   10:02 PM 08/02/2021    8:09 AM  Weight /BMI  Weight 215 lb 1.3 oz 210 lb 210 lb  Height '5\' 6"'$  (1.676 m) '5\' 6"'$  (1.676 m) '5\' 6"'$  (1.676 m)  BMI 34.71 kg/m2 33.89 kg/m2 33.89 kg/m2    Trial of wegovy or phentermine  Osteoarthritis of knee Pain and stiffnerss, needs to lose weight  Pruritus Rash and itching with whelps, will take benadryl when needed and monitor

## 2021-11-09 NOTE — Assessment & Plan Note (Signed)
Rash and itching with whelps, will take benadryl when needed and monitor

## 2021-11-09 NOTE — Assessment & Plan Note (Signed)
Hyperlipidemia:Low fat diet discussed and encouraged.   Lipid Panel  Lab Results  Component Value Date   CHOL 209 (H) 11/03/2021   HDL 56 11/03/2021   LDLCALC 135 (H) 11/03/2021   TRIG 100 11/03/2021   CHOLHDL 3.7 11/03/2021     Inc zocor

## 2021-11-09 NOTE — Assessment & Plan Note (Signed)
Sub optimal control  DASH diet and commitment to daily physical activity for a minimum of 30 minutes discussed and encouraged, as a part of hypertension management. The importance of attaining a healthy weight is also discussed.     11/09/2021    8:34 AM 11/09/2021    8:07 AM 09/06/2021    9:30 AM 09/06/2021    9:06 AM 09/06/2021    8:00 AM 09/06/2021    7:30 AM 09/06/2021    7:00 AM  BP/Weight  Systolic BP 355 732 202 542 [phone]  Diastolic BP 74 75 63 65 62 65 58  Wt. (Lbs)  215.08       BMI  34.71 kg/m2

## 2021-11-09 NOTE — Addendum Note (Signed)
Addended by: Eual Fines on: 11/09/2021 09:04 AM   Modules accepted: Orders

## 2021-11-21 ENCOUNTER — Other Ambulatory Visit: Payer: Self-pay | Admitting: Family Medicine

## 2021-12-09 ENCOUNTER — Other Ambulatory Visit: Payer: Self-pay | Admitting: Family Medicine

## 2021-12-09 ENCOUNTER — Telehealth: Payer: Self-pay | Admitting: Family Medicine

## 2021-12-09 MED ORDER — PHENTERMINE HCL 15 MG PO CAPS: 15.0000 mg | ORAL_CAPSULE | ORAL | 2 refills | Status: DC

## 2021-12-09 NOTE — Telephone Encounter (Signed)
Patient aware.

## 2021-12-09 NOTE — Telephone Encounter (Signed)
LMTRC

## 2021-12-09 NOTE — Telephone Encounter (Signed)
Pt called stating that her insurance will not cover the medication. She is wanting to know if she can please get something that her insu would cover?   Semaglutide-Weight Management 0.25 MG/0.5ML    [employer]

## 2021-12-30 ENCOUNTER — Ambulatory Visit (HOSPITAL_COMMUNITY)
Admission: RE | Admit: 2021-12-30 | Discharge: 2021-12-30 | Disposition: A | Discharge status: Home / Self Care | Payer: Medicare Other | Source: Ambulatory Visit | Attending: Family Medicine | Admitting: Family Medicine

## 2021-12-30 DIAGNOSIS — Z1231 Encounter for screening mammogram for malignant neoplasm of breast: Secondary | ICD-10-CM | POA: Insufficient documentation

## 2022-02-14 ENCOUNTER — Other Ambulatory Visit: Payer: Self-pay | Admitting: Family Medicine

## 2022-03-02 ENCOUNTER — Encounter: Payer: Medicare Other | Admitting: Family Medicine

## 2022-03-15 ENCOUNTER — Encounter: Payer: Medicare Other | Admitting: Family Medicine

## 2022-03-31 DIAGNOSIS — I10 Essential (primary) hypertension: Secondary | ICD-10-CM | POA: Diagnosis not present

## 2022-03-31 DIAGNOSIS — E785 Hyperlipidemia, unspecified: Secondary | ICD-10-CM | POA: Diagnosis not present

## 2022-04-01 LAB — CMP14+EGFR
ALT: 22 IU/L (ref 0–32)
AST: 21 IU/L (ref 0–40)
Albumin/Globulin Ratio: 1.8 (ref 1.2–2.2)
Albumin: 4.2 g/dL (ref 3.8–4.8)
Alkaline Phosphatase: 88 IU/L (ref 44–121)
BUN/Creatinine Ratio: 19 (ref 12–28)
BUN: 17 mg/dL (ref 8–27)
Bilirubin Total: 0.4 mg/dL (ref 0.0–1.2)
CO2: 24 mmol/L (ref 20–29)
Calcium: 10 mg/dL (ref 8.7–10.3)
Chloride: 103 mmol/L (ref 96–106)
Creatinine, Ser: 0.89 mg/dL (ref 0.57–1.00)
Globulin, Total: 2.4 g/dL (ref 1.5–4.5)
Glucose: 94 mg/dL (ref 70–99)
Potassium: 4.3 mmol/L (ref 3.5–5.2)
Sodium: 140 mmol/L (ref 134–144)
Total Protein: 6.6 g/dL (ref 6.0–8.5)
eGFR: 68 mL/min/{1.73_m2} (ref >59)

## 2022-04-01 LAB — LIPID PANEL
Chol/HDL Ratio: 3.5 ratio (ref 0.0–4.4)
Cholesterol, Total: 196 mg/dL (ref 100–199)
HDL: 56 mg/dL (ref >39)
LDL Chol Calc (NIH): 126 mg/dL — ABNORMAL HIGH (ref 0–99)
Triglycerides: 79 mg/dL (ref 0–149)
VLDL Cholesterol Cal: 14 mg/dL (ref 5–40)

## 2022-04-06 ENCOUNTER — Encounter: Payer: Self-pay | Admitting: *Deleted

## 2022-04-06 ENCOUNTER — Ambulatory Visit (INDEPENDENT_AMBULATORY_CARE_PROVIDER_SITE_OTHER): Payer: Medicare Other | Admitting: Family Medicine

## 2022-04-06 ENCOUNTER — Encounter: Payer: Self-pay | Admitting: Family Medicine

## 2022-04-06 VITALS — BP 126/71 | HR 80 | Ht 66.0 in | Wt 206.8 lb

## 2022-04-06 DIAGNOSIS — Z1211 Encounter for screening for malignant neoplasm of colon: Secondary | ICD-10-CM

## 2022-04-06 DIAGNOSIS — Z0001 Encounter for general adult medical examination with abnormal findings: Secondary | ICD-10-CM | POA: Diagnosis not present

## 2022-04-06 DIAGNOSIS — R002 Palpitations: Secondary | ICD-10-CM | POA: Insufficient documentation

## 2022-04-06 DIAGNOSIS — E785 Hyperlipidemia, unspecified: Secondary | ICD-10-CM

## 2022-04-06 DIAGNOSIS — I10 Essential (primary) hypertension: Secondary | ICD-10-CM | POA: Diagnosis not present

## 2022-04-06 DIAGNOSIS — T733XXA Exhaustion due to excessive exertion, initial encounter: Secondary | ICD-10-CM | POA: Diagnosis not present

## 2022-04-06 DIAGNOSIS — E559 Vitamin D deficiency, unspecified: Secondary | ICD-10-CM

## 2022-04-06 NOTE — Assessment & Plan Note (Signed)
notes poor exercise tolerance with exertional fatigue in past 2 to 3 months, will have cardiology eval

## 2022-04-06 NOTE — Assessment & Plan Note (Signed)
Annual exam as documented. Counseling done  re healthy lifestyle involving commitment to 150 minutes exercise per week, heart healthy diet, and attaining healthy weight.The importance of adequate sleep also discussed. Changes in health habits are decided on by the patient with goals and time frames  set for achieving them. Immunization and cancer screening needs are specifically addressed at this visit. 

## 2022-04-06 NOTE — Assessment & Plan Note (Signed)
Reports intermittent palpitstions mainly with activity in past 2 to 3 months, cardiology to eval

## 2022-04-06 NOTE — Patient Instructions (Signed)
Follow-up in 5 months call if you need me sooner.  Congrats on improved cholesterol do take the dose of medication you have.  Congrats on changing eating habits with weight loss.  You are referred to cardiology because of reported poor exercise tolerance with palpitations.  You are referred for colonoscopy which is due in December.  Please contact me when you are ready for referral for knee surgery as well as surgery on your right bunion both are recommended.  Fasting lipid CMP and EGFR CBC TSH and vitamin D level 3 to 5 days before your follow-up visit.  Thanks for choosing [facility], we consider it a privelige to serve you.  

## 2022-04-06 NOTE — Progress Notes (Signed)
    Karen [name]     MRN: [id number]      DOB: 02/14/1946  HPI: Patient is in for annual physical exam. 3 month h/o poor exercise tolerance , states gets tired easily as well as palpitations C/o painful bunion on right foot , needs surgery but putting this off, left knee is stiff , painful and unstable, plans to have surgery on thios in the near future is aware of high fall risk Recent labs,  are reviewed.and a re good Immunization is reviewed , and  updated if needed.   PE: BP 126/71 (BP Location: Right Arm, Patient Position: Sitting, Cuff Size: Large)   Pulse 80   Ht '5\' 6"'$  (1.676 m)   Wt 206 lb 12.8 oz (93.8 kg)   SpO2 98%   BMI 33.38 kg/m   Pleasant  female, alert and oriented x 3, in no cardio-pulmonary distress. Afebrile. HEENT No facial trauma or asymetry. Sinuses non tender.  Extra occullar muscles intact.. External ears normal, . Neck: supple, no adenopathy,JVD or thyromegaly.No bruits.  Chest: Clear to ascultation bilaterally.No crackles or wheezes. Non tender to palpation    Cardiovascular system; Heart sounds normal,  S1 and  S2 ,no S3.  No murmur, or thrill. Apical beat not displaced Peripheral pulses normal.  Abdomen: Soft, non tender, no organomegaly or masses. No bruits. Bowel sounds normal. No guarding, tenderness or rebound.      Musculoskeletal exam: Decreased though adequate  ROM of spine,normal in  hips , shoulders and  reduced in both knees. deformity ,swelling and  crepitus noted in knees No muscle wasting or atrophy. Large bunion on right great toe  Neurologic: Cranial nerves 2 to 12 intact. Power, tone ,sensation and reflexes normal throughout. No disturbance in gait. No tremor.  Skin: Intact, no ulceration, erythema , scaling or rash noted. Pigmentation normal throughout  Psych; Normal mood and affect. Judgement and concentration normal   Assessment & Plan:  Annual visit for general adult medical examination with abnormal  findings Annual exam as documented. Counseling done  re healthy lifestyle involving commitment to 150 minutes exercise per week, heart healthy diet, and attaining healthy weight.The importance of adequate sleep also discussed. Changes in health habits are decided on by the patient with goals and time frames  set for achieving them. Immunization and cancer screening needs are specifically addressed at this visit.   Fatigue due to excessive exertion notes poor exercise tolerance with exertional fatigue in past 2 to 3 months, will have cardiology eval  Palpitations Reports intermittent palpitstions mainly with activity in past 2 to 3 months, cardiology to eval

## 2022-04-28 ENCOUNTER — Encounter: Payer: Self-pay | Admitting: Internal Medicine

## 2022-04-28 ENCOUNTER — Ambulatory Visit: Payer: Medicare Other | Attending: Internal Medicine | Admitting: Internal Medicine

## 2022-04-28 ENCOUNTER — Telehealth: Payer: Self-pay | Admitting: Internal Medicine

## 2022-04-28 ENCOUNTER — Other Ambulatory Visit: Payer: Self-pay | Admitting: Internal Medicine

## 2022-04-28 ENCOUNTER — Ambulatory Visit: Payer: Medicare Other | Attending: Internal Medicine

## 2022-04-28 ENCOUNTER — Encounter: Payer: Self-pay | Admitting: *Deleted

## 2022-04-28 VITALS — BP 146/74 | HR 72 | Ht 66.0 in | Wt 202.2 lb

## 2022-04-28 DIAGNOSIS — I10 Essential (primary) hypertension: Secondary | ICD-10-CM | POA: Diagnosis not present

## 2022-04-28 DIAGNOSIS — R002 Palpitations: Secondary | ICD-10-CM | POA: Diagnosis not present

## 2022-04-28 NOTE — Patient Instructions (Signed)
Medication Instructions:  Your physician recommends that you continue on your current medications as directed. Please refer to the Current Medication list given to you today.   Labwork: none  Testing/Procedures: ZIO- Long Term Monitor Instructions   Your physician has requested you wear your ZIO patch monitor 14 days.   This is a single patch monitor.  [employer] supplies one patch monitor per enrollment.  Additional stickers are not available.      Do not shower for the first 24 hours.  You may shower after the first 24 hours.   Press button if you feel a symptom. You will hear a small click.  Record Date, Time and Symptom in the Patient Log Book.   When you are ready to remove patch, follow instructions on last 2 pages of Patient Log Book.  Stick patch monitor onto last page of Patient Log Book.   Place Patient Log Book in [place] box.  Use locking tab on box and tape box closed securely.  The [employer] and [employer] has [employer] on it.  Please place in mailbox as soon as possible.  Your physician should have your test results approximately 7 days after the monitor has been mailed back to [facility].   Call [place] at [phone] if you have questions regarding your ZIO XT patch monitor.  Call them immediately if you see an orange light blinking on your monitor.   If your monitor falls off in less than 4 days contact our Monitor department at [phone].  If your monitor becomes loose or falls off after 4 days call [employer] at [phone] for suggestions on securing your monitor.   Follow-Up:  Your physician recommends that you schedule a follow-up appointment in: As Needed  Any Other Special Instructions Will Be Listed Below (If Applicable).  If you need a refill on your cardiac medications before your next appointment, please call your pharmacy.

## 2022-04-28 NOTE — Progress Notes (Signed)
Cardiology Office Note  Date: 04/28/2022   ID: Karen [name], DOB 1946/06/11, MRN [id number]  PCP:  Fayrene Helper, MD  Cardiologist:  None Electrophysiologist:  None   Reason for Office Visit: Evaluation of palpitations at the request of Dr. Moshe Cipro   History of Present Illness: Karen [name] is a 76 y.o. female known to have HTN, HLD, prediabetes was referred to cardiology clinic for evaluation of palpitations at the request of Dr. Moshe Cipro.  Patient reported having frequent palpitations in the past however after losing weight she is currently having once a week palpitations lasting for minutes. At the time she takes deep breaths and lays on her back but the palpitations resolved.  She also reported fatigue since COVID infection in 2020 however after losing weight, her fatigue also improved.  She has chronic leg pain and back pain and is currently working with her PCP to get her knees fixed soon.  She does not check her blood pressure every day at home but during her PCP's visit, her blood pressures are controlled.  Denies smoking cigarettes, alcohol use or illicit drug abuse.  No family history of premature ASCVD. no sleep apnea symptoms.  Past Medical History:  Diagnosis Date   Arthritis    C. difficile colitis Jul 18, 2013   Vancomycin. Recheck Cdiff positive. Vancomycin continued for an additional 2 weeks   H/O herpes zoster    Hyperlipidemia    Hypertension    Metabolic syndrome X    Pes anserinus tendinitis 11/22/2011   Prediabetes 2014   lifestyle    Prediabetes     Past Surgical History:  Procedure Laterality Date   ABDOMINAL HYSTERECTOMY  1983 approx   fibroids, partial   ANKLE SURGERY     COLONOSCOPY  04/27/2005   [id number] polyp at the splenic flexure cold biopsied/removed. Mid  descending polyp removed with cold snare technique. The remainder of the of the colon looked normal/ Normal rectum. inflammed adenomatous polyps.   COLONOSCOPY N/A  05/26/2013   Dr. Rourk:Multiple colonic polyps removed, tubular adenomas. Needs surveillance Dec 2017.    COLONOSCOPY N/A 05/24/2017   Procedure: COLONOSCOPY;  Surgeon: Daneil Dolin, MD;  [facility];  Service: Endoscopy;  Laterality: N/A;  1:00 pm   FRACTURE SURGERY Right mid 1990's   pin put in and waas removed    Current Outpatient Medications  Medication Sig Dispense Refill   Coenzyme Q10 (CO Q 10 PO) Take 1 tablet by mouth daily.      levocetirizine (XYZAL) 5 MG tablet Take 5 mg by mouth every evening.     lisinopril-hydrochlorothiazide (ZESTORETIC) 20-25 MG tablet Take 1 tablet by mouth once daily 90 tablet 0   meclizine (ANTIVERT) 12.5 MG tablet Take 12.5 mg by mouth 3 (three) times daily as needed for dizziness.     simvastatin (ZOCOR) 20 MG tablet Take 1 tablet (20 mg total) by mouth at bedtime. 30 tablet 4   No current facility-administered medications for this visit.   Allergies:  Amlodipine, Triamterene, Other, and Penicillins   Social History: The patient  reports that she has never smoked. She has never used smokeless tobacco. She reports that she does not drink alcohol and does not use drugs.   Family History: The patient's family history includes Diabetes in her sister; Hypertension in her brother, father, mother, sister, sister, son, and son; Lung cancer in her mother.   ROS:  Please see the history of present illness. Otherwise, complete review  of systems is positive for none.  All other systems are reviewed and negative.   Physical Exam: VS:  BP (!) 146/74   Pulse 72   Ht '5\' 6"'$  (1.676 m)   Wt 202 lb 3.2 oz (91.7 kg)   SpO2 96%   BMI 32.64 kg/m , BMI Body mass index is 32.64 kg/m.  Wt Readings from Last 3 Encounters:  04/28/22 202 lb 3.2 oz (91.7 kg)  04/06/22 206 lb 12.8 oz (93.8 kg)  11/09/21 215 lb 1.3 oz (97.6 kg)    General: Patient appears comfortable at rest. HEENT: Conjunctiva and lids normal, oropharynx clear with moist  mucosa. Neck: Supple, no elevated JVP or carotid bruits, no thyromegaly. Lungs: Clear to auscultation, nonlabored breathing at rest. Cardiac: Regular rate and rhythm, no S3 or significant systolic murmur, no pericardial rub. Abdomen: Soft, nontender, no hepatomegaly, bowel sounds present, no guarding or rebound. Extremities: No pitting edema, distal pulses 2+. Skin: Warm and dry. Musculoskeletal: No kyphosis. Neuropsychiatric: Alert and oriented x3, affect grossly appropriate.  Recent Labwork: 11/03/2021: Hemoglobin 12.6; Platelets 330; TSH 0.905 03/31/2022: ALT 22; AST 21; BUN 17; Creatinine, Ser 0.89; Potassium 4.3; Sodium 140     Component Value Date/Time   CHOL 196 03/31/2022 0908   TRIG 79 03/31/2022 0908   HDL 56 03/31/2022 0908   CHOLHDL 3.5 03/31/2022 0908   CHOLHDL 3.4 11/05/2019 1022   VLDL 14 02/09/2017 1202   LDLCALC 126 (H) 03/31/2022 0908   LDLCALC 105 (H) 11/05/2019 1022    Other Studies Reviewed Today:   Assessment and Plan: Patient is a 76 year old F known to have HTN, HLD was referred to cardiology clinic for evaluation of palpitations at the request of Dr. Moshe Cipro.  #Palpitations -I do not suspect any malignant etiology of palpitations and think this is of benign nature.  We will obtain 2-week event monitor.  #HTN, controlled -Continue lisinopril-HCTZ 20-25 mg once daily  #HLD, not at goal (goal LDL less than 100) -Continue simvastatin 20 mg nightly.  Patient is working on her diet and exercise regimen to get the LDL under control.  Follow-up with her PCP for lipid management.  I have spent a total of 45 minutes with patient reviewing chart, EKGs, labs and examining patient as well as establishing an assessment and plan that was discussed with the patient.  > 50% of time was spent in direct patient care.      Medication Adjustments/Labs and Tests Ordered: Current medicines are reviewed at length with the patient today.  Concerns regarding medicines are  outlined above.   Tests Ordered: Orders Placed This Encounter  Procedures   EKG 12-Lead    Medication Changes: No orders of the defined types were placed in this encounter.   Disposition:  Follow up  PRN  Signed Vangie Bicker, MD, 04/28/2022 8:38 AM    [facility] [place], [place], [place] [address]

## 2022-05-01 NOTE — Telephone Encounter (Signed)
Error

## 2022-05-11 ENCOUNTER — Other Ambulatory Visit: Payer: Self-pay | Admitting: Family Medicine

## 2022-05-17 ENCOUNTER — Encounter: Payer: Medicare Other | Admitting: Family Medicine

## 2022-05-17 DIAGNOSIS — R002 Palpitations: Secondary | ICD-10-CM | POA: Diagnosis not present

## 2022-05-18 ENCOUNTER — Encounter: Payer: Self-pay | Admitting: *Deleted

## 2022-05-18 NOTE — Patient Instructions (Signed)
  Procedure: colonoscopy  Estimated body mass index is 32.6 kg/m as calculated from the following:   Height as of this encounter: '5\' 6"'$  (1.676 m).   Weight as of this encounter: 202 lb (91.6 kg).   Have you had a colonoscopy before?  05/24/17, Dr. Gala Romney  Do you have family history of colon cancer?  no  Do you have a family history of polyps? no  Previous colonoscopy with polyps removed? yes  Do you have a history colorectal cancer?   no  Are you diabetic?  no  Do you have a prosthetic or mechanical heart valve? no  Do you have a pacemaker/defibrillator?   no  Have you had endocarditis/atrial fibrillation?  no  Do you use supplemental oxygen/CPAP?  no  Have you had joint replacement within the last 12 months?  no  Do you tend to be constipated or have to use laxatives?  no   Do you have history of alcohol use? If yes, how much and how often.  no  Do you have history or are you using drugs? If yes, what do are you  using?  no  Have you ever had a stroke/heart attack?  no  Have you ever had a heart or other vascular stent placed,?no  Do you take weight loss medication? no  female patients,: have you had a hysterectomy? yes                              are you post menopausal?  no                              do you still have your menstrual cycle? no    Date of last menstrual period? 1985  Do you take any blood-thinning medications such as: (Plavix, aspirin, Coumadin, Aggrenox, Brilinta, Xarelto, Eliquis, Pradaxa, Savaysa or Effient)? no  If yes we need the name, milligram, dosage and who is prescribing doctor:               Current Outpatient Medications  Medication Sig Dispense Refill   Coenzyme Q10 (CO Q 10 PO) Take 1 tablet by mouth daily.      Ergocalciferol (VITAMIN D2) 50 MCG (2000 UT) TABS Take by mouth daily.     levocetirizine (XYZAL) 5 MG tablet Take 5 mg by mouth every evening.     lisinopril-hydrochlorothiazide (ZESTORETIC) 20-25 MG tablet Take 1  tablet by mouth once daily 90 tablet 0   simvastatin (ZOCOR) 20 MG tablet Take 1 tablet (20 mg total) by mouth at bedtime. 30 tablet 4   No current facility-administered medications for this visit.    Allergies  Allergen Reactions   Amlodipine Rash   Triamterene Other (See Comments)    Leg cramps, and nausea, also max dose had no effect on BP   Other     NO BLOOD PRODUCTS   Penicillins Rash    Has patient had a PCN reaction causing immediate rash, facial/tongue/throat swelling, SOB or lightheadedness with hypotension: Yes Has patient had a PCN reaction causing severe rash involving mucus membranes or skin necrosis: No Has patient had a PCN reaction that required hospitalization: Unknown Has patient had a PCN reaction occurring within the last 10 years: No If all of the above answers are "NO", then may proceed with Cephalosporin use.

## 2022-05-19 DIAGNOSIS — R002 Palpitations: Secondary | ICD-10-CM | POA: Diagnosis not present

## 2022-05-25 ENCOUNTER — Encounter: Payer: Medicare Other | Admitting: Family Medicine

## 2022-06-16 NOTE — Progress Notes (Signed)
ASA 2. Ok to schedule. Needs BMET pre-op.

## 2022-06-20 NOTE — Progress Notes (Signed)
Called pt. Requested Feb AM with Dr. Gala Romney. Advised will call once we get that schedule

## 2022-06-29 ENCOUNTER — Other Ambulatory Visit: Payer: Self-pay | Admitting: Family Medicine

## 2022-06-29 ENCOUNTER — Encounter (INDEPENDENT_AMBULATORY_CARE_PROVIDER_SITE_OTHER): Payer: Self-pay | Admitting: *Deleted

## 2022-06-29 DIAGNOSIS — Z86010 Personal history of colonic polyps: Secondary | ICD-10-CM

## 2022-06-29 MED ORDER — PEG 3350-KCL-NA BICARB-NACL 420 G PO SOLR: 4000.0000 mL | Freq: Once | ORAL | 0 refills | Status: AC

## 2022-06-29 NOTE — Progress Notes (Unsigned)
Spoke with pt. Scheduled for 2/22 at 10:15am. Aware will send instructions. Rx for prep to be sent to pharmacy. Also aware needs lab work prior.

## 2022-06-29 NOTE — Progress Notes (Unsigned)
PA done via [facility].   Authorization #:[id number] DOS: Aug 03, 2022 - Nov 01, 2022

## 2022-06-30 NOTE — Progress Notes (Signed)
Referral completed

## 2022-07-04 ENCOUNTER — Telehealth: Payer: Self-pay | Admitting: *Deleted

## 2022-07-04 NOTE — Telephone Encounter (Signed)
Spoke with pt. Aware Dr. Gala Romney will not be available on 2/22 and we rescheduled her to 2/21 at 9:45am. Aware will send new instructions to mychart. Message sent to endo making aware  PA done and approved via [facility] Auth# [id number], DOS: Jul 04, 2022 - Oct 02, 2022

## 2022-07-24 ENCOUNTER — Other Ambulatory Visit (HOSPITAL_COMMUNITY)
Admission: RE | Admit: 2022-07-24 | Discharge: 2022-07-24 | Disposition: A | Discharge status: Home / Self Care | Payer: Medicare Other | Source: Ambulatory Visit | Attending: Internal Medicine | Admitting: Internal Medicine

## 2022-07-24 DIAGNOSIS — Z86010 Personal history of colonic polyps: Secondary | ICD-10-CM | POA: Insufficient documentation

## 2022-07-24 LAB — BASIC METABOLIC PANEL
Anion gap: 7 (ref 5–15)
BUN: 19 mg/dL (ref 8–23)
CO2: 27 mmol/L (ref 22–32)
Calcium: 9.8 mg/dL (ref 8.9–10.3)
Chloride: 102 mmol/L (ref 98–111)
Creatinine, Ser: 0.82 mg/dL (ref 0.44–1.00)
GFR, Estimated: >60 mL/min (ref >60)
Glucose, Bld: 91 mg/dL (ref 70–99)
Potassium: 3.8 mmol/L (ref 3.5–5.1)
Sodium: 136 mmol/L (ref 135–145)

## 2022-07-31 ENCOUNTER — Encounter (HOSPITAL_COMMUNITY)
Admission: RE | Admit: 2022-07-31 | Discharge: 2022-07-31 | Disposition: A | Discharge status: Home / Self Care | Payer: Medicare Other | Source: Ambulatory Visit | Attending: Internal Medicine | Admitting: Internal Medicine

## 2022-07-31 ENCOUNTER — Encounter (HOSPITAL_COMMUNITY): Payer: Self-pay

## 2022-08-02 ENCOUNTER — Ambulatory Visit (HOSPITAL_COMMUNITY)
Admission: RE | Admit: 2022-08-02 | Discharge: 2022-08-02 | Disposition: A | Discharge status: Home / Self Care | Payer: Medicare Other | Attending: Internal Medicine | Admitting: Internal Medicine

## 2022-08-02 ENCOUNTER — Encounter (HOSPITAL_COMMUNITY)
Admission: RE | Disposition: A | Discharge status: Home / Self Care | Payer: Self-pay | Source: Home / Self Care | Attending: Internal Medicine

## 2022-08-02 ENCOUNTER — Ambulatory Visit (HOSPITAL_COMMUNITY): Payer: Medicare Other | Admitting: Anesthesiology

## 2022-08-02 ENCOUNTER — Ambulatory Visit (HOSPITAL_BASED_OUTPATIENT_CLINIC_OR_DEPARTMENT_OTHER): Payer: Medicare Other | Admitting: Anesthesiology

## 2022-08-02 ENCOUNTER — Encounter (HOSPITAL_COMMUNITY): Payer: Self-pay | Admitting: Internal Medicine

## 2022-08-02 DIAGNOSIS — K5730 Diverticulosis of large intestine without perforation or abscess without bleeding: Secondary | ICD-10-CM | POA: Diagnosis not present

## 2022-08-02 DIAGNOSIS — Z79899 Other long term (current) drug therapy: Secondary | ICD-10-CM | POA: Diagnosis not present

## 2022-08-02 DIAGNOSIS — K640 First degree hemorrhoids: Secondary | ICD-10-CM

## 2022-08-02 DIAGNOSIS — Z1211 Encounter for screening for malignant neoplasm of colon: Secondary | ICD-10-CM

## 2022-08-02 DIAGNOSIS — D123 Benign neoplasm of transverse colon: Secondary | ICD-10-CM | POA: Diagnosis not present

## 2022-08-02 DIAGNOSIS — F419 Anxiety disorder, unspecified: Secondary | ICD-10-CM | POA: Insufficient documentation

## 2022-08-02 DIAGNOSIS — E785 Hyperlipidemia, unspecified: Secondary | ICD-10-CM | POA: Diagnosis not present

## 2022-08-02 DIAGNOSIS — M1990 Unspecified osteoarthritis, unspecified site: Secondary | ICD-10-CM | POA: Diagnosis not present

## 2022-08-02 DIAGNOSIS — M5410 Radiculopathy, site unspecified: Secondary | ICD-10-CM | POA: Diagnosis not present

## 2022-08-02 DIAGNOSIS — I10 Essential (primary) hypertension: Secondary | ICD-10-CM | POA: Insufficient documentation

## 2022-08-02 DIAGNOSIS — Z86010 Personal history of colonic polyps: Secondary | ICD-10-CM

## 2022-08-02 HISTORY — PX: COLONOSCOPY WITH PROPOFOL: SHX5780

## 2022-08-02 HISTORY — PX: POLYPECTOMY: SHX5525

## 2022-08-02 SURGERY — COLONOSCOPY WITH PROPOFOL
Anesthesia: General

## 2022-08-02 MED ORDER — PROPOFOL 10 MG/ML IV BOLUS
INTRAVENOUS | Status: DC | PRN
  Administered 2022-08-02: 100 mg via INTRAVENOUS

## 2022-08-02 MED ORDER — LIDOCAINE HCL (CARDIAC) PF 100 MG/5ML IV SOSY
PREFILLED_SYRINGE | INTRAVENOUS | Status: DC | PRN
  Administered 2022-08-02: 50 mg via INTRAVENOUS

## 2022-08-02 MED ORDER — LACTATED RINGERS IV SOLN: INTRAVENOUS | Status: DC

## 2022-08-02 MED ORDER — PROPOFOL 500 MG/50ML IV EMUL
INTRAVENOUS | Status: DC | PRN
  Administered 2022-08-02: 100 ug/kg/min via INTRAVENOUS

## 2022-08-02 NOTE — Anesthesia Postprocedure Evaluation (Signed)
Anesthesia Post Note  Patient: Karen [name]  Procedure(s) Performed: COLONOSCOPY WITH PROPOFOL POLYPECTOMY  Patient location during evaluation: Phase II Anesthesia Type: General Level of consciousness: awake and alert and oriented Pain management: pain level controlled Vital Signs Assessment: post-procedure vital signs reviewed and stable Respiratory status: spontaneous breathing, nonlabored ventilation and respiratory function stable Cardiovascular status: blood pressure returned to baseline and stable Postop Assessment: no apparent nausea or vomiting Anesthetic complications: no  No notable events documented.   Last Vitals:  Vitals:   08/02/22 0848 08/02/22 1000  BP: 137/62 (!) 105/45  Pulse: 78 73  Resp: 13 17  Temp: 36.9 C 36.8 C  SpO2: 99% 100%    Last Pain:  Vitals:   08/02/22 1000  TempSrc: Oral  PainSc: 0-No pain                 Stan Cantave C Gaven Eugene

## 2022-08-02 NOTE — H&P (Signed)
$@LOGOp$ @   Primary Care Physician:  Fayrene Helper, MD Primary Gastroenterologist:  Dr. Gala Romney  Pre-Procedure History & Physical: HPI:  Karen [name] is a 77 y.o. female here for  surveillance colonoscopy.  History of multiple colonic adenomas removed over multiple colonoscopies.  No bowel symptoms at this time.  Past Medical History:  Diagnosis Date   Arthritis    C. difficile colitis Jul 18, 2013   Vancomycin. Recheck Cdiff positive. Vancomycin continued for an additional 2 weeks   H/O herpes zoster    Hyperlipidemia    Hypertension    Metabolic syndrome X    Pes anserinus tendinitis 11/22/2011   Prediabetes 2014   lifestyle    Prediabetes     Past Surgical History:  Procedure Laterality Date   ABDOMINAL HYSTERECTOMY  1983 approx   fibroids, partial   ANKLE SURGERY     COLONOSCOPY  04/27/2005   [id number] polyp at the splenic flexure cold biopsied/removed. Mid  descending polyp removed with cold snare technique. The remainder of the of the colon looked normal/ Normal rectum. inflammed adenomatous polyps.   COLONOSCOPY N/A 05/26/2013   Dr. Najai Waszak:Multiple colonic polyps removed, tubular adenomas. Needs surveillance Dec 2017.    COLONOSCOPY N/A 05/24/2017   Procedure: COLONOSCOPY;  Surgeon: Daneil Dolin, MD;  [facility];  Service: Endoscopy;  Laterality: N/A;  1:00 pm   FRACTURE SURGERY Right mid 1990's   pin put in and waas removed    Prior to Admission medications   Medication Sig Start Date End Date Taking? Authorizing Provider  acetaminophen (TYLENOL) 500 MG tablet Take 1,000 mg by mouth every 6 (six) hours as needed for moderate pain.   Yes [provider]  Carboxymethylcellul-Glycerin (LUBRICATING EYE DROPS OP) Place 1 drop into both eyes daily as needed (dry eyes).   Yes [provider]  Coenzyme Q10 (CO Q 10 PO) Take 1 tablet by mouth daily.    Yes [provider]  diclofenac Sodium (VOLTAREN) 1 % GEL Apply 1  Application topically 4 (four) times daily as needed (pain).   Yes [provider]  Ergocalciferol (VITAMIN D2) 50 MCG (2000 UT) TABS Take 2,000 Units by mouth daily.   Yes [provider]  Homeopathic Products [facility] RELIEF EX) Apply 1 Application topically daily as needed (pain).   Yes [provider]  levocetirizine (XYZAL) 5 MG tablet Take 5 mg by mouth every evening.   Yes [provider]  lisinopril-hydrochlorothiazide (ZESTORETIC) 20-25 MG tablet Take 1 tablet by mouth once daily 05/11/22  Yes Fayrene Helper, MD  simvastatin (ZOCOR) 20 MG tablet TAKE 1 TABLET BY MOUTH AT BEDTIME 06/29/22  Yes Fayrene Helper, MD    Allergies as of 06/29/2022 - Review Complete 04/28/2022  Allergen Reaction Noted   Amlodipine Rash 05/02/2017   Triamterene Other (See Comments) 02/28/2018   Other  05/24/2017   Penicillins Rash 03/22/2010    Family History  Problem Relation Age of Onset   Hypertension Mother    Lung cancer Mother    Hypertension Father    Hypertension Sister    Hypertension Brother    Hypertension Sister    Diabetes Sister    Hypertension Son    Hypertension Son    Colon cancer Neg Hx     Social History   Socioeconomic History   Marital status: Married    Spouse name: Not on file   Number of children: 3   Years of education: 17  Highest education level: 12th grade  Occupational History   Occupation: fulltime - caregiver   Tobacco Use   Smoking status: Never   Smokeless tobacco: Never  Vaping Use   Vaping Use: Never used  Substance and Sexual Activity   Alcohol use: No   Drug use: No   Sexual activity: Not Currently    Birth control/protection: Surgical  Other Topics Concern   Not on file  Social History Narrative   Lives with husband alone, marriage is strained    Social Determinants of Health   Financial Resource Strain: Low Risk  (05/12/2021)   Overall Financial Resource Strain (CARDIA)    Difficulty of  Paying Living Expenses: Not very hard  Food Insecurity: No Food Insecurity (05/12/2021)   Hunger Vital Sign    Worried About Running Out of Food in the Last Year: Never true    [place] in the Last Year: Never true  Transportation Needs: Unknown (05/12/2021)   PRAPARE - Hydrologist (Medical): No    Lack of Transportation (Non-Medical): Not on file  Physical Activity: Insufficiently Active (04/27/2020)   Exercise Vital Sign    Days of Exercise per Week: 3 days    Minutes of Exercise per Session: 30 min  Stress: No Stress Concern Present (05/12/2021)   [place]    Feeling of Stress : Not at all  Social Connections: [place] (05/12/2021)   Social Connection and Isolation Panel [NHANES]    Frequency of Communication with Friends and Family: More than three times a week    Frequency of Social Gatherings with Friends and Family: More than three times a week    Attends Religious Services: More than 4 times per year    Active Member of [employer] or Organizations: Yes    Attends Music therapist: More than 4 times per year    Marital Status: Married  Human resources officer Violence: Not At Risk (04/27/2020)   Humiliation, Afraid, Rape, and Kick questionnaire    Fear of Current or Ex-Partner: No    Emotionally Abused: No    Physically Abused: No    Sexually Abused: No    Review of Systems: See HPI, otherwise negative ROS  Physical Exam: BP 137/62   Pulse 78   Temp 98.5 F (36.9 C) (Oral)   Resp 13   Ht 5' 5"$  (1.651 m)   Wt 90.7 kg   SpO2 99%   BMI 33.28 kg/m  General:   Alert,  Well-developed, well-nourished, pleasant and cooperative in NAD Neck:  Supple; no masses or thyromegaly. No significant cervical adenopathy. Lungs:  Clear throughout to auscultation.   No wheezes, crackles, or rhonchi. No acute distress. Heart:  Regular rate and rhythm; no murmurs, clicks,  rubs,  or gallops. Abdomen: Non-distended, normal bowel sounds.  Soft and nontender without appreciable mass or hepatosplenomegaly.   Impression/Plan:    77 year old lady with history of multiple colonic adenomas removed over time.  She is here for surveillance colonoscopy. The risks, benefits, limitations, alternatives and imponderables have been reviewed with the patient. Questions have been answered. All parties are agreeable.       Notice: This dictation was prepared with Dragon dictation along with smaller phrase technology. Any transcriptional errors that result from this process are unintentional and may not be corrected upon review.

## 2022-08-02 NOTE — Discharge Instructions (Signed)
  Colonoscopy Discharge Instructions  Read the instructions outlined below and refer to this sheet in the next few weeks. These discharge instructions provide you with general information on caring for yourself after you leave the hospital. Your doctor may also give you specific instructions. While your treatment has been planned according to the most current medical practices available, unavoidable complications occasionally occur. If you have any problems or questions after discharge, call Dr. Gala Romney at [phone]. ACTIVITY You may resume your regular activity, but move at a slower pace for the next 24 hours.  Take frequent rest periods for the next 24 hours.  Walking will help get rid of the air and reduce the bloated feeling in your belly (abdomen).  No driving for 24 hours (because of the medicine (anesthesia) used during the test).   Do not sign any important legal documents or operate any machinery for 24 hours (because of the anesthesia used during the test).  NUTRITION Drink plenty of fluids.  You may resume your normal diet as instructed by your doctor.  Begin with a light meal and progress to your normal diet. Heavy or fried foods are harder to digest and may make you feel sick to your stomach (nauseated).  Avoid alcoholic beverages for 24 hours or as instructed.  MEDICATIONS You may resume your normal medications unless your doctor tells you otherwise.  WHAT YOU CAN EXPECT TODAY Some feelings of bloating in the abdomen.  Passage of more gas than usual.  Spotting of blood in your stool or on the toilet paper.  IF YOU HAD POLYPS REMOVED DURING THE COLONOSCOPY: No aspirin products for 7 days or as instructed.  No alcohol for 7 days or as instructed.  Eat a soft diet for the next 24 hours.  FINDING OUT THE RESULTS OF YOUR TEST Not all test results are available during your visit. If your test results are not back during the visit, make an appointment with your caregiver to find out the  results. Do not assume everything is normal if you have not heard from your caregiver or the medical facility. It is important for you to follow up on all of your test results.  SEEK IMMEDIATE MEDICAL ATTENTION IF: You have more than a spotting of blood in your stool.  Your belly is swollen (abdominal distention).  You are nauseated or vomiting.  You have a temperature over 101.  You have abdominal pain or discomfort that is severe or gets worse throughout the day.    2 polyps removed from your colon today  Colon polyp and diverticulosis information provided   further recommendations to follow pending review of pathology report  At patient request, I called Jule Ser at [phone] findings and recommendations

## 2022-08-02 NOTE — Op Note (Signed)
[facility] Patient Name: Karen [name] Procedure Date: 08/02/2022 9:27 AM MRN: [id number] Date of Birth: 08/02/1945 Attending MD: Norvel Richards , MD, [id number] CSN: [id number] Age: 77 Admit Type: Outpatient Procedure:                Colonoscopy Indications:              Screening for colorectal malignant neoplasm Providers:                Norvel Richards, MD, Lambert Mody,                            Raphael Gibney, Technician Referring MD:              Medicines:                Propofol per Anesthesia Complications:            No immediate complications. Estimated Blood Loss:     Estimated blood loss was minimal. Estimated blood                            loss was minimal. Procedure:                Pre-Anesthesia Assessment:                           - Prior to the procedure, a History and Physical                            was performed, and patient medications and                            allergies were reviewed. The patient's tolerance of                            previous anesthesia was also reviewed. The risks                            and benefits of the procedure and the sedation                            options and risks were discussed with the patient.                            All questions were answered, and informed consent                            was obtained. Prior Anticoagulants: The patient has                            taken no anticoagulant or antiplatelet agents. ASA                            Grade Assessment: III - A patient with severe  systemic disease. After reviewing the risks and                            benefits, the patient was deemed in satisfactory                            condition to undergo the procedure.                           After obtaining informed consent, the colonoscope                            was passed under direct vision. Throughout the                             procedure, the patient's blood pressure, pulse, and                            oxygen saturations were monitored continuously. The                            [id number] scope was introduced through the                            anus and advanced to the the cecum, identified by                            appendiceal orifice and ileocecal valve. The                            colonoscopy was performed without difficulty. The                            patient tolerated the procedure well. The quality                            of the bowel preparation was excellent. The                            ileocecal valve, appendiceal orifice, and rectum                            were photographed. The colonoscopy was performed                            without difficulty. The patient tolerated the                            procedure well. Scope In: 9:40:56 AM Scope Out: 9:54:23 AM Scope Withdrawal Time: 0 hours 9 minutes 9 seconds  Total Procedure Duration: 0 hours 13 minutes 27 seconds  Findings:      The perianal and digital rectal examinations were normal.      Non-bleeding internal hemorrhoids were found during retroflexion. The       hemorrhoids  were moderate, medium-sized and Grade I (internal       hemorrhoids that do not prolapse).      Scattered medium-mouthed diverticula were found in the entire colon.      Two sessile polyps were found in the splenic flexure. The polyps were 5       to 6 mm in size.      The exam was otherwise without abnormality on direct and retroflexion       views. Impression:               - Non-bleeding internal hemorrhoids.                           - Diverticulosis in the entire examined colon.                           - Two 5 to 6 mm polyps at the splenic flexure.                           - The examination was otherwise normal on direct                            and retroflexion views.                           - No specimens  collected. Moderate Sedation:      Moderate (conscious) sedation was personally administered by an       anesthesia professional. The following parameters were monitored: oxygen       saturation, heart rate, blood pressure, respiratory rate, EKG, adequacy       of pulmonary ventilation, and response to care. Recommendation:           - Patient has a contact number available for                            emergencies. The signs and symptoms of potential                            delayed complications were discussed with the                            patient. Return to normal activities tomorrow.                            Written discharge instructions were provided to the                            patient.                           - Advance diet as tolerated.                           - Continue present medications.                           - Repeat colonoscopy date to be determined after  pending pathology results are reviewed for                            surveillance.                           - Return to GI office (date not yet determined). Procedure Code(s):        --- Professional ---                           [phone], Colonoscopy, flexible; diagnostic, including                            collection of specimen(s) by brushing or washing,                            when performed (separate procedure) Diagnosis Code(s):        --- Professional ---                           Z12.11, Encounter for screening for malignant                            neoplasm of colon                           K64.0, First degree hemorrhoids                           D12.3, Benign neoplasm of transverse colon (hepatic                            flexure or splenic flexure)                           K57.30, Diverticulosis of large intestine without                            perforation or abscess without bleeding CPT copyright 2022 [employer]. All rights  reserved. The codes documented in this report are preliminary and upon coder review may  be revised to meet current compliance requirements. Cristopher Estimable. Sharnetta Gielow, MD Norvel Richards, MD 08/02/2022 10:09:23 AM This report has been signed electronically. Number of Addenda: 0

## 2022-08-02 NOTE — Anesthesia Preprocedure Evaluation (Signed)
Anesthesia Evaluation  Patient identified by MRN, date of birth, ID band Patient awake    Reviewed: Allergy & Precautions, H&P , NPO status , Patient's Chart, lab work & pertinent test results  Airway Mallampati: II  TM Distance: >3 FB Neck ROM: Full    Dental  (+) Dental Advisory Given Root canal:   Pulmonary neg pulmonary ROS   Pulmonary exam normal breath sounds clear to auscultation       Cardiovascular hypertension, Pt. on medications Normal cardiovascular exam Rhythm:Regular Rate:Normal     Neuro/Psych  PSYCHIATRIC DISORDERS Anxiety      Neuromuscular disease (Back pain with right-sided radiculopathy,Unsteady gait when walking)    GI/Hepatic negative GI ROS, Neg liver ROS,,,  Endo/Other  negative endocrine ROS    Renal/GU negative Renal ROS  negative genitourinary   Musculoskeletal  (+) Arthritis , Osteoarthritis,    Abdominal   Peds negative pediatric ROS (+)  Hematology negative hematology ROS (+)   Anesthesia Other Findings   Reproductive/Obstetrics negative OB ROS                             Anesthesia Physical Anesthesia Plan  ASA: 2  Anesthesia Plan: General   Post-op Pain Management: Minimal or no pain anticipated   Induction: Intravenous  PONV Risk Score and Plan: Propofol infusion  Airway Management Planned: Nasal Cannula and Natural Airway  Additional Equipment:   Intra-op Plan:   Post-operative Plan:   Informed Consent: I have reviewed the patients History and Physical, chart, labs and discussed the procedure including the risks, benefits and alternatives for the proposed anesthesia with the patient or authorized representative who has indicated his/her understanding and acceptance.       Plan Discussed with: CRNA and Surgeon  Anesthesia Plan Comments:        Anesthesia Quick Evaluation

## 2022-08-02 NOTE — Transfer of Care (Signed)
Immediate Anesthesia Transfer of Care Note  Patient: Karen [name]  Procedure(s) Performed: COLONOSCOPY WITH PROPOFOL POLYPECTOMY  Patient Location: Short Stay  Anesthesia Type:General  Level of Consciousness: awake  Airway & Oxygen Therapy: Patient Spontanous Breathing  Post-op Assessment: Report given to RN and Post -op Vital signs reviewed and stable  Post vital signs: Reviewed and stable  Last Vitals:  Vitals Value Taken Time  BP    Temp    Pulse    Resp    SpO2      Last Pain:  Vitals:   08/02/22 0936  TempSrc:   PainSc: 0-No pain         Complications: No notable events documented.

## 2022-08-03 LAB — SURGICAL PATHOLOGY

## 2022-08-08 ENCOUNTER — Encounter: Payer: Self-pay | Admitting: Internal Medicine

## 2022-08-08 ENCOUNTER — Encounter (HOSPITAL_COMMUNITY): Payer: Self-pay | Admitting: Internal Medicine

## 2022-08-10 ENCOUNTER — Encounter: Payer: Self-pay | Admitting: Radiology

## 2022-08-10 ENCOUNTER — Ambulatory Visit (INDEPENDENT_AMBULATORY_CARE_PROVIDER_SITE_OTHER): Payer: Medicare Other

## 2022-08-10 DIAGNOSIS — Z0000 Encounter for general adult medical examination without abnormal findings: Secondary | ICD-10-CM

## 2022-08-10 NOTE — Patient Instructions (Signed)
  Karen [name] , Thank you for taking time to come for your Medicare Wellness Visit. I appreciate your ongoing commitment to your health goals. Please review the following plan we discussed and let me know if I can assist you in the future.   These are the goals we discussed:  Goals      Exercise 3x per week (30 min per time)     Recommend you increase your exercise program at least 3 days a week for 30-45 minutes at a time as tolerated.       Patient Stated     Get surgery on torn meniscus to walk better     Patient Stated     Travel more     Weight (lb) < 200 lb (90.7 kg)        This is a list of the screening recommended for you and due dates:  Health Maintenance  Topic Date Due   DTaP/Tdap/Td vaccine (2 - Tdap) 04/27/2014   COVID-19 Vaccine (6 - 2023-24 season) 02/10/2022   Medicare Annual Wellness Visit  08/10/2023   Mammogram  12/31/2023   Colon Cancer Screening  08/02/2025   Pneumonia Vaccine  Completed   Flu Shot  Completed   DEXA scan (bone density measurement)  Completed   Hepatitis C Screening: USPSTF Recommendation to screen - Ages 104-79 yo.  Completed   Zoster (Shingles) Vaccine  Completed   HPV Vaccine  Aged Out

## 2022-08-10 NOTE — Progress Notes (Addendum)
Subjective:   Karen [name] is a 77 y.o. female who presents for Medicare Annual (Subsequent) preventive examination.  Review of Systems    I connected with  Karen [name] on 08/10/22 by a audio enabled telemedicine application and verified that I am speaking with the correct person using two identifiers.  Patient Location: Home  Provider Location: Office/Clinic  I discussed the limitations of evaluation and management by telemedicine. The patient expressed understanding and agreed to proceed.        Objective:    There were no vitals filed for this visit. There is no height or weight on file to calculate BMI.     07/31/2022   10:39 AM 10/18/2021    2:43 PM 05/12/2021    9:59 AM 03/04/2021    7:08 AM 04/27/2020    8:14 AM 04/22/2018    3:39 PM 05/24/2017    7:31 AM  Advanced Directives  Does Patient Have a Medical Advance Directive? No No No No No No No  Would patient like information on creating a medical advance directive? No - Patient declined No - Patient declined Yes (ED - Information included in AVS)  No - Patient declined Yes (ED - Information included in AVS) No - Patient declined    Current Medications (verified) Outpatient Encounter Medications as of 08/10/2022  Medication Sig   acetaminophen (TYLENOL) 500 MG tablet Take 1,000 mg by mouth every 6 (six) hours as needed for moderate pain.   Carboxymethylcellul-Glycerin (LUBRICATING EYE DROPS OP) Place 1 drop into both eyes daily as needed (dry eyes).   Coenzyme Q10 (CO Q 10 PO) Take 1 tablet by mouth daily.    diclofenac Sodium (VOLTAREN) 1 % GEL Apply 1 Application topically 4 (four) times daily as needed (pain).   Ergocalciferol (VITAMIN D2) 50 MCG (2000 UT) TABS Take 2,000 Units by mouth daily.   Homeopathic Products (THERAWORX RELIEF EX) Apply 1 Application topically daily as needed (pain).   levocetirizine (XYZAL) 5 MG tablet Take 5 mg by mouth every evening.   lisinopril-hydrochlorothiazide (ZESTORETIC)  20-25 MG tablet Take 1 tablet by mouth once daily   simvastatin (ZOCOR) 20 MG tablet TAKE 1 TABLET BY MOUTH AT BEDTIME   No facility-administered encounter medications on file as of 08/10/2022.    Allergies (verified) Amlodipine, Triamterene, Other, and Penicillins   History: Past Medical History:  Diagnosis Date   Arthritis    C. difficile colitis Jul 18, 2013   Vancomycin. Recheck Cdiff positive. Vancomycin continued for an additional 2 weeks   H/O herpes zoster    Hyperlipidemia    Hypertension    Metabolic syndrome X    Pes anserinus tendinitis 11/22/2011   Prediabetes 2014   lifestyle    Prediabetes    Past Surgical History:  Procedure Laterality Date   ABDOMINAL HYSTERECTOMY  1983 approx   fibroids, partial   ANKLE SURGERY     COLONOSCOPY  04/27/2005   [id number] polyp at the splenic flexure cold biopsied/removed. Mid  descending polyp removed with cold snare technique. The remainder of the of the colon looked normal/ Normal rectum. inflammed adenomatous polyps.   COLONOSCOPY N/A 05/26/2013   Dr. Rourk:Multiple colonic polyps removed, tubular adenomas. Needs surveillance Dec 2017.    COLONOSCOPY N/A 05/24/2017   Procedure: COLONOSCOPY;  Surgeon: Daneil Dolin, MD;  Location: [facility];  Service: Endoscopy;  Laterality: N/A;  1:00 pm   COLONOSCOPY WITH PROPOFOL N/A 08/02/2022   Procedure: COLONOSCOPY WITH PROPOFOL;  Surgeon: Gala Romney,  Cristopher Estimable, MD;  Location: [facility];  Service: Endoscopy;  Laterality: N/A;  1015am, asa 2   FRACTURE SURGERY Right mid 1990's   pin put in and waas removed   POLYPECTOMY  08/02/2022   Procedure: POLYPECTOMY;  Surgeon: Daneil Dolin, MD;  Location: [facility];  Service: Endoscopy;;   Family History  Problem Relation Age of Onset   Hypertension Mother    Lung cancer Mother    Hypertension Father    Hypertension Sister    Hypertension Brother    Hypertension Sister    Diabetes Sister    Hypertension Son    Hypertension  Son    Colon cancer Neg Hx    Social History   Socioeconomic History   Marital status: Married    Spouse name: Not on file   Number of children: 3   Years of education: 12   Highest education level: 12th grade  Occupational History   Occupation: fulltime - caregiver   Tobacco Use   Smoking status: Never   Smokeless tobacco: Never  Vaping Use   Vaping Use: Never used  Substance and Sexual Activity   Alcohol use: No   Drug use: No   Sexual activity: Not Currently    Birth control/protection: Surgical  Other Topics Concern   Not on file  Social History Narrative   Lives with husband alone, marriage is strained    Social Determinants of Health   Financial Resource Strain: Low Risk  (05/12/2021)   Overall Financial Resource Strain (CARDIA)    Difficulty of Paying Living Expenses: Not very hard  Food Insecurity: No Food Insecurity (05/12/2021)   Hunger Vital Sign    Worried About Running Out of Food in the Last Year: Never true    [place] in the Last Year: Never true  Transportation Needs: Unknown (05/12/2021)   PRAPARE - Hydrologist (Medical): No    Lack of Transportation (Non-Medical): Not on file  Physical Activity: Insufficiently Active (04/27/2020)   Exercise Vital Sign    Days of Exercise per Week: 3 days    Minutes of Exercise per Session: 30 min  Stress: No Stress Concern Present (05/12/2021)   [place]    Feeling of Stress : Not at all  Social Connections: [place] (05/12/2021)   Social Connection and Isolation Panel [NHANES]    Frequency of Communication with Friends and Family: More than three times a week    Frequency of Social Gatherings with Friends and Family: More than three times a week    Attends Religious Services: More than 4 times per year    Active Member of [employer] or Organizations: Yes    Attends Music therapist: More than 4  times per year    Marital Status: Married    Tobacco Counseling Counseling given: Not Answered   Clinical Intake:   Diabetic?NO   Activities of Daily Living     No data to display           Patient Care Team: Fayrene Helper, MD as PCP - General Rourk, Cristopher Estimable, MD as Consulting Physician (Gastroenterology)  Indicate any recent Medical Services you may have received from other than [facility] providers in the past year (date may be approximate).     Assessment:   This is a routine wellness examination for Karen [name].  Hearing/Vision screen No results found.  Dietary issues and exercise  activities discussed:     Goals Addressed   None   Depression Screen    04/06/2022    8:11 AM 11/09/2021    8:08 AM 08/02/2021    8:13 AM 05/12/2021   10:19 AM 03/01/2021    8:09 AM 04/27/2020    8:14 AM 04/27/2020    8:12 AM  PHQ 2/9 Scores  PHQ - 2 Score 0 0 0 0 0 0 0    Fall Risk    04/06/2022    8:11 AM 11/09/2021    8:08 AM 09/14/2021   10:52 AM 08/02/2021    8:12 AM 05/12/2021   10:21 AM  Fall Risk   Falls in the past year? 0 0 0 0 0  Number falls in past yr: 0 0 0 0 0  Injury with Fall? 0 0 0 0 0  Risk for fall due to : No Fall Risks No Fall Risks  No Fall Risks   Follow up Falls evaluation completed Falls evaluation completed  Falls evaluation completed     [place]:  Any stairs in or around the home? No  If so, are there any without handrails? No  Home free of loose throw rugs in walkways, pet beds, electrical cords, etc? Yes  Adequate lighting in your home to reduce risk of falls? Yes   ASSISTIVE DEVICES UTILIZED TO PREVENT FALLS:  Life alert? No  Use of a cane, walker or w/c? No  Grab bars in the bathroom? Yes  Shower chair or bench in shower? Yes  Elevated toilet seat or a handicapped toilet? No          04/27/2020    8:17 AM 04/24/2019    8:13 AM 04/22/2018    3:42 PM 04/11/2017    1:32 PM  6CIT Screen  What  Year? 0 points 0 points 0 points 0 points  What month? 0 points 0 points 0 points 0 points  What time? 0 points 0 points 3 points 0 points  Count back from 20 0 points 0 points 0 points 0 points  Months in reverse 0 points 0 points 0 points 0 points  Repeat phrase 0 points 0 points 0 points 0 points  Total Score 0 points 0 points 3 points 0 points    Immunizations Immunization History  Administered Date(s) Administered   Fluad Quad(high Dose 65+) 02/11/2019, 03/01/2021, 03/16/2022   H1N1 04/17/2008   Influenza Whole 03/29/2006, 07/30/2008, 03/26/2009   Influenza,inj,Quad PF,6+ Mos 02/20/2013, 03/23/2014, 04/07/2015, 03/21/2016, 02/13/2017, 02/14/2018   Influenza-Unspecified 03/17/2020   Moderna SARS-COV2 Booster Vaccination 05/01/2020, 12/24/2020   Moderna Sars-Covid-2 Vaccination 07/17/2019, 08/18/2019, 05/01/2021   Pneumococcal Conjugate-13 07/16/2014   Pneumococcal Polysaccharide-23 02/20/2013   Td 04/27/2004   Varicella 02/22/2013   Zoster Recombinat (Shingrix) 08/01/2021, 03/01/2022    TDAP status: Due, Education has been provided regarding the importance of this vaccine. Advised may receive this vaccine at local pharmacy or Health Dept. Aware to provide a copy of the vaccination record if obtained from local pharmacy or Health Dept. Verbalized acceptance and understanding.  Flu Vaccine status: Up to date  Pneumococcal vaccine status: Up to date  Covid-19 vaccine status: Completed vaccines  Qualifies for Shingles Vaccine? Yes   Zostavax completed Yes   Shingrix Completed?: Yes  Screening Tests Health Maintenance  Topic Date Due   DTaP/Tdap/Td (2 - Tdap) 04/27/2014   COVID-19 Vaccine (6 - 2023-24 season) 02/10/2022   Medicare Annual Wellness (AWV)  08/10/2023  MAMMOGRAM  12/31/2023   COLONOSCOPY (Pts 45-37yr Insurance coverage will need to be confirmed)  08/02/2025   Pneumonia Vaccine 75 Years old  Completed   INFLUENZA VACCINE  Completed   DEXA SCAN  Completed    Hepatitis C Screening  Completed   Zoster Vaccines- Shingrix  Completed   HPV VACCINES  Aged Out    Health Maintenance  Health Maintenance Due  Topic Date Due   DTaP/Tdap/Td (2 - Tdap) 04/27/2014   COVID-19 Vaccine (6 - 2023-24 season) 02/10/2022    Colorectal cancer screening: Type of screening: Colonoscopy. Completed 08/02/22. Repeat every 3 years  Mammogram status: Completed 12/30/21. Repeat every year  Bone Density status: Completed 12/21/14. Results reflect: Bone density results: OSTEOPENIA. Repeat every 2 years.  Lung Cancer Screening: (Low Dose CT Chest recommended if Age 75-80years, 30 pack-year currently smoking OR have quit w/in 15years.) does not qualify.   Lung Cancer Screening Referral: NO  Additional Screening:  Hepatitis C Screening: does qualify; Completed 07/31/13  Vision Screening: Recommended annual ophthalmology exams for early detection of glaucoma and other disorders of the eye. Is the patient up to date with their annual eye exam?  No  Who is the provider or what is the name of the office in which the patient attends annual eye exams? N/a If pt is not established with a provider, would they like to be referred to a provider to establish care? No .   Dental Screening: Recommended annual dental exams for proper oral hygiene  Community Resource Referral / Chronic Care Management: CRR required this visit?  No   CCM required this visit?  No      Plan:     I have personally reviewed and noted the following in the patient's chart:   Medical and social history Use of alcohol, tobacco or illicit drugs  Current medications and supplements including opioid prescriptions. Patient is not currently taking opioid prescriptions. Functional ability and status Nutritional status Physical activity Advanced directives List of other physicians Hospitalizations, surgeries, and ER visits in previous 12 months Vitals Screenings to include cognitive,  depression, and falls Referrals and appointments  In addition, I have reviewed and discussed with patient certain preventive protocols, quality metrics, and best practice recommendations. A written personalized care plan for preventive services as well as general preventive health recommendations were provided to patient.     KQuentin Angst [place]  08/10/2022

## 2022-08-14 ENCOUNTER — Other Ambulatory Visit: Payer: Self-pay | Admitting: Family Medicine

## 2022-09-05 ENCOUNTER — Ambulatory Visit: Payer: Medicare Other | Admitting: Family Medicine

## 2022-09-06 DIAGNOSIS — E785 Hyperlipidemia, unspecified: Secondary | ICD-10-CM | POA: Diagnosis not present

## 2022-09-06 DIAGNOSIS — E559 Vitamin D deficiency, unspecified: Secondary | ICD-10-CM | POA: Diagnosis not present

## 2022-09-06 DIAGNOSIS — I10 Essential (primary) hypertension: Secondary | ICD-10-CM | POA: Diagnosis not present

## 2022-09-07 LAB — LIPID PANEL
Chol/HDL Ratio: 3.1 ratio (ref 0.0–4.4)
Cholesterol, Total: 161 mg/dL (ref 100–199)
HDL: 52 mg/dL (ref >39)
LDL Chol Calc (NIH): 96 mg/dL (ref 0–99)
Triglycerides: 69 mg/dL (ref 0–149)
VLDL Cholesterol Cal: 13 mg/dL (ref 5–40)

## 2022-09-07 LAB — TSH: TSH: 1.35 u[IU]/mL (ref 0.450–4.500)

## 2022-09-07 LAB — CMP14+EGFR
ALT: 21 IU/L (ref 0–32)
AST: 24 IU/L (ref 0–40)
Albumin/Globulin Ratio: 1.6 (ref 1.2–2.2)
Albumin: 4.1 g/dL (ref 3.8–4.8)
Alkaline Phosphatase: 78 IU/L (ref 44–121)
BUN/Creatinine Ratio: 25 (ref 12–28)
BUN: 21 mg/dL (ref 8–27)
Bilirubin Total: 0.3 mg/dL (ref 0.0–1.2)
CO2: 23 mmol/L (ref 20–29)
Calcium: 10 mg/dL (ref 8.7–10.3)
Chloride: 102 mmol/L (ref 96–106)
Creatinine, Ser: 0.85 mg/dL (ref 0.57–1.00)
Globulin, Total: 2.5 g/dL (ref 1.5–4.5)
Glucose: 97 mg/dL (ref 70–99)
Potassium: 4 mmol/L (ref 3.5–5.2)
Sodium: 139 mmol/L (ref 134–144)
Total Protein: 6.6 g/dL (ref 6.0–8.5)
eGFR: 72 mL/min/{1.73_m2} (ref >59)

## 2022-09-07 LAB — CBC
Hematocrit: 34.3 % (ref 34.0–46.6)
Hemoglobin: 11.5 g/dL (ref 11.1–15.9)
MCH: 30.5 pg (ref 26.6–33.0)
MCHC: 33.5 g/dL (ref 31.5–35.7)
MCV: 91 fL (ref 79–97)
Platelets: 320 10*3/uL (ref 150–450)
RBC: 3.77 x10E6/uL (ref 3.77–5.28)
RDW: 12.3 % (ref 11.7–15.4)
WBC: 4.4 10*3/uL (ref 3.4–10.8)

## 2022-09-07 LAB — VITAMIN D 25 HYDROXY (VIT D DEFICIENCY, FRACTURES): Vit D, 25-Hydroxy: 43.1 ng/mL (ref 30.0–100.0)

## 2022-09-12 ENCOUNTER — Encounter: Payer: Self-pay | Admitting: Family Medicine

## 2022-09-12 ENCOUNTER — Ambulatory Visit (INDEPENDENT_AMBULATORY_CARE_PROVIDER_SITE_OTHER): Payer: Medicare Other | Admitting: Family Medicine

## 2022-09-12 VITALS — BP 127/76 | HR 80 | Ht 66.0 in | Wt 202.0 lb

## 2022-09-12 DIAGNOSIS — R0683 Snoring: Secondary | ICD-10-CM

## 2022-09-12 DIAGNOSIS — R5382 Chronic fatigue, unspecified: Secondary | ICD-10-CM | POA: Diagnosis not present

## 2022-09-12 DIAGNOSIS — G4719 Other hypersomnia: Secondary | ICD-10-CM

## 2022-09-12 DIAGNOSIS — M25561 Pain in right knee: Secondary | ICD-10-CM | POA: Diagnosis not present

## 2022-09-12 DIAGNOSIS — G8929 Other chronic pain: Secondary | ICD-10-CM | POA: Diagnosis not present

## 2022-09-12 DIAGNOSIS — M25562 Pain in left knee: Secondary | ICD-10-CM

## 2022-09-12 DIAGNOSIS — E669 Obesity, unspecified: Secondary | ICD-10-CM

## 2022-09-12 DIAGNOSIS — E66811 Obesity, class 1: Secondary | ICD-10-CM

## 2022-09-12 DIAGNOSIS — Z1231 Encounter for screening mammogram for malignant neoplasm of breast: Secondary | ICD-10-CM

## 2022-09-12 DIAGNOSIS — E785 Hyperlipidemia, unspecified: Secondary | ICD-10-CM

## 2022-09-12 DIAGNOSIS — I10 Essential (primary) hypertension: Secondary | ICD-10-CM

## 2022-09-12 MED ORDER — WEGOVY 0.25 MG/0.5ML ~~LOC~~ SOAJ: 0.2500 mg | SUBCUTANEOUS | 0 refills | Status: DC

## 2022-09-12 NOTE — Assessment & Plan Note (Signed)
Dr Aline Brochure to eval and manage bilateral, knee pain, torn meniscus in left knee, 3 years ago severe pain

## 2022-09-12 NOTE — Patient Instructions (Addendum)
F/U in 4 months,call if you need me sooner  Pls schedule mammogram at checkout   Covid booster is due now   [facility] is prescribed , IF covered, then let me know so I can send another month supply and see you sooner  Excellent labs   I strongly recommend evaluation for sleep apnea, which needs to be treated if you have it   You are referred to Orthopedics for mid April, re left knee pain

## 2022-09-17 ENCOUNTER — Encounter: Payer: Self-pay | Admitting: Family Medicine

## 2022-09-17 DIAGNOSIS — G4719 Other hypersomnia: Secondary | ICD-10-CM | POA: Insufficient documentation

## 2022-09-17 NOTE — Progress Notes (Signed)
Karen [name]     MRN: [id number]      DOB: [birth year]   HPI [name] is here for follow up and re-evaluation of chronic medical conditions, medication management and review of any available recent lab and radiology data.  Preventive health is updated, specifically  Cancer screening and Immunization.   Questions or concerns regarding consultations or procedures which the PT has had in the interim are  addressed. Wants med to help with weight loss , frustrated with inability to stick with dietary change or weight loss. Still delaying knee surgery which she needs Living situation her main problem which negatively affects her health states cannot change it at this time however The PT denies any adverse reactions to current medications since the last visit.c/o excess daytime sleepiness and fatigue , does hav e some h/o snoring ROS Denies recent fever or chills. Denies sinus pressure, nasal congestion, ear pain or sore throat. Denies chest congestion, productive cough or wheezing. Denies chest pains, palpitations and leg swelling Denies abdominal pain, nausea, vomiting,diarrhea or constipation.   Denies dysuria, frequency, hesitancy or incontinence. Denies skin break down or rash.   PE  BP 127/76 (BP Location: Right Arm, Patient Position: Sitting, Cuff Size: Large)   Pulse 80   Ht 5\' 6"  (1.676 m)   Wt 202 lb (91.6 kg)   SpO2 93%   BMI 32.60 kg/m   Patient alert and oriented and in no cardiopulmonary distress.  HEENT: No facial asymmetry, EOMI,     Neck supple .  Chest: Clear to auscultation bilaterally.  CVS: S1, S2 no murmurs, no S3.Regular rate.  ABD: Soft non tender.   Ext: No edema  MS: Adequate ROM spine, shoulders, hips and reduced in  knees.  Skin: Intact, no ulcerations or rash noted.  Psych: Good eye contact, normal affect. Memory intact not anxious or depressed appearing.  CNS: CN 2-12 intact, power,  normal throughout.no focal deficits  noted.   Assessment & Plan Chronic knee pain Dr Romeo Apple to eval and manage bilateral, knee pain, torn meniscus in left knee, 3 years ago severe pain  Essential hypertension, benign Controlled, no change in medication DASH diet and commitment to daily physical activity for a minimum of 30 minutes discussed and encouraged, as a part of hypertension management. The importance of attaining a healthy weight is also discussed.     09/12/2022    2:59 PM 08/02/2022   10:00 AM 08/02/2022    8:48 AM 05/18/2022    1:19 PM 04/28/2022    8:26 AM 04/06/2022    8:10 AM 11/09/2021    8:34 AM  BP/Weight  Systolic BP 127 105 137  146 126 138  Diastolic BP 76 45 62  74 71 74  Wt. (Lbs) 202  200 202 202.2 206.8   BMI 32.6 kg/m2  33.28 kg/m2 32.6 kg/m2 32.64 kg/m2 33.38 kg/m2        Hyperlipidemia LDL goal <100 Hyperlipidemia:Low fat diet discussed and encouraged.   Lipid Panel  Lab Results  Component Value Date   CHOL 161 09/06/2022   HDL 52 09/06/2022   LDLCALC 96 09/06/2022   TRIG 69 09/06/2022   CHOLHDL 3.1 09/06/2022       Obesity (BMI 30.0-34.9)  Patient re-educated about  the importance of commitment to a  minimum of 150 minutes of exercise per week as able.  The importance of healthy food choices with portion control discussed, as well as eating regularly and within a 12 hour window  most days. The need to choose "clean , green" food 50 to 75% of the time is discussed, as well as to make water the primary drink and set a goal of 64 ounces water daily.       09/12/2022    2:59 PM 08/02/2022    8:48 AM 05/18/2022    1:19 PM  Weight /BMI  Weight 202 lb 200 lb 202 lb  Height 5\' 6"  (1.676 m) 5\' 5"  (1.651 m) 5\' 6"  (1.676 m)  BMI 32.6 kg/m2 33.28 kg/m2 32.6 kg/m2      Excessive daytime sleepiness Fatigue and snoring with EDS, need eval for sleep apnea, referred to pulmonary and encouraged o follow through

## 2022-09-17 NOTE — Assessment & Plan Note (Signed)
  Patient re-educated about  the importance of commitment to a  minimum of 150 minutes of exercise per week as able.  The importance of healthy food choices with portion control discussed, as well as eating regularly and within a 12 hour window most days. The need to choose "clean , green" food 50 to 75% of the time is discussed, as well as to make water the primary drink and set a goal of 64 ounces water daily.       09/12/2022    2:59 PM 08/02/2022    8:48 AM 05/18/2022    1:19 PM  Weight /BMI  Weight 202 lb 200 lb 202 lb  Height 5\' 6"  (1.676 m) 5\' 5"  (1.651 m) 5\' 6"  (1.676 m)  BMI 32.6 kg/m2 33.28 kg/m2 32.6 kg/m2

## 2022-09-17 NOTE — Assessment & Plan Note (Signed)
Hyperlipidemia:Low fat diet discussed and encouraged.   Lipid Panel  Lab Results  Component Value Date   CHOL 161 09/06/2022   HDL 52 09/06/2022   LDLCALC 96 09/06/2022   TRIG 69 09/06/2022   CHOLHDL 3.1 09/06/2022

## 2022-09-17 NOTE — Assessment & Plan Note (Signed)
Controlled, no change in medication DASH diet and commitment to daily physical activity for a minimum of 30 minutes discussed and encouraged, as a part of hypertension management. The importance of attaining a healthy weight is also discussed.     09/12/2022    2:59 PM 08/02/2022   10:00 AM 08/02/2022    8:48 AM 05/18/2022    1:19 PM 04/28/2022    8:26 AM 04/06/2022    8:10 AM 11/09/2021    8:34 AM  BP/Weight  Systolic BP 127 105 137  146 126 138  Diastolic BP 76 45 62  74 71 74  Wt. (Lbs) 202  200 202 202.2 206.8   BMI 32.6 kg/m2  33.28 kg/m2 32.6 kg/m2 32.64 kg/m2 33.38 kg/m2

## 2022-09-17 NOTE — Assessment & Plan Note (Signed)
Fatigue and snoring with EDS, need eval for sleep apnea, referred to pulmonary and encouraged o follow through

## 2022-09-25 ENCOUNTER — Other Ambulatory Visit: Payer: Self-pay

## 2022-09-25 ENCOUNTER — Encounter: Payer: Self-pay | Admitting: Orthopedic Surgery

## 2022-09-25 ENCOUNTER — Other Ambulatory Visit (INDEPENDENT_AMBULATORY_CARE_PROVIDER_SITE_OTHER): Payer: Medicare Other

## 2022-09-25 ENCOUNTER — Ambulatory Visit: Payer: Medicare Other | Admitting: Orthopedic Surgery

## 2022-09-25 VITALS — BP 148/62 | HR 93 | Ht 65.5 in | Wt 207.4 lb

## 2022-09-25 DIAGNOSIS — M170 Bilateral primary osteoarthritis of knee: Secondary | ICD-10-CM

## 2022-09-25 DIAGNOSIS — M1711 Unilateral primary osteoarthritis, right knee: Secondary | ICD-10-CM

## 2022-09-25 DIAGNOSIS — M1712 Unilateral primary osteoarthritis, left knee: Secondary | ICD-10-CM

## 2022-09-25 DIAGNOSIS — G8929 Other chronic pain: Secondary | ICD-10-CM

## 2022-09-25 DIAGNOSIS — M25561 Pain in right knee: Secondary | ICD-10-CM | POA: Diagnosis not present

## 2022-09-25 DIAGNOSIS — M25562 Pain in left knee: Secondary | ICD-10-CM | POA: Diagnosis not present

## 2022-09-25 MED ORDER — METHYLPREDNISOLONE ACETATE 40 MG/ML IJ SUSP
40.0000 mg | Freq: Once | INTRAMUSCULAR | Status: AC
  Administered 2022-09-25: 40 mg via INTRA_ARTICULAR

## 2022-09-25 NOTE — Addendum Note (Signed)
Addended by: Debby Bud R on: 09/25/2022 05:44 PM   Modules accepted: Orders

## 2022-09-25 NOTE — Progress Notes (Signed)
Chief Complaint  Patient presents with   Knee Pain    LT knee-torn meniscus-wants to discuss knee scope RT knee-giving her more problems-unable to go to the track and walk due to pain    History this is a 77 year old female had a torn medial meniscus and had a MRI done which showed that.  She has bilateral knee pain with osteoarthritis and increasing symptoms on the right knee.  She says she only takes 1-2 Tylenol a day and has backed off to 1 Tylenol a day although ibuprofen did better.  Her primary care doctor told her not to take the ibuprofen and instead use Tylenol  Functionally she has lost a bit of ability to walk for long distances she has some stiffness in the morning but her pain is well-controlled and her functional level has been compromise some but not that much   Physical Exam Vitals and nursing note reviewed.  Constitutional:      Appearance: Normal appearance.  HENT:     Head: Normocephalic and atraumatic.  Eyes:     General: No scleral icterus.       Right eye: No discharge.        Left eye: No discharge.     Extraocular Movements: Extraocular movements intact.     Conjunctiva/sclera: Conjunctivae normal.     Pupils: Pupils are equal, round, and reactive to light.  Cardiovascular:     Rate and Rhythm: Normal rate.     Pulses: Normal pulses.  Musculoskeletal:     Right knee: No effusion.     Instability Tests: Medial McMurray test negative and lateral McMurray test negative.     Left knee: No effusion.     Instability Tests: Medial McMurray test negative and lateral McMurray test negative.  Skin:    General: Skin is warm and dry.     Capillary Refill: Capillary refill takes less than 2 seconds.  Neurological:     General: No focal deficit present.     Mental Status: She is alert and oriented to person, place, and time.     Gait: Gait abnormal.  Psychiatric:        Mood and Affect: Mood normal.        Behavior: Behavior normal.        Thought Content: Thought  content normal.        Judgment: Judgment normal.     Right Knee Exam   Muscle Strength  The patient has normal right knee strength.  Tenderness  The patient is experiencing tenderness in the medial joint line.  Range of Motion  Extension:  normal  Flexion:  120 normal   Tests  McMurray:  Medial - negative Lateral - negative Varus: negative Valgus: negative Drawer:  Anterior - negative    Posterior - negative  Other  Erythema: absent Scars: absent Sensation: normal Pulse: present Swelling: none Effusion: no effusion present   Left Knee Exam   Muscle Strength  The patient has normal left knee strength.  Tenderness  The patient is experiencing tenderness in the medial joint line.  Range of Motion  Extension:  normal  Flexion:  120 normal   Tests  McMurray:  Medial - negative Lateral - negative Varus: negative Valgus: negative Drawer:  Anterior - negative     Posterior - negative  Other  Erythema: absent Scars: absent Sensation: normal Pulse: present Swelling: none Effusion: no effusion present     Imaging both knees see report the right knee is in  varus has grade 3 arthritis left knee is in valgus but physiologic and has grade 3 arthritis as well  After discussion patient is not symptomatic enough from a functional deficit or pain control reason to have total knee replacement  Recommend bilateral knee injections continue activity modification and Tylenol as needed for exacerbations of taking some ibuprofen is okay  Procedure note for bilateral knee injections  Procedure note left knee injection verbal consent was obtained to inject left knee joint  Timeout was completed to confirm the site of injection  The medications used were 40 mg depomedrol and 3 cc of 1% lidocaine  Anesthesia was provided by ethyl chloride and the skin was prepped with alcohol.  After cleaning the skin with alcohol a 20-gauge needle was used to inject the left knee joint.  There were no complications. A sterile bandage was applied.   Procedure note right knee injection verbal consent was obtained to inject right knee joint  Timeout was completed to confirm the site of injection  The medications used were 40 mg depomedrol and 3 cc of 1% lidocaine  Anesthesia was provided by ethyl chloride and the skin was prepped with alcohol.  After cleaning the skin with alcohol a 20-gauge needle was used to inject the right knee joint. There were no complications. A sterile bandage was applied.

## 2022-09-26 ENCOUNTER — Other Ambulatory Visit: Payer: Self-pay | Admitting: Family Medicine

## 2022-11-08 ENCOUNTER — Institutional Professional Consult (permissible substitution): Payer: Medicare Other | Admitting: Pulmonary Disease

## 2022-11-20 ENCOUNTER — Other Ambulatory Visit: Payer: Self-pay | Admitting: Family Medicine

## 2022-12-28 ENCOUNTER — Other Ambulatory Visit: Payer: Self-pay | Admitting: Family Medicine

## 2023-01-03 ENCOUNTER — Ambulatory Visit (HOSPITAL_COMMUNITY)
Admission: RE | Admit: 2023-01-03 | Discharge: 2023-01-03 | Disposition: A | Discharge status: Home / Self Care | Payer: Medicare Other | Source: Ambulatory Visit | Attending: Family Medicine | Admitting: Family Medicine

## 2023-01-03 DIAGNOSIS — Z1231 Encounter for screening mammogram for malignant neoplasm of breast: Secondary | ICD-10-CM | POA: Diagnosis not present

## 2023-01-12 ENCOUNTER — Ambulatory Visit: Payer: Medicare Other | Admitting: Family Medicine

## 2023-02-06 ENCOUNTER — Encounter: Payer: Self-pay | Admitting: Family Medicine

## 2023-02-06 ENCOUNTER — Ambulatory Visit (INDEPENDENT_AMBULATORY_CARE_PROVIDER_SITE_OTHER): Payer: Medicare Other | Admitting: Family Medicine

## 2023-02-06 VITALS — BP 125/71 | HR 87 | Ht 65.0 in | Wt 212.0 lb

## 2023-02-06 DIAGNOSIS — E785 Hyperlipidemia, unspecified: Secondary | ICD-10-CM | POA: Diagnosis not present

## 2023-02-06 DIAGNOSIS — M1711 Unilateral primary osteoarthritis, right knee: Secondary | ICD-10-CM | POA: Diagnosis not present

## 2023-02-06 DIAGNOSIS — E669 Obesity, unspecified: Secondary | ICD-10-CM

## 2023-02-06 DIAGNOSIS — I10 Essential (primary) hypertension: Secondary | ICD-10-CM | POA: Diagnosis not present

## 2023-02-06 MED ORDER — PHENTERMINE HCL 15 MG PO CAPS: 15.0000 mg | ORAL_CAPSULE | ORAL | 2 refills | Status: DC

## 2023-02-06 NOTE — Assessment & Plan Note (Signed)
Benefited from [place] articular injections will return

## 2023-02-06 NOTE — Progress Notes (Signed)
Karen [name]     MRN: [id number]      DOB: [birth year]  Chief Complaint  Patient presents with   Follow-up    Follow up    HPI Karen [name] is here for follow up and re-evaluation of chronic medical conditions, medication management and review of any available recent lab and radiology data.  Preventive health is updated, specifically  Cancer screening and Immunization.   Questions or concerns regarding consultations or procedures which the PT has had in the interim are  addressed.Knee replacement not required now The PT denies any adverse reactions to current medications since the last visit.  Concerned aboutt ongoing weight gain, will try phentermine, states eats out alot ROS Denies recent fever or chills. Denies sinus pressure, nasal congestion, ear pain or sore throat. Denies chest congestion, productive cough or wheezing. Denies chest pains, palpitations and leg swelling Denies abdominal pain, nausea, vomiting,diarrhea or constipation.   Denies dysuria, frequency, hesitancy or incontinence. Denies uncontrolled  joint pain, swelling and limitation in mobility. Denies headaches, seizures, numbness, or tingling. Denies depression, anxiety or insomnia. Denies skin break down or rash.   PE  BP 125/71 (BP Location: Right Arm, Patient Position: Sitting, Cuff Size: Large)   Pulse 87   Ht 5\' 5"  (1.651 m)   Wt 212 lb 0.6 oz (96.2 kg)   SpO2 98%   BMI 35.29 kg/m   Patient alert and oriented and in no cardiopulmonary distress.  HEENT: No facial asymmetry, EOMI,     Neck supple .  Chest: Clear to auscultation bilaterally.  CVS: S1, S2 no murmurs, no S3.Regular rate.  ABD: Soft non tender.   Ext: No edema  MS: Adequate ROM spine, shoulders, hips and reduced in  knees.  Skin: Intact, no ulcerations or rash noted.  Psych: Good eye contact, normal affect. Memory intact not anxious or depressed appearing.  CNS: CN 2-12 intact, power,  normal throughout.no focal deficits  noted.   Assessment & Plan  Hyperlipidemia LDL goal <100 Hyperlipidemia:Low fat diet discussed and encouraged.   Lipid Panel  Lab Results  Component Value Date   CHOL 161 09/06/2022   HDL 52 09/06/2022   LDLCALC 96 09/06/2022   TRIG 69 09/06/2022   CHOLHDL 3.1 09/06/2022     Updated lab needed at/ before next visit.   Essential hypertension, benign Controlled, no change in medication DASH diet and commitment to daily physical activity for a minimum of 30 minutes discussed and encouraged, as a part of hypertension management. The importance of attaining a healthy weight is also discussed.     02/06/2023    8:06 AM 09/25/2022    4:25 PM 09/12/2022    2:59 PM 08/02/2022   10:00 AM 08/02/2022    8:48 AM 05/18/2022    1:19 PM 04/28/2022    8:26 AM  BP/Weight  Systolic BP 125 148 127 105 137  146  Diastolic BP 71 62 76 45 62  74  Wt. (Lbs) 212.04 207.4 202  200 202 202.2  BMI 35.29 kg/m2 33.99 kg/m2 32.6 kg/m2  33.28 kg/m2 32.6 kg/m2 32.64 kg/m2       Obesity (BMI 30.0-34.9)  Patient re-educated about  the importance of commitment to a  minimum of 150 minutes of exercise per week as able.  The importance of healthy food choices with portion control discussed, as well as eating regularly and within a 12 hour window most days. The need to choose "clean , green" food 50 to 75% of  the time is discussed, as well as to make water the primary drink and set a goal of 64 ounces water daily.       02/06/2023    8:06 AM 09/25/2022    4:25 PM 09/12/2022    2:59 PM  Weight /BMI  Weight 212 lb 0.6 oz 207 lb 6.4 oz 202 lb  Height 5\' 5"  (1.651 m) 5' 5.5" (1.664 m) 5\' 6"  (1.676 m)  BMI 35.29 kg/m2 33.99 kg/m2 32.6 kg/m2    Orsened, start phentermine 15 mg daily

## 2023-02-06 NOTE — Patient Instructions (Signed)
F/u in 3 months, call if you  need me sooner    Fasting lipid, cmp and eGFR  3 to 5 days before nexyt visit    Aim for 64 ounces water, and nothing after 7 PM   New is phentermine capsule 145 mg daily  PLAN what you eat and stick with the plan   Thanks for choosing [facility], we consider it a privelige to serve you.

## 2023-02-06 NOTE — Assessment & Plan Note (Signed)
Hyperlipidemia:Low fat diet discussed and encouraged.   Lipid Panel  Lab Results  Component Value Date   CHOL 161 09/06/2022   HDL 52 09/06/2022   LDLCALC 96 09/06/2022   TRIG 69 09/06/2022   CHOLHDL 3.1 09/06/2022     Updated lab needed at/ before next visit.

## 2023-02-06 NOTE — Assessment & Plan Note (Signed)
  Patient re-educated about  the importance of commitment to a  minimum of 150 minutes of exercise per week as able.  The importance of healthy food choices with portion control discussed, as well as eating regularly and within a 12 hour window most days. The need to choose "clean , green" food 50 to 75% of the time is discussed, as well as to make water the primary drink and set a goal of 64 ounces water daily.       02/06/2023    8:06 AM 09/25/2022    4:25 PM 09/12/2022    2:59 PM  Weight /BMI  Weight 212 lb 0.6 oz 207 lb 6.4 oz 202 lb  Height 5\' 5"  (1.651 m) 5' 5.5" (1.664 m) 5\' 6"  (1.676 m)  BMI 35.29 kg/m2 33.99 kg/m2 32.6 kg/m2    Orsened, start phentermine 15 mg daily

## 2023-02-06 NOTE — Assessment & Plan Note (Signed)
Controlled, no change in medication DASH diet and commitment to daily physical activity for a minimum of 30 minutes discussed and encouraged, as a part of hypertension management. The importance of attaining a healthy weight is also discussed.     02/06/2023    8:06 AM 09/25/2022    4:25 PM 09/12/2022    2:59 PM 08/02/2022   10:00 AM 08/02/2022    8:48 AM 05/18/2022    1:19 PM 04/28/2022    8:26 AM  BP/Weight  Systolic BP 125 148 127 105 137  146  Diastolic BP 71 62 76 45 62  74  Wt. (Lbs) 212.04 207.4 202  200 202 202.2  BMI 35.29 kg/m2 33.99 kg/m2 32.6 kg/m2  33.28 kg/m2 32.6 kg/m2 32.64 kg/m2

## 2023-02-16 ENCOUNTER — Other Ambulatory Visit: Payer: Self-pay | Admitting: Family Medicine

## 2023-03-27 ENCOUNTER — Other Ambulatory Visit: Payer: Self-pay | Admitting: Family Medicine

## 2023-05-02 DIAGNOSIS — I10 Essential (primary) hypertension: Secondary | ICD-10-CM | POA: Diagnosis not present

## 2023-05-02 DIAGNOSIS — E785 Hyperlipidemia, unspecified: Secondary | ICD-10-CM | POA: Diagnosis not present

## 2023-05-03 LAB — CMP14+EGFR
ALT: 21 [IU]/L (ref 0–32)
AST: 24 [IU]/L (ref 0–40)
Albumin: 4.2 g/dL (ref 3.8–4.8)
Alkaline Phosphatase: 75 [IU]/L (ref 44–121)
BUN/Creatinine Ratio: 20 (ref 12–28)
BUN: 18 mg/dL (ref 8–27)
Bilirubin Total: 0.5 mg/dL (ref 0.0–1.2)
CO2: 22 mmol/L (ref 20–29)
Calcium: 10.3 mg/dL (ref 8.7–10.3)
Chloride: 103 mmol/L (ref 96–106)
Creatinine, Ser: 0.9 mg/dL (ref 0.57–1.00)
Globulin, Total: 2.6 g/dL (ref 1.5–4.5)
Glucose: 93 mg/dL (ref 70–99)
Potassium: 4.2 mmol/L (ref 3.5–5.2)
Sodium: 141 mmol/L (ref 134–144)
Total Protein: 6.8 g/dL (ref 6.0–8.5)
eGFR: 67 mL/min/{1.73_m2} (ref >59)

## 2023-05-03 LAB — LIPID PANEL
Chol/HDL Ratio: 3.2 {ratio} (ref 0.0–4.4)
Cholesterol, Total: 173 mg/dL (ref 100–199)
HDL: 54 mg/dL (ref >39)
LDL Chol Calc (NIH): 105 mg/dL — ABNORMAL HIGH (ref 0–99)
Triglycerides: 75 mg/dL (ref 0–149)
VLDL Cholesterol Cal: 14 mg/dL (ref 5–40)

## 2023-05-08 ENCOUNTER — Encounter: Payer: Self-pay | Admitting: Family Medicine

## 2023-05-08 ENCOUNTER — Ambulatory Visit (INDEPENDENT_AMBULATORY_CARE_PROVIDER_SITE_OTHER): Payer: Medicare Other | Admitting: Family Medicine

## 2023-05-08 VITALS — BP 108/80 | HR 82 | Ht 65.0 in | Wt 192.0 lb

## 2023-05-08 DIAGNOSIS — R42 Dizziness and giddiness: Secondary | ICD-10-CM

## 2023-05-08 DIAGNOSIS — I10 Essential (primary) hypertension: Secondary | ICD-10-CM

## 2023-05-08 DIAGNOSIS — M1711 Unilateral primary osteoarthritis, right knee: Secondary | ICD-10-CM

## 2023-05-08 DIAGNOSIS — E66811 Obesity, class 1: Secondary | ICD-10-CM

## 2023-05-08 DIAGNOSIS — E559 Vitamin D deficiency, unspecified: Secondary | ICD-10-CM | POA: Diagnosis not present

## 2023-05-08 DIAGNOSIS — E785 Hyperlipidemia, unspecified: Secondary | ICD-10-CM

## 2023-05-08 MED ORDER — MECLIZINE HCL 12.5 MG PO TABS: 12.5000 mg | ORAL_TABLET | Freq: Three times a day (TID) | ORAL | 1 refills | Status: AC | PRN

## 2023-05-08 MED ORDER — PHENTERMINE HCL 15 MG PO CAPS: ORAL_CAPSULE | ORAL | 0 refills | Status: DC

## 2023-05-08 NOTE — Assessment & Plan Note (Signed)
Controlled, no change in medication DASH diet and commitment to daily physical activity for a minimum of 30 minutes discussed and encouraged, as a part of hypertension management. The importance of attaining a healthy weight is also discussed.     05/08/2023    9:39 AM 05/08/2023    8:42 AM 02/06/2023    8:06 AM 09/25/2022    4:25 PM 09/12/2022    2:59 PM 08/02/2022   10:00 AM 08/02/2022    8:48 AM  BP/Weight  Systolic BP 108 100 125 148 127 105 137  Diastolic BP 80 63 71 62 76 45 62  Wt. (Lbs)  192.04 212.04 207.4 202  200  BMI  31.96 kg/m2 35.29 kg/m2 33.99 kg/m2 32.6 kg/m2  33.28 kg/m2

## 2023-05-08 NOTE — Patient Instructions (Addendum)
F/U in 13 weeks, call if you need me sooner  CBC, TSH and Chem 7 and vit D 3 days before next visit, non fasting  Congrats on weight loss,reduce dose of phentermine to 3 days per week   Meclizine is being sent in  Commit to daily stool softener, and eat food which helps you to go to bathroom  like greens  Reduce phentermine to 3 days per week   Weight loss goal of 6 to 8 pounds  Eat regularly  Thanks for choosing [facility], we consider it a privelige to serve you.

## 2023-05-08 NOTE — Assessment & Plan Note (Signed)
Obesity associate with Hypertension, dylipidemia, arthritis Improved with significant, mayb excessive weight loss on low dose phentermine Reduce to 3 times weekly  Patient re-educated about  the importance of commitment to a  minimum of 150 minutes of exercise per week as able.  The importance of healthy food choices with portion control discussed, as well as eating regularly and within a 12 hour window most days. The need to choose "clean , green" food 50 to 75% of the time is discussed, as well as to make water the primary drink and set a goal of 64 ounces water daily.       05/08/2023    8:42 AM 02/06/2023    8:06 AM 09/25/2022    4:25 PM  Weight /BMI  Weight 192 lb 0.6 oz 212 lb 0.6 oz 207 lb 6.4 oz  Height 5\' 5"  (1.651 m) 5\' 5"  (1.651 m) 5' 5.5" (1.664 m)  BMI 31.96 kg/m2 35.29 kg/m2 33.99 kg/m2

## 2023-05-08 NOTE — Assessment & Plan Note (Signed)
Hyperlipidemia:Low fat diet discussed and encouraged.   Lipid Panel  Lab Results  Component Value Date   CHOL 173 05/02/2023   HDL 54 05/02/2023   LDLCALC 105 (H) 05/02/2023   TRIG 75 05/02/2023   CHOLHDL 3.2 05/02/2023     Needs to reduce butter and nuts

## 2023-05-08 NOTE — Assessment & Plan Note (Signed)
Updated lab needed at/ before next visit.   

## 2023-05-08 NOTE — Assessment & Plan Note (Signed)
Intermittent episdoes, meclizine on hand for as needed use, reports maybe every 2 months

## 2023-05-08 NOTE — Progress Notes (Signed)
Karen [name]     MRN: [id number]      DOB: February 22, 1946  Chief Complaint  Patient presents with   Follow-up    Follow up    HPI Karen [name] is here for follow up and re-evaluation of chronic medical conditions, medication management and review of any available recent lab and radiology data.  Preventive health is updated, specifically  Cancer screening and Immunization.   With weight loss, knee pain is less so holding off on surgery. Generally feels better, eats twice per day. The PT does note some constipation on n phentermine will increase fiber and water and also take stool softener daily.   ROS Denies recent fever or chills. Denies sinus pressure, nasal congestion, ear pain or sore throat. Denies chest congestion, productive cough or wheezing. Denies chest pains, palpitations and leg swelling Denies abdominal pain, nausea, vomiting,diarrhea or constipation.   Denies dysuria, frequency, hesitancy or incontinence. Denies uncontrolled  joint pain, swelling and limitation in mobility. Denies headaches, seizures, numbness, or tingling. Denies depression, anxiety or insomnia. Denies skin break down or rash.   PE  BP 108/80   Pulse 82   Ht 5\' 5"  (1.651 m)   Wt 192 lb 0.6 oz (87.1 kg)   SpO2 97%   BMI 31.96 kg/m   Patient alert and oriented and in no cardiopulmonary distress.  HEENT: No facial asymmetry, EOMI,     Neck supple .  Chest: Clear to auscultation bilaterally.  CVS: S1, S2 no murmurs, no S3.Regular rate.  ABD: Soft non tender.   Ext: No edema  MS: Adequate ROM spine, shoulders, hips and  reduced in knees.  Skin: Intact, no ulcerations or rash noted.  Psych: Good eye contact, normal affect. Memory intact not anxious or depressed appearing.  CNS: CN 2-12 intact, power,  normal throughout.no focal deficits noted.   Assessment & Plan  Essential hypertension, benign Controlled, no change in medication DASH diet and commitment to daily physical  activity for a minimum of 30 minutes discussed and encouraged, as a part of hypertension management. The importance of attaining a healthy weight is also discussed.     05/08/2023    9:39 AM 05/08/2023    8:42 AM 02/06/2023    8:06 AM 09/25/2022    4:25 PM 09/12/2022    2:59 PM 08/02/2022   10:00 AM 08/02/2022    8:48 AM  BP/Weight  Systolic BP 108 100 125 148 127 105 137  Diastolic BP 80 63 71 62 76 45 62  Wt. (Lbs)  192.04 212.04 207.4 202  200  BMI  31.96 kg/m2 35.29 kg/m2 33.99 kg/m2 32.6 kg/m2  33.28 kg/m2       Hyperlipidemia LDL goal <100 Hyperlipidemia:Low fat diet discussed and encouraged.   Lipid Panel  Lab Results  Component Value Date   CHOL 173 05/02/2023   HDL 54 05/02/2023   LDLCALC 105 (H) 05/02/2023   TRIG 75 05/02/2023   CHOLHDL 3.2 05/02/2023     Needs to reduce butter and nuts  Obesity (BMI 30.0-34.9) Obesity associate with Hypertension, dylipidemia, arthritis Improved with significant, mayb excessive weight loss on low dose phentermine Reduce to 3 times weekly  Patient re-educated about  the importance of commitment to a  minimum of 150 minutes of exercise per week as able.  The importance of healthy food choices with portion control discussed, as well as eating regularly and within a 12 hour window most days. The need to choose "clean , green" food 50  to 75% of the time is discussed, as well as to make water the primary drink and set a goal of 64 ounces water daily.       05/08/2023    8:42 AM 02/06/2023    8:06 AM 09/25/2022    4:25 PM  Weight /BMI  Weight 192 lb 0.6 oz 212 lb 0.6 oz 207 lb 6.4 oz  Height 5\' 5"  (1.651 m) 5\' 5"  (1.651 m) 5' 5.5" (1.664 m)  BMI 31.96 kg/m2 35.29 kg/m2 33.99 kg/m2      Osteoarthritis of knee Reports less pain and stiffness with weight loss as to be expected, and that is good, will f/u with Orthopedics in next 1 to 2 monhts  Vertigo Intermittent episdoes, meclizine on hand for as needed use, reports maybe  every 2 months  Vitamin D deficiency Updated lab needed at/ before next visit.

## 2023-05-08 NOTE — Assessment & Plan Note (Signed)
Reports less pain and stiffness with weight loss as to be expected, and that is good, will f/u with Orthopedics in next 1 to 2 monhts

## 2023-05-15 ENCOUNTER — Other Ambulatory Visit: Payer: Self-pay | Admitting: Family Medicine

## 2023-05-21 ENCOUNTER — Other Ambulatory Visit: Payer: Self-pay | Admitting: Family Medicine

## 2023-05-21 NOTE — Telephone Encounter (Signed)
Copied from CRM [id number]. Topic: Clinical - Medication Refill >> May 21, 2023  4:17 PM Maxwell Marion wrote: Most Recent Primary Care Visit:  Provider: Kerri Perches  Department: [facility]  Visit Type: OFFICE VISIT  Date: 05/08/2023  Medication: lisinopril-hydrochlorothiazide (ZESTORETIC) 20-25 MG tablet phentermine 15 MG capsule  Has the patient contacted their pharmacy? Yes, they advised her that nothing has been sent over from Dr.  (Agent: If no, request that the patient contact the pharmacy for the refill. If patient does not wish to contact the pharmacy document the reason why and proceed with request.) (Agent: If yes, when and what did the pharmacy advise?)  Is this the correct pharmacy for this prescription? Yes If no, delete pharmacy and type the correct one.  This is the patient's preferred pharmacy:  [facility] [address], [place] - [address] [place] [address] Phone: [phone] Fax: [phone]   Has the prescription been filled recently?   Is the patient out of the medication?   Has the patient been seen for an appointment in the last year OR does the patient have an upcoming appointment?   Can we respond through MyChart?   Agent: Please be advised that Rx refills may take up to 3 business days. We ask that you follow-up with your pharmacy.

## 2023-05-22 ENCOUNTER — Other Ambulatory Visit: Payer: Self-pay

## 2023-05-22 MED ORDER — LISINOPRIL-HYDROCHLOROTHIAZIDE 20-25 MG PO TABS: 1.0000 | ORAL_TABLET | Freq: Every day | ORAL | 0 refills | Status: DC

## 2023-05-25 ENCOUNTER — Other Ambulatory Visit: Payer: Self-pay | Admitting: Family Medicine

## 2023-05-25 MED ORDER — PHENTERMINE HCL 15 MG PO CAPS: 15.0000 mg | ORAL_CAPSULE | ORAL | 2 refills | Status: DC

## 2023-06-26 ENCOUNTER — Other Ambulatory Visit: Payer: Self-pay | Admitting: Family Medicine

## 2023-07-23 ENCOUNTER — Telehealth: Payer: Self-pay | Admitting: Family Medicine

## 2023-07-23 NOTE — Telephone Encounter (Signed)
 Copied from CRM [id number]. Topic: General - Other >> Jul 23, 2023  1:13 PM Felizardo Hotter wrote: Reason for CRM: Pt called to receive excuse for jury summons per her vertigo. Please call pt when she can pick up [phone]

## 2023-07-31 NOTE — Telephone Encounter (Signed)
Lmtrc-kg

## 2023-08-08 DIAGNOSIS — I10 Essential (primary) hypertension: Secondary | ICD-10-CM | POA: Diagnosis not present

## 2023-08-08 DIAGNOSIS — E559 Vitamin D deficiency, unspecified: Secondary | ICD-10-CM | POA: Diagnosis not present

## 2023-08-09 LAB — BMP8+EGFR
BUN/Creatinine Ratio: 20 (ref 12–28)
BUN: 19 mg/dL (ref 8–27)
CO2: 24 mmol/L (ref 20–29)
Calcium: 10.4 mg/dL — ABNORMAL HIGH (ref 8.7–10.3)
Chloride: 102 mmol/L (ref 96–106)
Creatinine, Ser: 0.94 mg/dL (ref 0.57–1.00)
Glucose: 90 mg/dL (ref 70–99)
Potassium: 3.9 mmol/L (ref 3.5–5.2)
Sodium: 139 mmol/L (ref 134–144)
eGFR: 63 mL/min/{1.73_m2} (ref >59)

## 2023-08-09 LAB — CBC
Hematocrit: 36.5 % (ref 34.0–46.6)
Hemoglobin: 12 g/dL (ref 11.1–15.9)
MCH: 30.8 pg (ref 26.6–33.0)
MCHC: 32.9 g/dL (ref 31.5–35.7)
MCV: 94 fL (ref 79–97)
Platelets: 342 10*3/uL (ref 150–450)
RBC: 3.9 x10E6/uL (ref 3.77–5.28)
RDW: 11.8 % (ref 11.7–15.4)
WBC: 3.9 10*3/uL (ref 3.4–10.8)

## 2023-08-09 LAB — TSH: TSH: 1.33 u[IU]/mL (ref 0.450–4.500)

## 2023-08-09 LAB — VITAMIN D 25 HYDROXY (VIT D DEFICIENCY, FRACTURES): Vit D, 25-Hydroxy: 40.3 ng/mL (ref 30.0–100.0)

## 2023-08-14 ENCOUNTER — Telehealth: Payer: Medicare Other | Admitting: Family Medicine

## 2023-08-17 ENCOUNTER — Other Ambulatory Visit: Payer: Self-pay | Admitting: Family Medicine

## 2023-09-10 ENCOUNTER — Ambulatory Visit: Payer: Medicare Other

## 2023-09-10 VITALS — BP 117/68 | Ht 65.0 in | Wt 192.0 lb

## 2023-09-10 DIAGNOSIS — Z2821 Immunization not carried out because of patient refusal: Secondary | ICD-10-CM

## 2023-09-10 DIAGNOSIS — Z0000 Encounter for general adult medical examination without abnormal findings: Secondary | ICD-10-CM

## 2023-09-10 NOTE — Progress Notes (Signed)
 Because this visit was a virtual/telehealth visit,  certain criteria was not obtained, such a blood pressure, CBG if applicable, and timed get up and go. Any medications not marked as "taking" were not mentioned during the medication reconciliation part of the visit. Any vitals not documented were not able to be obtained due to this being a telehealth visit or patient was unable to self-report a recent blood pressure reading due to a lack of equipment at home via telehealth. Vitals that have been documented are verbally provided by the patient.   Subjective:   Karen [name] is a 78 y.o. who presents for a Medicare Wellness preventive visit.  Visit Complete: Virtual I connected with  BRINDY HIGGINBOTHAM on 09/10/23 by a audio enabled telemedicine application and verified that I am speaking with the correct person using two identifiers.  Patient Location: Home  Provider Location: Home Office  I discussed the limitations of evaluation and management by telemedicine. The patient expressed understanding and agreed to proceed.  Vital Signs: Because this visit was a virtual/telehealth visit, some criteria may be missing or patient reported. Any vitals not documented were not able to be obtained and vitals that have been documented are patient reported.  VideoDeclined- This patient declined Librarian, academic. Therefore the visit was completed with audio only.  Persons Participating in Visit: Patient.  AWV Questionnaire: No: Patient Medicare AWV questionnaire was not completed prior to this visit.  Cardiac Risk Factors include: advanced age (>81men, >48 women);hypertension;obesity (BMI >30kg/m2);dyslipidemia     Objective:    Today's Vitals   09/10/23 1011  BP: 117/68  Weight: 192 lb (87.1 kg)  Height: 5\' 5"  (1.651 m)   Body mass index is 31.95 kg/m.     09/10/2023   10:10 AM 08/10/2022    9:23 AM 07/31/2022   10:39 AM 10/18/2021    2:43 PM 05/12/2021    9:59  AM 03/04/2021    7:08 AM 04/27/2020    8:14 AM  Advanced Directives  Does Patient Have a Medical Advance Directive? No No No No No No No  Would patient like information on creating a medical advance directive? No - Patient declined No - Patient declined No - Patient declined No - Patient declined Yes (ED - Information included in AVS)  No - Patient declined    Current Medications (verified) Outpatient Encounter Medications as of 09/10/2023  Medication Sig   acetaminophen (TYLENOL) 500 MG tablet Take 1,000 mg by mouth every 6 (six) hours as needed for moderate pain.   Carboxymethylcellul-Glycerin (LUBRICATING EYE DROPS OP) Place 1 drop into both eyes daily as needed (dry eyes).   Coenzyme Q10 (CO Q 10 PO) Take 1 tablet by mouth daily.    diclofenac Sodium (VOLTAREN) 1 % GEL Apply 1 Application topically 4 (four) times daily as needed (pain).   Ergocalciferol (VITAMIN D2) 50 MCG (2000 UT) TABS Take 2,000 Units by mouth daily.   Homeopathic Products (THERAWORX RELIEF EX) Apply 1 Application topically daily as needed (pain).   levocetirizine (XYZAL) 5 MG tablet Take 5 mg by mouth every evening.   lisinopril-hydrochlorothiazide (ZESTORETIC) 20-25 MG tablet Take 1 tablet by mouth once daily   meclizine (ANTIVERT) 12.5 MG tablet Take 1 tablet (12.5 mg total) by mouth 3 (three) times daily as needed for dizziness.   phentermine 15 MG capsule Take one capsule by mouth three days per week , Monday, Wednesday and Friday   phentermine 15 MG capsule Take 1 capsule (15 mg  total) by mouth every morning.   simvastatin (ZOCOR) 20 MG tablet TAKE 1 TABLET BY MOUTH AT BEDTIME   No facility-administered encounter medications on file as of 09/10/2023.    Allergies (verified) Amlodipine, Triamterene, Other, and Penicillins   History: Past Medical History:  Diagnosis Date   Arthritis    C. difficile colitis Jul 18, 2013   Vancomycin. Recheck Cdiff positive. Vancomycin continued for an additional 2 weeks    H/O herpes zoster    Hyperlipidemia    Hypertension    Metabolic syndrome X    Pes anserinus tendinitis 11/22/2011   Prediabetes 2014   lifestyle    Prediabetes    Past Surgical History:  Procedure Laterality Date   ABDOMINAL HYSTERECTOMY  1983 approx   fibroids, partial   ANKLE SURGERY     COLONOSCOPY  04/27/2005   WUJ:[id number] polyp at the splenic flexure cold biopsied/removed. Mid  descending polyp removed with cold snare technique. The remainder of the of the colon looked normal/ Normal rectum. inflammed adenomatous polyps.   COLONOSCOPY N/A 05/26/2013   Dr. Rourk:Multiple colonic polyps removed, tubular adenomas. Needs surveillance Dec 2017.    COLONOSCOPY N/A 05/24/2017   Procedure: COLONOSCOPY;  Surgeon: Corbin Ade, MD;  Location: [facility];  Service: Endoscopy;  Laterality: N/A;  1:00 pm   COLONOSCOPY WITH PROPOFOL N/A 08/02/2022   Procedure: COLONOSCOPY WITH PROPOFOL;  Surgeon: Corbin Ade, MD;  Location: [facility];  Service: Endoscopy;  Laterality: N/A;  1015am, asa 2   FRACTURE SURGERY Right mid 1990's   pin put in and waas removed   POLYPECTOMY  08/02/2022   Procedure: POLYPECTOMY;  Surgeon: Corbin Ade, MD;  Location: [facility];  Service: Endoscopy;;   Family History  Problem Relation Age of Onset   Hypertension Mother    Lung cancer Mother    Hypertension Father    Hypertension Sister    Hypertension Brother    Hypertension Sister    Diabetes Sister    Hypertension Son    Hypertension Son    Colon cancer Neg Hx    Social History   Socioeconomic History   Marital status: Married    Spouse name: Not on file   Number of children: 3   Years of education: 12   Highest education level: 12th grade  Occupational History   Occupation: fulltime - caregiver   Tobacco Use   Smoking status: Never   Smokeless tobacco: Never  Vaping Use   Vaping status: Never Used  Substance and Sexual Activity   Alcohol use: No   Drug use: No   Sexual  activity: Not Currently    Birth control/protection: Surgical  Other Topics Concern   Not on file  Social History Narrative   Lives with husband alone, marriage is strained    Social Drivers of Health   Financial Resource Strain: Low Risk  (09/10/2023)   Overall Financial Resource Strain (CARDIA)    Difficulty of Paying Living Expenses: Not hard at all  Food Insecurity: No Food Insecurity (09/10/2023)   Hunger Vital Sign    Worried About Running Out of Food in the Last Year: Never true    Ran Out of Food in the Last Year: Never true  Transportation Needs: No Transportation Needs (09/10/2023)   PRAPARE - Administrator, Civil Service (Medical): No    Lack of Transportation (Non-Medical): No  Physical Activity: Insufficiently Active (09/10/2023)   Exercise Vital Sign    Days  of Exercise per Week: 7 days    Minutes of Exercise per Session: 20 min  Stress: No Stress Concern Present (09/10/2023)   [employer] of Occupational Health - Occupational Stress Questionnaire    Feeling of Stress : Only a little  Social Connections: Socially Integrated (09/10/2023)   Social Connection and Isolation Panel [NHANES]    Frequency of Communication with Friends and Family: More than three times a week    Frequency of Social Gatherings with Friends and Family: More than three times a week    Attends Religious Services: More than 4 times per year    Active Member of [employer] or Organizations: Yes    Attends Engineer, structural: More than 4 times per year    Marital Status: Married    Tobacco Counseling Counseling given: Not Answered    Clinical Intake:  Pre-visit preparation completed: Yes  Pain : No/denies pain     BMI - recorded: 31.95 Nutritional Status: BMI > 30  Obese Nutritional Risks: None Diabetes: No  Lab Results  Component Value Date   HGBA1C 5.4 11/05/2019   HGBA1C 5.6 05/05/2019   HGBA1C 5.8 (H) 09/30/2018     How often do you need to have someone  help you when you read instructions, pamphlets, or other written materials from your doctor or pharmacy?: 1 - Never  Interpreter Needed?: No  Information entered by :: Estell Harpin   Activities of Daily Living     09/10/2023   10:17 AM  In your present state of health, do you have any difficulty performing the following activities:  Hearing? 0  Vision? 0  Difficulty concentrating or making decisions? 0  Walking or climbing stairs? 0  Dressing or bathing? 0  Doing errands, shopping? 0  Preparing Food and eating ? N  Using the Toilet? N  In the past six months, have you accidently leaked urine? N  Do you have problems with loss of bowel control? N  Managing your Medications? N  Managing your Finances? N  Housekeeping or managing your Housekeeping? N    Patient Care Team: Kerri Perches, MD as PCP - General Rourk, Gerrit Friends, MD as Consulting Physician (Gastroenterology)  Indicate any recent Medical Services you may have received from other than [facility] providers in the past year (date may be approximate).     Assessment:   This is a routine wellness examination for Josiephine.  Hearing/Vision screen Hearing Screening - Comments:: Patient declined Vision Screening - Comments:: Patient wears glasses   Goals Addressed             This Visit's Progress    Patient Stated       Lose a little more weight     Weight (lb) < 200 lb (90.7 kg)   192 lb (87.1 kg)      Depression Screen     09/10/2023   10:18 AM 05/08/2023    8:43 AM 02/06/2023    8:07 AM 09/12/2022    3:00 PM 08/10/2022    9:23 AM 04/06/2022    8:11 AM 11/09/2021    8:08 AM  PHQ 2/9 Scores  PHQ - 2 Score 2 0 0 1 0 0 0  PHQ- 9 Score 4  2 6        Fall Risk     09/10/2023   10:16 AM 05/08/2023    8:43 AM 02/06/2023    8:07 AM 09/12/2022    3:00 PM 08/10/2022    9:23 AM  Fall Risk   Falls in the past year? 0 0 0 0 0  Number falls in past yr: 0 0 0 0 0  Injury with Fall? 0 0 0 0 0  Risk for fall  due to : No Fall Risks No Fall Risks No Fall Risks No Fall Risks No Fall Risks  Follow up Falls prevention discussed;Falls evaluation completed Falls evaluation completed Falls evaluation completed Falls evaluation completed Falls evaluation completed    MEDICARE RISK AT HOME:  Medicare Risk at Home Any stairs in or around the home?: No If so, are there any without handrails?: No Home free of loose throw rugs in walkways, pet beds, electrical cords, etc?: Yes Adequate lighting in your home to reduce risk of falls?: Yes Life alert?: No Use of a cane, walker or w/c?: No Grab bars in the bathroom?: Yes Shower chair or bench in shower?: No Elevated toilet seat or a handicapped toilet?: No  TIMED UP AND GO:  Was the test performed?  No  Cognitive Function: 6CIT completed    08/10/2022    9:24 AM  MMSE - Mini Mental State Exam  Not completed: Unable to complete        09/10/2023   10:13 AM 08/10/2022    9:24 AM 04/27/2020    8:17 AM 04/24/2019    8:13 AM 04/22/2018    3:42 PM  6CIT Screen  What Year?   0 points 0 points 0 points  What month? 0 points 0 points 0 points 0 points 0 points  What time? 0 points 0 points 0 points 0 points 3 points  Count back from 20 0 points 0 points 0 points 0 points 0 points  Months in reverse 0 points 0 points 0 points 0 points 0 points  Repeat phrase 0 points 0 points 0 points 0 points 0 points  Total Score   0 points 0 points 3 points    Immunizations Immunization History  Administered Date(s) Administered   Fluad Quad(high Dose 65+) 02/11/2019, 03/01/2021, 03/16/2022, 03/07/2023   H1N1 04/17/2008   Influenza Whole 03/29/2006, 07/30/2008, 03/26/2009   Influenza,inj,Quad PF,6+ Mos 02/20/2013, 03/23/2014, 04/07/2015, 03/21/2016, 02/13/2017, 02/14/2018   Influenza-Unspecified 03/17/2020   Moderna Covid-19 Vaccine Bivalent Booster 53yrs & up 03/07/2023   Moderna SARS-COV2 Booster Vaccination 05/01/2020, 12/24/2020   Moderna Sars-Covid-2  Vaccination 07/17/2019, 08/18/2019, 05/01/2021   Pneumococcal Conjugate-13 07/16/2014   Pneumococcal Polysaccharide-23 02/20/2013   Td 04/27/2004   Varicella 02/22/2013   Zoster Recombinant(Shingrix) 08/01/2021, 03/01/2022    Screening Tests Health Maintenance  Topic Date Due   DTaP/Tdap/Td (2 - Tdap) 04/27/2014   COVID-19 Vaccine (7 - 2024-25 season) 05/02/2023   Medicare Annual Wellness (AWV)  09/09/2024   Colonoscopy  08/02/2025   Pneumonia Vaccine 40+ Years old  Completed   INFLUENZA VACCINE  Completed   DEXA SCAN  Completed   Hepatitis C Screening  Completed   Zoster Vaccines- Shingrix  Completed   HPV VACCINES  Aged Out    Health Maintenance  Health Maintenance Due  Topic Date Due   DTaP/Tdap/Td (2 - Tdap) 04/27/2014   COVID-19 Vaccine (7 - 2024-25 season) 05/02/2023   Health Maintenance Items Addressed:patient declined   Additional Screening:  Vision Screening: Recommended annual ophthalmology exams for early detection of glaucoma and other disorders of the eye.  Dental Screening: Recommended annual dental exams for proper oral hygiene  Community Resource Referral / Chronic Care Management: CRR required this visit?  No   CCM required this visit?  No     Plan:     I have personally reviewed and noted the following in the patient's chart:   Medical and social history Use of alcohol, tobacco or illicit drugs  Current medications and supplements including opioid prescriptions. Patient is not currently taking opioid prescriptions. Functional ability and status Nutritional status Physical activity Advanced directives List of other physicians Hospitalizations, surgeries, and ER visits in previous 12 months Vitals Screenings to include cognitive, depression, and falls Referrals and appointments  In addition, I have reviewed and discussed with patient certain preventive protocols, quality metrics, and best practice recommendations. A written personalized  care plan for preventive services as well as general preventive health recommendations were provided to patient.     Rudi Heap, [place]   09/10/2023   After Visit Summary: (MyChart) Due to this being a telephonic visit, the after visit summary with patients personalized plan was offered to patient via MyChart   Notes: Nothing significant to report at this time.

## 2023-09-10 NOTE — Patient Instructions (Signed)
 Karen [name] , Thank you for taking time to come for your Medicare Wellness Visit. I appreciate your ongoing commitment to your health goals. Please review the following plan we discussed and let me know if I can assist you in the future.   Referrals/Orders/Follow-Ups/Clinician Recommendations:   This is a list of the screening recommended for you and due dates:  Health Maintenance  Topic Date Due   DTaP/Tdap/Td vaccine (2 - Tdap) 04/27/2014   COVID-19 Vaccine (7 - 2024-25 season) 05/02/2023   Medicare Annual Wellness Visit  09/09/2024   Colon Cancer Screening  08/02/2025   Pneumonia Vaccine  Completed   Flu Shot  Completed   DEXA scan (bone density measurement)  Completed   Hepatitis C Screening  Completed   Zoster (Shingles) Vaccine  Completed   HPV Vaccine  Aged Out    Advanced directives: (Declined) Advance directive discussed with you today. Even though you declined this today, please call our office should you change your mind, and we can give you the proper paperwork for you to fill out.  Next Medicare Annual Wellness Visit scheduled for next year: Yes

## 2023-09-19 ENCOUNTER — Ambulatory Visit (INDEPENDENT_AMBULATORY_CARE_PROVIDER_SITE_OTHER): Admitting: Family Medicine

## 2023-09-19 VITALS — BP 128/72 | HR 76 | Resp 16 | Ht 66.0 in | Wt 194.0 lb

## 2023-09-19 DIAGNOSIS — B369 Superficial mycosis, unspecified: Secondary | ICD-10-CM

## 2023-09-19 DIAGNOSIS — M1711 Unilateral primary osteoarthritis, right knee: Secondary | ICD-10-CM

## 2023-09-19 DIAGNOSIS — E66811 Obesity, class 1: Secondary | ICD-10-CM

## 2023-09-19 DIAGNOSIS — M5410 Radiculopathy, site unspecified: Secondary | ICD-10-CM | POA: Diagnosis not present

## 2023-09-19 DIAGNOSIS — E785 Hyperlipidemia, unspecified: Secondary | ICD-10-CM

## 2023-09-19 DIAGNOSIS — I10 Essential (primary) hypertension: Secondary | ICD-10-CM | POA: Diagnosis not present

## 2023-09-19 DIAGNOSIS — Z1231 Encounter for screening mammogram for malignant neoplasm of breast: Secondary | ICD-10-CM | POA: Diagnosis not present

## 2023-09-19 MED ORDER — CLOTRIMAZOLE-BETAMETHASONE 1-0.05 % EX CREA: 1.0000 | TOPICAL_CREAM | Freq: Two times a day (BID) | CUTANEOUS | 1 refills | Status: AC

## 2023-09-19 MED ORDER — TIZANIDINE HCL 4 MG PO TABS: ORAL_TABLET | ORAL | 0 refills | Status: DC

## 2023-09-19 MED ORDER — PHENTERMINE HCL 15 MG PO CAPS: ORAL_CAPSULE | ORAL | 0 refills | Status: DC

## 2023-09-19 NOTE — Progress Notes (Signed)
 Copy of visit created due to coding query requested with appropriate Provider for attestation. Because this visit was a virtual/telehealth visit,  certain criteria was not obtained, such a blood pressure, CBG if applicable, and timed get up and go. Any medications not marked as "taking" were not mentioned during the medication reconciliation part of the visit. Any vitals not documented were not able to be obtained due to this being a telehealth visit or patient was unable to self-report a recent blood pressure reading due to a lack of equipment at home via telehealth. Vitals that have been documented are verbally provided by the patient.   Subjective:   Karen [name] is a 78 y.o. who presents for a Medicare Wellness preventive visit.  Visit Complete: Virtual I connected with  Karen [name] on 09/19/23 by a audio enabled telemedicine application and verified that I am speaking with the correct person using two identifiers.  Patient Location: Home  Provider Location: Home Office  I discussed the limitations of evaluation and management by telemedicine. The patient expressed understanding and agreed to proceed.  Vital Signs: Because this visit was a virtual/telehealth visit, some criteria may be missing or patient reported. Any vitals not documented were not able to be obtained and vitals that have been documented are patient reported.  VideoDeclined- This patient declined Librarian, academic. Therefore the visit was completed with audio only.  Persons Participating in Visit: Patient.  AWV Questionnaire: No: Patient Medicare AWV questionnaire was not completed prior to this visit.  Cardiac Risk Factors include: advanced age (>64men, >59 women);hypertension;obesity (BMI >30kg/m2);dyslipidemia     Objective:    Today's Vitals   09/10/23 1011  BP: 117/68  Weight: 192 lb (87.1 kg)  Height: 5\' 5"  (1.651 m)   Body mass index is 31.95 kg/m.     09/10/2023    10:10 AM 08/10/2022    9:23 AM 07/31/2022   10:39 AM 10/18/2021    2:43 PM 05/12/2021    9:59 AM 03/04/2021    7:08 AM 04/27/2020    8:14 AM  Advanced Directives  Does Patient Have a Medical Advance Directive? No No No No No No No  Would patient like information on creating a medical advance directive? No - Patient declined No - Patient declined No - Patient declined No - Patient declined Yes (ED - Information included in AVS)  No - Patient declined    Current Medications (verified) Outpatient Encounter Medications as of 09/10/2023  Medication Sig   acetaminophen (TYLENOL) 500 MG tablet Take 1,000 mg by mouth every 6 (six) hours as needed for moderate pain.   Carboxymethylcellul-Glycerin (LUBRICATING EYE DROPS OP) Place 1 drop into both eyes daily as needed (dry eyes).   Coenzyme Q10 (CO Q 10 PO) Take 1 tablet by mouth daily.    diclofenac Sodium (VOLTAREN) 1 % GEL Apply 1 Application topically 4 (four) times daily as needed (pain).   Ergocalciferol (VITAMIN D2) 50 MCG (2000 UT) TABS Take 2,000 Units by mouth daily.   Homeopathic Products (THERAWORX RELIEF EX) Apply 1 Application topically daily as needed (pain).   levocetirizine (XYZAL) 5 MG tablet Take 5 mg by mouth every evening.   lisinopril-hydrochlorothiazide (ZESTORETIC) 20-25 MG tablet Take 1 tablet by mouth once daily   meclizine (ANTIVERT) 12.5 MG tablet Take 1 tablet (12.5 mg total) by mouth 3 (three) times daily as needed for dizziness.   phentermine 15 MG capsule Take one capsule by mouth three days per week , Monday,  Wednesday and Friday   phentermine 15 MG capsule Take 1 capsule (15 mg total) by mouth every morning.   simvastatin (ZOCOR) 20 MG tablet TAKE 1 TABLET BY MOUTH AT BEDTIME   No facility-administered encounter medications on file as of 09/10/2023.    Allergies (verified) Amlodipine, Triamterene, Other, and Penicillins   History: Past Medical History:  Diagnosis Date   Arthritis    C. difficile colitis Jul 18, 2013   Vancomycin. Recheck Cdiff positive. Vancomycin continued for an additional 2 weeks   H/O herpes zoster    Hyperlipidemia    Hypertension    Metabolic syndrome X    Pes anserinus tendinitis 11/22/2011   Prediabetes 2014   lifestyle    Prediabetes    Past Surgical History:  Procedure Laterality Date   ABDOMINAL HYSTERECTOMY  1983 approx   fibroids, partial   ANKLE SURGERY     COLONOSCOPY  04/27/2005   EAV:[id number] polyp at the splenic flexure cold biopsied/removed. Mid  descending polyp removed with cold snare technique. The remainder of the of the colon looked normal/ Normal rectum. inflammed adenomatous polyps.   COLONOSCOPY N/A 05/26/2013   Dr. Rourk:Multiple colonic polyps removed, tubular adenomas. Needs surveillance Dec 2017.    COLONOSCOPY N/A 05/24/2017   Procedure: COLONOSCOPY;  Surgeon: Corbin Ade, MD;  Location: [facility];  Service: Endoscopy;  Laterality: N/A;  1:00 pm   COLONOSCOPY WITH PROPOFOL N/A 08/02/2022   Procedure: COLONOSCOPY WITH PROPOFOL;  Surgeon: Corbin Ade, MD;  Location: [facility];  Service: Endoscopy;  Laterality: N/A;  1015am, asa 2   FRACTURE SURGERY Right mid 1990's   pin put in and waas removed   POLYPECTOMY  08/02/2022   Procedure: POLYPECTOMY;  Surgeon: Corbin Ade, MD;  Location: [facility];  Service: Endoscopy;;   Family History  Problem Relation Age of Onset   Hypertension Mother    Lung cancer Mother    Hypertension Father    Hypertension Sister    Hypertension Brother    Hypertension Sister    Diabetes Sister    Hypertension Son    Hypertension Son    Colon cancer Neg Hx    Social History   Socioeconomic History   Marital status: Married    Spouse name: Not on file   Number of children: 3   Years of education: 12   Highest education level: 12th grade  Occupational History   Occupation: fulltime - caregiver   Tobacco Use   Smoking status: Never   Smokeless tobacco: Never  Vaping Use   Vaping  status: Never Used  Substance and Sexual Activity   Alcohol use: No   Drug use: No   Sexual activity: Not Currently    Birth control/protection: Surgical  Other Topics Concern   Not on file  Social History Narrative   Lives with husband alone, marriage is strained    Social Drivers of Health   Financial Resource Strain: Low Risk  (09/10/2023)   Overall Financial Resource Strain (CARDIA)    Difficulty of Paying Living Expenses: Not hard at all  Food Insecurity: No Food Insecurity (09/10/2023)   Hunger Vital Sign    Worried About Running Out of Food in the Last Year: Never true    Ran Out of Food in the Last Year: Never true  Transportation Needs: No Transportation Needs (09/10/2023)   PRAPARE - Administrator, Civil Service (Medical): No    Lack of Transportation (Non-Medical): No  Physical Activity: Insufficiently Active (09/10/2023)   Exercise Vital Sign    Days of Exercise per Week: 7 days    Minutes of Exercise per Session: 20 min  Stress: No Stress Concern Present (09/10/2023)   [employer] of Occupational Health - Occupational Stress Questionnaire    Feeling of Stress : Only a little  Social Connections: Socially Integrated (09/10/2023)   Social Connection and Isolation Panel [NHANES]    Frequency of Communication with Friends and Family: More than three times a week    Frequency of Social Gatherings with Friends and Family: More than three times a week    Attends Religious Services: More than 4 times per year    Active Member of [employer] or Organizations: Yes    Attends Engineer, structural: More than 4 times per year    Marital Status: Married    Tobacco Counseling Counseling given: Not Answered    Clinical Intake:  Pre-visit preparation completed: Yes  Pain : No/denies pain     BMI - recorded: 31.95 Nutritional Status: BMI > 30  Obese Nutritional Risks: None Diabetes: No  Lab Results  Component Value Date   HGBA1C 5.4 11/05/2019    HGBA1C 5.6 05/05/2019   HGBA1C 5.8 (H) 09/30/2018     How often do you need to have someone help you when you read instructions, pamphlets, or other written materials from your doctor or pharmacy?: 1 - Never  Interpreter Needed?: No  Information entered by :: Estell Harpin   Activities of Daily Living     09/10/2023   10:17 AM  In your present state of health, do you have any difficulty performing the following activities:  Hearing? 0  Vision? 0  Difficulty concentrating or making decisions? 0  Walking or climbing stairs? 0  Dressing or bathing? 0  Doing errands, shopping? 0  Preparing Food and eating ? N  Using the Toilet? N  In the past six months, have you accidently leaked urine? N  Do you have problems with loss of bowel control? N  Managing your Medications? N  Managing your Finances? N  Housekeeping or managing your Housekeeping? N    Patient Care Team: Kerri Perches, MD as PCP - General Rourk, Gerrit Friends, MD as Consulting Physician (Gastroenterology)  Indicate any recent Medical Services you may have received from other than [facility] providers in the past year (date may be approximate).     Assessment:   This is a routine wellness examination for Karen [name].  Hearing/Vision screen Hearing Screening - Comments:: Patient declined Vision Screening - Comments:: Patient wears glasses   Goals Addressed             This Visit's Progress    Patient Stated       Lose a little more weight     Weight (lb) < 200 lb (90.7 kg)         Depression Screen     09/19/2023    9:42 AM 09/10/2023   10:18 AM 05/08/2023    8:43 AM 02/06/2023    8:07 AM 09/12/2022    3:00 PM 08/10/2022    9:23 AM 04/06/2022    8:11 AM  PHQ 2/9 Scores  PHQ - 2 Score 1 2 0 0 1 0 0  PHQ- 9 Score 3 4  2 6       Fall Risk     09/19/2023    9:42 AM 09/10/2023   10:16 AM 05/08/2023    8:43 AM 02/06/2023  8:07 AM 09/12/2022    3:00 PM  Fall Risk   Falls in the past year? 0 0 0 0 0   Number falls in past yr: 0 0 0 0 0  Injury with Fall? 0 0 0 0 0  Risk for fall due to :  No Fall Risks No Fall Risks No Fall Risks No Fall Risks  Follow up Falls evaluation completed Falls prevention discussed;Falls evaluation completed Falls evaluation completed Falls evaluation completed Falls evaluation completed    MEDICARE RISK AT HOME:  Medicare Risk at Home Any stairs in or around the home?: No If so, are there any without handrails?: No Home free of loose throw rugs in walkways, pet beds, electrical cords, etc?: Yes Adequate lighting in your home to reduce risk of falls?: Yes Life alert?: No Use of a cane, walker or w/c?: No Grab bars in the bathroom?: Yes Shower chair or bench in shower?: No Elevated toilet seat or a handicapped toilet?: No  TIMED UP AND GO:  Was the test performed?  No  Cognitive Function: 6CIT completed    08/10/2022    9:24 AM  MMSE - Mini Mental State Exam  Not completed: Unable to complete        09/10/2023   10:13 AM 08/10/2022    9:24 AM 04/27/2020    8:17 AM 04/24/2019    8:13 AM 04/22/2018    3:42 PM  6CIT Screen  What Year?   0 points 0 points 0 points  What month? 0 points 0 points 0 points 0 points 0 points  What time? 0 points 0 points 0 points 0 points 3 points  Count back from 20 0 points 0 points 0 points 0 points 0 points  Months in reverse 0 points 0 points 0 points 0 points 0 points  Repeat phrase 0 points 0 points 0 points 0 points 0 points  Total Score   0 points 0 points 3 points    Immunizations Immunization History  Administered Date(s) Administered   Fluad Quad(high Dose 65+) 02/11/2019, 03/01/2021, 03/16/2022, 03/07/2023   H1N1 04/17/2008   Influenza Whole 03/29/2006, 07/30/2008, 03/26/2009   Influenza,inj,Quad PF,6+ Mos 02/20/2013, 03/23/2014, 04/07/2015, 03/21/2016, 02/13/2017, 02/14/2018   Influenza-Unspecified 03/17/2020   Moderna Covid-19 Vaccine Bivalent Booster 54yrs & up 03/07/2023   Moderna SARS-COV2  Booster Vaccination 05/01/2020, 12/24/2020   Moderna Sars-Covid-2 Vaccination 07/17/2019, 08/18/2019, 05/01/2021   Pneumococcal Conjugate-13 07/16/2014   Pneumococcal Polysaccharide-23 02/20/2013   Td 04/27/2004   Varicella 02/22/2013   Zoster Recombinant(Shingrix) 08/01/2021, 03/01/2022    Screening Tests Health Maintenance  Topic Date Due   DTaP/Tdap/Td (2 - Tdap) 04/27/2014   COVID-19 Vaccine (7 - 2024-25 season) 05/02/2023   INFLUENZA VACCINE  01/11/2024   Medicare Annual Wellness (AWV)  09/09/2024   Colonoscopy  08/02/2025   Pneumonia Vaccine 7+ Years old  Completed   DEXA SCAN  Completed   Hepatitis C Screening  Completed   Zoster Vaccines- Shingrix  Completed   HPV VACCINES  Aged Out    Health Maintenance  Health Maintenance Due  Topic Date Due   DTaP/Tdap/Td (2 - Tdap) 04/27/2014   COVID-19 Vaccine (7 - 2024-25 season) 05/02/2023   Health Maintenance Items Addressed:patient declined   Additional Screening:  Vision Screening: Recommended annual ophthalmology exams for early detection of glaucoma and other disorders of the eye.  Dental Screening: Recommended annual dental exams for proper oral hygiene  Community Resource Referral / Chronic Care Management: CRR required this visit?  No  CCM required this visit?  No     Plan:     I have personally reviewed and noted the following in the patient's chart:   Medical and social history Use of alcohol, tobacco or illicit drugs  Current medications and supplements including opioid prescriptions. Patient is not currently taking opioid prescriptions. Functional ability and status Nutritional status Physical activity Advanced directives List of other physicians Hospitalizations, surgeries, and ER visits in previous 12 months Vitals Screenings to include cognitive, depression, and falls Referrals and appointments  In addition, I have reviewed and discussed with patient certain preventive protocols, quality  metrics, and best practice recommendations. A written personalized care plan for preventive services as well as general preventive health recommendations were provided to patient.     Rudi Heap, [place]   09/19/2023   After Visit Summary: (MyChart) Due to this being a telephonic visit, the after visit summary with patients personalized plan was offered to patient via MyChart   Notes: Nothing significant to report at this time.

## 2023-09-19 NOTE — Patient Instructions (Addendum)
 F/u in 4 months, call if you need me sooner  Three meds, Zanaflex, clotrimazol/betmethasone cream and phentermine to take four times weekly no refill due before May 28 on the phentermine as we discussed, have all been sent in  Cornstarch is a very good " drying agent to use  on the skin  Aim for 6 to 8 pound weight loss  Food and beverage choice is key  Pls schedule mammogram at checkout   Thanks for choosing [facility], we consider it a privelige to serve you.

## 2023-09-21 ENCOUNTER — Encounter: Payer: Self-pay | Admitting: Family Medicine

## 2023-09-21 DIAGNOSIS — B369 Superficial mycosis, unspecified: Secondary | ICD-10-CM | POA: Insufficient documentation

## 2023-09-21 NOTE — Assessment & Plan Note (Signed)
 Controlled, no change in medication DASH diet and commitment to daily physical activity for a minimum of 30 minutes discussed and encouraged, as a part of hypertension management. The importance of attaining a healthy weight is also discussed.     09/19/2023    9:40 AM 09/10/2023   10:11 AM 05/08/2023    9:39 AM 05/08/2023    8:42 AM 02/06/2023    8:06 AM 09/25/2022    4:25 PM 09/12/2022    2:59 PM  BP/Weight  Systolic BP 128 117 108 100 125 148 127  Diastolic BP 72 68 80 63 71 62 76  Wt. (Lbs) 194 192  192.04 212.04 207.4 202  BMI 31.31 kg/m2 31.95 kg/m2  31.96 kg/m2 35.29 kg/m2 33.99 kg/m2 32.6 kg/m2     '

## 2023-09-21 NOTE — Assessment & Plan Note (Signed)
  Patient re-educated about  the importance of commitment to a  minimum of 150 minutes of exercise per week as able.  The importance of healthy food choices with portion control discussed, as well as eating regularly and within a 12 hour window most days. The need to choose "clean , green" food 50 to 75% of the time is discussed, as well as to make water the primary drink and set a goal of 64 ounces water daily.       09/19/2023    9:40 AM 09/10/2023   10:11 AM 05/08/2023    8:42 AM  Weight /BMI  Weight 194 lb 192 lb 192 lb 0.6 oz  Height 5\' 6"  (1.676 m) 5\' 5"  (1.651 m) 5\' 5"  (1.651 m)  BMI 31.31 kg/m2 31.95 kg/m2 31.96 kg/m2    Unchanged, reduce phentermine to 4 times weekly, focus on food choice

## 2023-09-21 NOTE — Assessment & Plan Note (Signed)
Clotrimazole/betamethasone  prescribed for as needed use

## 2023-09-21 NOTE — Assessment & Plan Note (Signed)
 Hyperlipidemia:Low fat diet discussed and encouraged.   Lipid Panel  Lab Results  Component Value Date   CHOL 173 05/02/2023   HDL 54 05/02/2023   LDLCALC 105 (H) 05/02/2023   TRIG 75 05/02/2023   CHOLHDL 3.2 05/02/2023     Controlled, no change in medication Need sto lower fat intake

## 2023-09-21 NOTE — Assessment & Plan Note (Signed)
 Pain and intermittent instability holding off on surgery, handicap placard application provided

## 2023-09-21 NOTE — Progress Notes (Signed)
 Karen [name]     MRN: [id number]      DOB: 05-12-1946  Chief Complaint  Patient presents with   Hypertension    Follow up    Spasms    Complains of muscle tightness/spasms for the last couples weeks. States it increases when standing for long periods of time. Would like to discuss a muscle relaxer prn     HPI Karen [name] is here for follow up and re-evaluation of chronic medical conditions, medication management and review of any available recent lab and radiology data.  Preventive health is updated, specifically  Cancer screening and Immunization.   Questions or concerns regarding consultations or procedures which the PT has had in the interim are  addressed. The PT denies any adverse reactions to current medications since the last visit.  Concerns are as above.  She is also requested and a backpack instead because of knee pain with instability as well as arthritis in her lower back.  States she feels much better with the weight she has lost and would like to continue phentermine for a few more months with the goal of losing an additional 10 to 12 pounds in the next year.    ROS Denies recent fever or chills. Denies sinus pressure, nasal congestion, ear pain or sore throat. Denies chest congestion, productive cough or wheezing. Denies chest pains, palpitations and leg swelling Denies abdominal pain, nausea, vomiting,diarrhea or constipation.   Denies dysuria, frequency, hesitancy or incontinence.  Denies headaches, seizures, numbness, or tingling. Denies depression, anxiety or insomnia. C/o lower abdominal rash which tches where skin overhangs PE  BP 128/72   Pulse 76   Resp 16   Ht 5\' 6"  (1.676 m)   Wt 194 lb (88 kg)   SpO2 96%   BMI 31.31 kg/m   Patient alert and oriented and in no cardiopulmonary distress.  HEENT: No facial asymmetry, EOMI,     Neck supple .  Chest: Clear to auscultation bilaterally.  CVS: S1, S2 no murmurs, no S3.Regular rate.  ABD: Soft  non tender.   Ext: No edema  NW:GNFAOZHYQ ROM lumbar spine, and knees.  Skin: Intact, no ulcerations or rash noted.  Psych: Good eye contact, normal affect. Memory intact not anxious or depressed appearing.  CNS: CN 2-12 intact, power,  normal throughout.no focal deficits noted.   Assessment & Plan  Osteoarthritis of knee Pain and intermittent instability holding off on surgery, handicap placard application provided  Hyperlipidemia LDL goal <100 Hyperlipidemia:Low fat diet discussed and encouraged.   Lipid Panel  Lab Results  Component Value Date   CHOL 173 05/02/2023   HDL 54 05/02/2023   LDLCALC 105 (H) 05/02/2023   TRIG 75 05/02/2023   CHOLHDL 3.2 05/02/2023     Controlled, no change in medication Need sto lower fat intake  Essential hypertension, benign Controlled, no change in medication DASH diet and commitment to daily physical activity for a minimum of 30 minutes discussed and encouraged, as a part of hypertension management. The importance of attaining a healthy weight is also discussed.     09/19/2023    9:40 AM 09/10/2023   10:11 AM 05/08/2023    9:39 AM 05/08/2023    8:42 AM 02/06/2023    8:06 AM 09/25/2022    4:25 PM 09/12/2022    2:59 PM  BP/Weight  Systolic BP 128 117 108 100 125 148 127  Diastolic BP 72 68 80 63 71 62 76  Wt. (Lbs) 194 192  192.04  212.04 207.4 202  BMI 31.31 kg/m2 31.95 kg/m2  31.96 kg/m2 35.29 kg/m2 33.99 kg/m2 32.6 kg/m2     '  Back pain with right-sided radiculopathy Intermittent flares none currrently, zannaflex prescribed for as needed use  Obesity (BMI 30.0-34.9)  Patient re-educated about  the importance of commitment to a  minimum of 150 minutes of exercise per week as able.  The importance of healthy food choices with portion control discussed, as well as eating regularly and within a 12 hour window most days. The need to choose "clean , green" food 50 to 75% of the time is discussed, as well as to make water the  primary drink and set a goal of 64 ounces water daily.       09/19/2023    9:40 AM 09/10/2023   10:11 AM 05/08/2023    8:42 AM  Weight /BMI  Weight 194 lb 192 lb 192 lb 0.6 oz  Height 5\' 6"  (1.676 m) 5\' 5"  (1.651 m) 5\' 5"  (1.651 m)  BMI 31.31 kg/m2 31.95 kg/m2 31.96 kg/m2    Unchanged, reduce phentermine to 4 times weekly, focus on food choice  Dermatomycosis Clotrimazole/betamethasone prescribed for as needed use

## 2023-09-21 NOTE — Assessment & Plan Note (Addendum)
 Intermittent flares none currrently, zannaflex prescribed for as needed use

## 2023-09-26 ENCOUNTER — Other Ambulatory Visit: Payer: Self-pay | Admitting: Family Medicine

## 2023-11-14 ENCOUNTER — Other Ambulatory Visit: Payer: Self-pay | Admitting: Family Medicine

## 2023-11-19 ENCOUNTER — Other Ambulatory Visit: Payer: Self-pay | Admitting: Family Medicine

## 2023-12-26 ENCOUNTER — Other Ambulatory Visit: Payer: Self-pay | Admitting: Family Medicine

## 2024-01-04 ENCOUNTER — Encounter (HOSPITAL_COMMUNITY): Payer: Self-pay

## 2024-01-04 ENCOUNTER — Ambulatory Visit (HOSPITAL_COMMUNITY)
Admission: RE | Admit: 2024-01-04 | Discharge: 2024-01-04 | Disposition: A | Discharge status: Home / Self Care | Source: Ambulatory Visit | Attending: Family Medicine | Admitting: Family Medicine

## 2024-01-04 DIAGNOSIS — Z1231 Encounter for screening mammogram for malignant neoplasm of breast: Secondary | ICD-10-CM | POA: Insufficient documentation

## 2024-01-15 ENCOUNTER — Telehealth: Payer: Self-pay | Admitting: Family Medicine

## 2024-01-15 NOTE — Telephone Encounter (Unsigned)
 Copied from CRM #[id number]. Topic: Clinical - Request for Lab/Test Order >> Jan 15, 2024  4:25 PM Donee H wrote: Reason for CRM: Patient called stating she has an upcoming 4 month follow up visit with Dr. Antonetta. Patient states usually she has labs done prior to office visit. Patient requesting for labs to be placed and would like to get them done by Thursday so the results will be back before appointment.

## 2024-01-16 ENCOUNTER — Other Ambulatory Visit: Payer: Self-pay

## 2024-01-16 DIAGNOSIS — I10 Essential (primary) hypertension: Secondary | ICD-10-CM

## 2024-01-16 DIAGNOSIS — E785 Hyperlipidemia, unspecified: Secondary | ICD-10-CM

## 2024-01-16 NOTE — Telephone Encounter (Signed)
 Ordered. Lvm informing

## 2024-01-16 NOTE — Telephone Encounter (Signed)
 Patient returned call. Informed patient orders have been placed. Patient verbalized understanding.

## 2024-01-17 DIAGNOSIS — I10 Essential (primary) hypertension: Secondary | ICD-10-CM | POA: Diagnosis not present

## 2024-01-17 DIAGNOSIS — E785 Hyperlipidemia, unspecified: Secondary | ICD-10-CM | POA: Diagnosis not present

## 2024-01-18 LAB — CMP14+EGFR
ALT: 18 IU/L (ref 0–32)
AST: 24 IU/L (ref 0–40)
Albumin: 4.3 g/dL (ref 3.8–4.8)
Alkaline Phosphatase: 78 IU/L (ref 44–121)
BUN/Creatinine Ratio: 26 (ref 12–28)
BUN: 24 mg/dL (ref 8–27)
Bilirubin Total: 0.4 mg/dL (ref 0.0–1.2)
CO2: 21 mmol/L (ref 20–29)
Calcium: 10.5 mg/dL — ABNORMAL HIGH (ref 8.7–10.3)
Chloride: 102 mmol/L (ref 96–106)
Creatinine, Ser: 0.92 mg/dL (ref 0.57–1.00)
Globulin, Total: 2.5 g/dL (ref 1.5–4.5)
Glucose: 89 mg/dL (ref 70–99)
Potassium: 4.3 mmol/L (ref 3.5–5.2)
Sodium: 137 mmol/L (ref 134–144)
Total Protein: 6.8 g/dL (ref 6.0–8.5)
eGFR: 65 mL/min/1.73 (ref >59)

## 2024-01-18 LAB — LIPID PANEL
Chol/HDL Ratio: 3.6 ratio (ref 0.0–4.4)
Cholesterol, Total: 199 mg/dL (ref 100–199)
HDL: 55 mg/dL (ref >39)
LDL Chol Calc (NIH): 130 mg/dL — ABNORMAL HIGH (ref 0–99)
Triglycerides: 77 mg/dL (ref 0–149)
VLDL Cholesterol Cal: 14 mg/dL (ref 5–40)

## 2024-01-22 ENCOUNTER — Other Ambulatory Visit: Payer: Self-pay | Admitting: Family Medicine

## 2024-01-22 ENCOUNTER — Encounter: Payer: Self-pay | Admitting: Family Medicine

## 2024-01-22 ENCOUNTER — Ambulatory Visit (INDEPENDENT_AMBULATORY_CARE_PROVIDER_SITE_OTHER): Admitting: Family Medicine

## 2024-01-22 VITALS — BP 137/72 | HR 85 | Resp 18 | Ht 66.0 in | Wt 195.0 lb

## 2024-01-22 DIAGNOSIS — E66811 Obesity, class 1: Secondary | ICD-10-CM

## 2024-01-22 DIAGNOSIS — E785 Hyperlipidemia, unspecified: Secondary | ICD-10-CM

## 2024-01-22 DIAGNOSIS — M5410 Radiculopathy, site unspecified: Secondary | ICD-10-CM | POA: Diagnosis not present

## 2024-01-22 DIAGNOSIS — I10 Essential (primary) hypertension: Secondary | ICD-10-CM | POA: Diagnosis not present

## 2024-01-22 DIAGNOSIS — J302 Other seasonal allergic rhinitis: Secondary | ICD-10-CM | POA: Diagnosis not present

## 2024-01-22 DIAGNOSIS — E559 Vitamin D deficiency, unspecified: Secondary | ICD-10-CM | POA: Diagnosis not present

## 2024-01-22 DIAGNOSIS — I83893 Varicose veins of bilateral lower extremities with other complications: Secondary | ICD-10-CM

## 2024-01-22 DIAGNOSIS — F419 Anxiety disorder, unspecified: Secondary | ICD-10-CM

## 2024-01-22 MED ORDER — PHENTERMINE HCL 15 MG PO CAPS: ORAL_CAPSULE | ORAL | 1 refills | Status: AC

## 2024-01-22 NOTE — Progress Notes (Signed)
 Karen [name]     MRN: [id number]      DOB: 02/23/1946  Chief Complaint  Patient presents with   Hypertension    4 month follow up     HPI Karen [name] is here for follow up and re-evaluation of chronic medical conditions, medication management and review of any available recent lab and radiology data.  Preventive health is updated, specifically  Cancer screening and Immunization.   Questions or concerns regarding consultations or procedures which the PT has had in the interim are  addressed. The PT denies any adverse reactions to current medications since the last visit.  Increased anxiety and stress ROS Denies recent fever or chills. Denies sinus pressure, nasal congestion, ear pain or sore throat. Denies chest congestion, productive cough or wheezing. Denies chest pains, palpitations and leg swelling Denies abdominal pain, nausea, vomiting,diarrhea or constipation.   Denies dysuria, frequency, hesitancy or incontinence. Denies joint pain, swelling and limitation in mobility. Denies headaches, seizures, numbness, or tingling.  Denies skin break down or rash.   PE  BP 137/72   Pulse 85   Resp 18   Ht 5' 6 (1.676 m)   Wt 195 lb 0.6 oz (88.5 kg)   SpO2 96%   BMI 31.48 kg/m   Patient alert and oriented and in no cardiopulmonary distress.  HEENT: No facial asymmetry, EOMI,     Neck supple .  Chest: Clear to auscultation bilaterally.  CVS: S1, S2 no murmurs, no S3.Regular rate.  ABD: Soft non tender.   Ext: No edema  MS: Adequate ROM spine, shoulders, hips and knees.  Skin: Intact, no ulcerations or rash noted.  Psych: Good eye contact, normal affect. Memory intact not anxious or depressed appearing.  CNS: CN 2-12 intact, power,  normal throughout.no focal deficits noted.   Assessment & Plan  Varicose veins of both lower extremities Needs compression hose same prescribed  Hyperlipidemia LDL goal <100 Hyperlipidemia:Low fat diet discussed and  encouraged.   Lipid Panel  Lab Results  Component Value Date   CHOL 199 01/17/2024   HDL 55 01/17/2024   LDLCALC 130 (H) 01/17/2024   TRIG 77 01/17/2024   CHOLHDL 3.6 01/17/2024     Needs to reduce fat in diet and take med regularly as prescribed  Essential hypertension, benign Controlled, no change in medication DASH diet and commitment to daily physical activity for a minimum of 30 minutes discussed and encouraged, as a part of hypertension management. The importance of attaining a healthy weight is also discussed.     01/22/2024    8:04 AM 09/19/2023    9:40 AM 09/10/2023   10:11 AM 05/08/2023    9:39 AM 05/08/2023    8:42 AM 02/06/2023    8:06 AM 09/25/2022    4:25 PM  BP/Weight  Systolic BP 137 128 117 108 100 125 148  Diastolic BP 72 72 68 80 63 71 62  Wt. (Lbs) 195.04 194 192  192.04 212.04 207.4  BMI 31.48 kg/m2 31.31 kg/m2 31.95 kg/m2  31.96 kg/m2 35.29 kg/m2 33.99 kg/m2       Obesity (BMI 30.0-34.9) Unchanged  Patient re-educated about  the importance of commitment to a  minimum of 150 minutes of exercise per week as able.  The importance of healthy food choices with portion control discussed, as well as eating regularly and within a 12 hour window most days. The need to choose clean , green food 50 to 75% of the time is discussed, as well  as to make water  the primary drink and set a goal of 64 ounces water  daily.       01/22/2024    8:04 AM 09/19/2023    9:40 AM 09/10/2023   10:11 AM  Weight /BMI  Weight 195 lb 0.6 oz 194 lb 192 lb  Height 5' 6 (1.676 m) 5' 6 (1.676 m) 5' 5 (1.651 m)  BMI 31.48 kg/m2 31.31 kg/m2 31.95 kg/m2      Anxiety Increased dur to poor health and current hospitalization of sibling in past 3 weeks , ventilated for 7 mins  Back pain with right-sided radiculopathy Meloxicam  limited amt for as needed use  Vitamin D  deficiency Updated lab needed at/ before next visit.   Seasonal allergies Currently controlled ,  asymptomatic and not taking medication regularly

## 2024-01-22 NOTE — Patient Instructions (Addendum)
 Follow-up early to mid January,  Start phentermine  1 tablet 4 days/week with a weight loss goal of 10 pounds in the next 4 months.  Please reduce fried and fatty foods your bad cholesterol has increased and is over the target.  Please get Tdap at your pharmacy.  Meloxicam  is prescribed a limited amount for use for arthritic pain.  Fasting CBC lipid CMP and eGFR TSH and vitamin D  to be obtained 3 to 5 days before your next visit.  Nurse pls prescribe knee high compression hose 14 to 19 mmHG , dx varicose veins

## 2024-01-22 NOTE — Assessment & Plan Note (Signed)
 Increased dur to poor health and current hospitalization of sibling in past 3 weeks , ventilated for 7 mins

## 2024-01-22 NOTE — Assessment & Plan Note (Signed)
 Unchanged  Patient re-educated about  the importance of commitment to a  minimum of 150 minutes of exercise per week as able.  The importance of healthy food choices with portion control discussed, as well as eating regularly and within a 12 hour window most days. The need to choose clean , green food 50 to 75% of the time is discussed, as well as to make water  the primary drink and set a goal of 64 ounces water  daily.       01/22/2024    8:04 AM 09/19/2023    9:40 AM 09/10/2023   10:11 AM  Weight /BMI  Weight 195 lb 0.6 oz 194 lb 192 lb  Height 5' 6 (1.676 m) 5' 6 (1.676 m) 5' 5 (1.651 m)  BMI 31.48 kg/m2 31.31 kg/m2 31.95 kg/m2

## 2024-01-22 NOTE — Assessment & Plan Note (Signed)
 Controlled, no change in medication DASH diet and commitment to daily physical activity for a minimum of 30 minutes discussed and encouraged, as a part of hypertension management. The importance of attaining a healthy weight is also discussed.     01/22/2024    8:04 AM 09/19/2023    9:40 AM 09/10/2023   10:11 AM 05/08/2023    9:39 AM 05/08/2023    8:42 AM 02/06/2023    8:06 AM 09/25/2022    4:25 PM  BP/Weight  Systolic BP 137 128 117 108 100 125 148  Diastolic BP 72 72 68 80 63 71 62  Wt. (Lbs) 195.04 194 192  192.04 212.04 207.4  BMI 31.48 kg/m2 31.31 kg/m2 31.95 kg/m2  31.96 kg/m2 35.29 kg/m2 33.99 kg/m2

## 2024-01-22 NOTE — Assessment & Plan Note (Signed)
 Updated lab needed at/ before next visit.

## 2024-01-22 NOTE — Assessment & Plan Note (Signed)
 Currently controlled , asymptomatic and not taking medication regularly

## 2024-01-22 NOTE — Assessment & Plan Note (Signed)
 Meloxicam  limited amt for as needed use

## 2024-01-22 NOTE — Assessment & Plan Note (Addendum)
 Needs compression hose same prescribed

## 2024-01-22 NOTE — Assessment & Plan Note (Signed)
 Hyperlipidemia:Low fat diet discussed and encouraged.   Lipid Panel  Lab Results  Component Value Date   CHOL 199 01/17/2024   HDL 55 01/17/2024   LDLCALC 130 (H) 01/17/2024   TRIG 77 01/17/2024   CHOLHDL 3.6 01/17/2024     Needs to reduce fat in diet and take med regularly as prescribed

## 2024-02-11 ENCOUNTER — Other Ambulatory Visit: Payer: Self-pay | Admitting: Family Medicine

## 2024-02-25 ENCOUNTER — Other Ambulatory Visit: Payer: Self-pay

## 2024-02-25 ENCOUNTER — Telehealth: Payer: Self-pay

## 2024-02-25 DIAGNOSIS — Z23 Encounter for immunization: Secondary | ICD-10-CM

## 2024-02-25 MED ORDER — UNABLE TO FIND
0 refills | Status: AC
Start: 2024-02-25 — End: ?

## 2024-02-25 NOTE — Telephone Encounter (Signed)
 Copied from CRM [id number]. Topic: General - Other >> Feb 25, 2024 11:25 AM Larissa RAMAN wrote: Reason for CRM: Patient states she is needing a covid prescription sent to her pharmacy and would like to know the date of her last pneumonia vaccine.   [facility] [address], [place] - [address] [place] [address] Phone: [phone] Fax: [phone] Hours: Not open 24 hours

## 2024-02-25 NOTE — Telephone Encounter (Signed)
 Sent!

## 2024-03-27 ENCOUNTER — Other Ambulatory Visit: Payer: Self-pay | Admitting: Family Medicine

## 2024-03-31 ENCOUNTER — Ambulatory Visit: Payer: Self-pay | Admitting: *Deleted

## 2024-03-31 NOTE — Telephone Encounter (Signed)
 FYI Only or Action Required?: FYI only for provider.  Patient was last seen in primary care on 01/22/2024 by [name], MD.  Called Nurse Triage reporting Dizziness.  Symptoms began several days ago.  Interventions attempted: Nothing.  Symptoms are: unchanged.  Triage Disposition: See PCP Within 2 Weeks  Patient/caregiver understands and will follow disposition?: Yes  Reason for Disposition  [1] MILD dizziness (e.g., vertigo; walking normally) AND [2] has been evaluated by doctor (or NP/PA) for this  Answer Assessment - Initial Assessment Questions 1. DESCRIPTION: Describe your dizziness.     Nausea, dizziness, hears shh noise in left ear 2. VERTIGO: Do you feel like either you or the room is spinning or tilting?      yes 3. LIGHTHEADED: Do you feel lightheaded? (e.g., somewhat faint, woozy, weak upon standing)     Woobles  4. SEVERITY: How bad is it?  Can you walk?     Mild- patient was able to drive home 5. ONSET:  When did the dizziness begin?     Saturday 6. AGGRAVATING FACTORS: Does anything make it worse? (e.g., standing, change in head position)     Movement, looking to left 7. CAUSE: What do you think is causing the dizziness?     vertigo 8. RECURRENT SYMPTOM: Have you had dizziness before? If Yes, ask: When was the last time? What happened that time?     Yes- she was able to get rid of it with PT 9. OTHER SYMPTOMS: Do you have any other symptoms? (e.g., earache, headache, numbness, tinnitus, vomiting, weakness)     Tinnitus- not ringing- but sound, headache  Protocols used: Dizziness - Vertigo-A-AH   Copied from CRM #[id number]. Topic: Clinical - Red Word Triage >> Mar 31, 2024  2:53 PM [name] wrote: Possible vertigo episode, dizziness, nauseous and shhh shhh shhh sound in left ear. [place].

## 2024-03-31 NOTE — Telephone Encounter (Signed)
Noted appointment scheduled.

## 2024-04-01 ENCOUNTER — Ambulatory Visit: Admitting: Family Medicine

## 2024-04-01 VITALS — BP 130/72 | HR 95 | Temp 98.8°F | Ht 66.0 in | Wt 194.0 lb

## 2024-04-01 DIAGNOSIS — H8110 Benign paroxysmal vertigo, unspecified ear: Secondary | ICD-10-CM | POA: Diagnosis not present

## 2024-04-01 DIAGNOSIS — R509 Fever, unspecified: Secondary | ICD-10-CM | POA: Diagnosis not present

## 2024-04-01 NOTE — Progress Notes (Signed)
 Subjective:  Patient ID: Karen [name], female    DOB: [birth year]  Age: 78 y.o. MRN: [id number]  CC:   Chief Complaint  Patient presents with   Dizziness    Swooshing sound in left ear , low grade fever 100 took tylenol     HPI:  78 year old female presents with the above complaints.  Has a history of Vertigo (2023). Responded well to vestibular rehab.  Reports that vertigo started on Friday. Report dizziness with rapid head movements. Associated nausea. Also reports tinnitus (L ear). She has taken some meclizine  with improvement.   Additionally, had fever today of 100.8 (about 2 hours ago). Took tylenol . No fever currently. No respiratory symptoms. Negative COVID test at home x 2.   Patient Active Problem List   Diagnosis Date Noted   Benign paroxysmal positional vertigo 04/01/2024   Fever 04/01/2024   Excessive daytime sleepiness 09/17/2022   Palpitations 04/06/2022   Unsteady gait when walking 09/14/2021   Anxiety 08/08/2021   Back pain with right-sided radiculopathy 04/07/2020   Vitamin D  deficiency 11/07/2019   Varicose veins of both lower extremities 08/13/2017   Osteopenia 12/21/2014   Seasonal allergies 06/07/2014   Goiter, nontoxic, multinodular 07/31/2013   Hx of adenomatous colonic polyps 05/01/2013   Metabolic syndrome X 09/14/2012   Osteoarthritis of knee 11/22/2011   Hyperlipidemia LDL goal <100 01/02/2008   Obesity (BMI 30.0-34.9) 01/02/2008   Essential hypertension, benign 01/02/2008    Social Hx   Social History   Socioeconomic History   Marital status: Married    Spouse name: Not on file   Number of children: 3   Years of education: 12   Highest education level: 12th grade  Occupational History   Occupation: fulltime - caregiver   Tobacco Use   Smoking status: Never   Smokeless tobacco: Never  Vaping Use   Vaping status: Never Used  Substance and Sexual Activity   Alcohol use: No   Drug use: No   Sexual activity: Not Currently     Birth control/protection: Surgical  Other Topics Concern   Not on file  Social History Narrative   Lives with husband alone, marriage is strained    Social Drivers of Health   Financial Resource Strain: Low Risk  (09/10/2023)   Overall Financial Resource Strain (CARDIA)    Difficulty of Paying Living Expenses: Not hard at all  Food Insecurity: No Food Insecurity (09/10/2023)   Hunger Vital Sign    Worried About Running Out of Food in the Last Year: Never true    Ran Out of Food in the Last Year: Never true  Transportation Needs: No Transportation Needs (09/10/2023)   PRAPARE - Administrator, Civil Service (Medical): No    Lack of Transportation (Non-Medical): No  Physical Activity: Insufficiently Active (09/10/2023)   Exercise Vital Sign    Days of Exercise per Week: 7 days    Minutes of Exercise per Session: 20 min  Stress: No Stress Concern Present (09/10/2023)   [employer] of Occupational Health - Occupational Stress Questionnaire    Feeling of Stress : Only a little  Social Connections: Socially Integrated (09/10/2023)   Social Connection and Isolation Panel    Frequency of Communication with Friends and Family: More than three times a week    Frequency of Social Gatherings with Friends and Family: More than three times a week    Attends Religious Services: More than 4 times per year    Active Member  of Clubs or Organizations: Yes    Attends Engineer, structural: More than 4 times per year    Marital Status: Married    Review of Systems Per HPI  Objective:  BP 130/72   Pulse 95   Temp 98.8 F (37.1 C)   Ht 5' 6 (1.676 m)   Wt 194 lb (88 kg)   SpO2 94%   BMI 31.31 kg/m      04/01/2024    4:25 PM 01/22/2024    8:04 AM 09/19/2023    9:40 AM  BP/Weight  Systolic BP 130 137 128  Diastolic BP 72 72 72  Wt. (Lbs) 194 195.04 194  BMI 31.31 kg/m2 31.48 kg/m2 31.31 kg/m2    Physical Exam Vitals and nursing note reviewed.  Constitutional:       General: She is not in acute distress. HENT:     Head: Normocephalic and atraumatic.     Right Ear: Tympanic membrane normal.     Left Ear: Tympanic membrane normal.  Cardiovascular:     Rate and Rhythm: Normal rate and regular rhythm.  Pulmonary:     Effort: Pulmonary effort is normal.     Breath sounds: Normal breath sounds.  Neurological:     Mental Status: She is alert.     Lab Results  Component Value Date   WBC 3.9 08/08/2023   HGB 12.0 08/08/2023   HCT 36.5 08/08/2023   PLT 342 08/08/2023   GLUCOSE 89 01/17/2024   CHOL 199 01/17/2024   TRIG 77 01/17/2024   HDL 55 01/17/2024   LDLCALC 130 (H) 01/17/2024   ALT 18 01/17/2024   AST 24 01/17/2024   NA 137 01/17/2024   K 4.3 01/17/2024   CL 102 01/17/2024   CREATININE 0.92 01/17/2024   BUN 24 01/17/2024   CO2 21 01/17/2024   TSH 1.330 08/08/2023   HGBA1C 5.4 11/05/2019     Assessment & Plan:  Benign paroxysmal positional vertigo, unspecified laterality Assessment & Plan: Meclizine  as needed. Referring for vestibular rehab.   Orders: -     Ambulatory referral to Physical Therapy  Fever, unspecified fever cause Assessment & Plan: Currently afebrile with no respiratory symptoms. Supportive care.     Follow-up:  Return if symptoms worsen or fail to improve.  Jacqulyn Ahle DO [facility]

## 2024-04-01 NOTE — Assessment & Plan Note (Signed)
 Meclizine  as needed. Referring for vestibular rehab.

## 2024-04-01 NOTE — Assessment & Plan Note (Signed)
 Currently afebrile with no respiratory symptoms. Supportive care.

## 2024-04-17 ENCOUNTER — Encounter: Payer: Self-pay | Admitting: Orthopedic Surgery

## 2024-04-17 ENCOUNTER — Other Ambulatory Visit (INDEPENDENT_AMBULATORY_CARE_PROVIDER_SITE_OTHER): Payer: Self-pay

## 2024-04-17 ENCOUNTER — Ambulatory Visit: Admitting: Orthopedic Surgery

## 2024-04-17 DIAGNOSIS — M170 Bilateral primary osteoarthritis of knee: Secondary | ICD-10-CM

## 2024-04-17 DIAGNOSIS — M1712 Unilateral primary osteoarthritis, left knee: Secondary | ICD-10-CM

## 2024-04-17 DIAGNOSIS — G8929 Other chronic pain: Secondary | ICD-10-CM

## 2024-04-17 DIAGNOSIS — M25562 Pain in left knee: Secondary | ICD-10-CM

## 2024-04-17 NOTE — Progress Notes (Signed)
     04/17/2024   Chief Complaint  Patient presents with   Knee Pain    Left / increased pain past 10 days    09/25/2022 Knee Pain       LT knee-torn meniscus-wants to discuss knee scope RT knee-giving her more problems-unable to go to the track and walk due to pain      History this is a 78 year old female had a torn medial meniscus and had a MRI done which showed that.  She has bilateral knee pain with osteoarthritis and increasing symptoms on the right knee.  She says she only takes 1-2 Tylenol  a day and has backed off to 1 Tylenol  a day although ibuprofen  did better.  Her primary care doctor told her not to take the ibuprofen  and instead use Tylenol   Nurse intake What pharmacy do you use ? _______[facility]____  DOI/DOS/ Date: 10 days ago felt something move in left knee increased pain since   Did you get better, worse or no change since your last visit (Answer below)  Worse  Today:  78 year old female bilateral knee arthritis  She felt something slipp in her left knee near the patellofemoral region and noticed increased crepitation  This was about 10 days ago and now she feels better.  She was wearing some knee sleeves using her cane.  She has noticed that she does not have to use her cane is much and she is starting to improve in terms of her gait  Focused examination of the left knee  Trace joint effusion is noted Skin is relatively normal with some varicosities and subcutaneous spots Crepitance on range of motion  No synovitis today no warmth  Patellofemoral joint pain with compression  Knee stable  No patellar subluxation dislocation  DG Knee AP/LAT W/Sunrise Left Result Date: 04/17/2024 X-rays left knee with AP both knees, history of osteoarthritis acute pain 10 days ago X-ray shows grade 4 arthritis of the left and right knee Findings include #1 overall neutral tibiofemoral alignment of the left knee in varus alignment of 5 degrees for  estimated right knee Left knee shows subchondral cyst subchondral sclerosis on both sides of the joint with small osteophyte formation and severe joint space narrowing Grade 4 OA left knee grade 4 OA right knee   Assessment and plan  Acute on chronic knee pain with osteoarthritis seems to be improving continue conservative care activity modification Tylenol  as needed cane use as needed return as needed

## 2024-04-17 NOTE — Progress Notes (Signed)
    04/17/2024   Chief Complaint  Patient presents with   Knee Pain    Left / increased pain past 10 days     No diagnosis found.  What pharmacy do you use ? _____[facility]____  DOI/DOS/ Date: 10 days ago felt something move in left knee increased pain since   Did you get better, worse or no change (Answer below)   78 year old female bilateral knee arthritis  She felt something slipped in her left knee near the patellofemoral region and noticed increased crepitation  This was about 10 days ago and now she feels better.  She was wearing some knee sleeves using her cane.  She has noticed that she does not have to use her cane is much and she is starting to improve in terms of her gait

## 2024-04-22 ENCOUNTER — Other Ambulatory Visit: Payer: Self-pay

## 2024-04-22 ENCOUNTER — Encounter (HOSPITAL_COMMUNITY): Payer: Self-pay

## 2024-04-22 ENCOUNTER — Ambulatory Visit (HOSPITAL_COMMUNITY): Attending: Family Medicine

## 2024-04-22 DIAGNOSIS — H8112 Benign paroxysmal vertigo, left ear: Secondary | ICD-10-CM | POA: Diagnosis present

## 2024-04-22 DIAGNOSIS — Z7409 Other reduced mobility: Secondary | ICD-10-CM | POA: Diagnosis present

## 2024-04-22 DIAGNOSIS — H8110 Benign paroxysmal vertigo, unspecified ear: Secondary | ICD-10-CM | POA: Insufficient documentation

## 2024-04-22 NOTE — Therapy (Signed)
 OUTPATIENT PHYSICAL THERAPY VESTIBULAR EVALUATION     Patient Name: Karen [name] MRN: [id number] DOB:1945-10-30, 78 y.o., female Today's Date: 04/22/2024  END OF SESSION:  PT End of Session - 04/22/24 0857     Visit Number 1    Date for Recertification  05/23/24    Authorization Type [employer] MEDICARE    Authorization Time Period seeking auth, first 6 approved    Authorization - Visit Number 1    Progress Note Due on Visit 6    PT Start Time 0731    PT Stop Time 0811    PT Time Calculation (min) 40 min    Activity Tolerance Patient tolerated treatment well    Behavior During Therapy [facility] for tasks assessed/performed          Past Medical History:  Diagnosis Date   Arthritis    C. difficile colitis Jul 18, 2013   Vancomycin . Recheck Cdiff positive. Vancomycin  continued for an additional 2 weeks   H/O herpes zoster    Hyperlipidemia    Hypertension    Metabolic syndrome X    Pes anserinus tendinitis 11/22/2011   Prediabetes 2014   lifestyle    Prediabetes    Past Surgical History:  Procedure Laterality Date   ABDOMINAL HYSTERECTOMY  1983 approx   fibroids, partial   ANKLE SURGERY     COLONOSCOPY  04/27/2005   MFM:Ipfpwlupcz polyp at the splenic flexure cold biopsied/removed. Mid  descending polyp removed with cold snare technique. The remainder of the of the colon looked normal/ Normal rectum. inflammed adenomatous polyps.   COLONOSCOPY N/A 05/26/2013   Dr. Rourk:Multiple colonic polyps removed, tubular adenomas. Needs surveillance Dec 2017.    COLONOSCOPY N/A 05/24/2017   Procedure: COLONOSCOPY;  Surgeon: Shaaron Lamar HERO, MD;  [facility]: [facility];  Service: Endoscopy;  Laterality: N/A;  1:00 pm   COLONOSCOPY WITH PROPOFOL  N/A 08/02/2022   Procedure: COLONOSCOPY WITH PROPOFOL ;  Surgeon: Shaaron Lamar HERO, MD;  Location: [facility];  Service: Endoscopy;  Laterality: N/A;  1015am, asa 2   FRACTURE SURGERY Right mid 1990's   pin put in and waas removed    POLYPECTOMY  08/02/2022   Procedure: POLYPECTOMY;  Surgeon: Shaaron Lamar HERO, MD;  Location: [facility];  Service: Endoscopy;;   Patient Active Problem List   Diagnosis Date Noted   Benign paroxysmal positional vertigo 04/01/2024   Fever 04/01/2024   Excessive daytime sleepiness 09/17/2022   Palpitations 04/06/2022   Unsteady gait when walking 09/14/2021   Anxiety 08/08/2021   Back pain with right-sided radiculopathy 04/07/2020   Vitamin D  deficiency 11/07/2019   Varicose veins of both lower extremities 08/13/2017   Osteopenia 12/21/2014   Seasonal allergies 06/07/2014   Goiter, nontoxic, multinodular 07/31/2013   Hx of adenomatous colonic polyps 05/01/2013   Metabolic syndrome X 09/14/2012   Osteoarthritis of knee 11/22/2011   Hyperlipidemia LDL goal <100 01/02/2008   Obesity (BMI 30.0-34.9) 01/02/2008   Essential hypertension, benign 01/02/2008    PCP: [name], MD  REFERRING PROVIDER: Bluford Jacqulyn MATSU, DO  REFERRING DIAG: H81.10 (ICD-10-CM) - Benign paroxysmal positional vertigo, unspecified laterality  THERAPY DIAG:  BPPV (benign paroxysmal positional vertigo), left  Impaired functional mobility and activity tolerance  ONSET DATE: 04/01/24  Rationale for Evaluation and Treatment: Rehabilitation  SUBJECTIVE:   SUBJECTIVE STATEMENT: Pt states she took meclizine  for a week 3 weeks ago, as needed since. Pt states she was seeking care due to the weird sound in the left ear  but this went away as soon as she got this appointment set up. Referring states it has to do with the inner ear system. Pt states she has not had the spinning feeling but does get dizzy and nauseated with laying head back to fast and looking side to side too fast. Pt states she has had vertigo before but this time it is not as bad.  Pt accompanied by: self  PERTINENT HISTORY:  Had vertigo a couple of years ago Left knee surgery planned for the first of the year  PAIN:  Are you having pain?  Left knee, 5/10, no dizziness this morning  PRECAUTIONS: None  RED FLAGS: None   WEIGHT BEARING RESTRICTIONS: No  FALLS: Has patient fallen in last 6 months? No  PLOF: Independent and Independent with basic ADLs  PATIENT GOALS: decrease the dizziness  OBJECTIVE:  Note: Objective measures were completed at Evaluation unless otherwise noted.  DIAGNOSTIC FINDINGS: none since most recent episode  COGNITION: Overall cognitive status: Within functional limits for tasks assessed   SENSATION: WFL    Cervical ROM:    Active A/PROM (deg) eval  Flexion Very cautious, no dizziness  Extension WFL  Right lateral flexion WFL  Left lateral flexion WFL  Right rotation WFL  Left rotation WFL  (Blank rows = not tested)    LOWER EXTREMITY MMT:   MMT Right eval Left eval  Hip flexion    Hip abduction    Hip adduction    Hip internal rotation    Hip external rotation    Knee flexion    Knee extension    Ankle dorsiflexion    Ankle plantarflexion    Ankle inversion    Ankle eversion    (Blank rows = not tested)   GAIT: Gait pattern: antalgic pattern, decreased stance time- Left, and decreased stride length Distance walked: 75 feet to and from treatment area Assistive device utilized: Single point cane Level of assistance: Modified independence Comments: pt uses can on occasion but carries it most of the time. Pt has just started using it due to knee pain, not for dizziness.    VESTIBULAR ASSESSMENT:   SYMPTOM BEHAVIOR:  Subjective history: see above  Non-Vestibular symptoms: changes in hearing and headaches  Type of dizziness: nauseating dizziness  Frequency: once in a while  Duration: just a minute  Aggravating factors: Induced by position change: lying supine, rolling to the right, and rolling to the left and Induced by motion: looking up at the ceiling, turning head quickly, and driving  Relieving factors: meclizine   Progression of symptoms:  better  OCULOMOTOR EXAM:  Ocular Alignment: normal  Ocular ROM: No Limitations  Spontaneous Nystagmus: absent  Gaze-Induced Nystagmus: absent  Smooth Pursuits: intact  Saccades: hypometric/undershoots  Convergence/Divergence: 9 cm , Left eye drifts off target, no dizziness      VESTIBULAR - OCULAR REFLEX:   Slow VOR: Positive Right and Positive Left  VOR Cancellation: Comment: positive for dizziness, like riding a ferris wheel   POSITIONAL TESTING: Left Dix-Hallpike: upbeating, left nystagmus  MOTION SENSITIVITY:  Motion Sensitivity Quotient Intensity: 0 = none, 1 = Lightheaded, 2 = Mild, 3 = Moderate, 4 = Severe, 5 = Vomiting  Intensity  1. Sitting to supine   2. Supine to L side   3. Supine to R side   4. Supine to sitting   5. L Hallpike-Dix   6. Up from L    7. R Hallpike-Dix   8. Up from R  9. Sitting, head tipped to L knee   10. Head up from L knee   11. Sitting, head tipped to R knee   12. Head up from R knee   13. Sitting head turns x5   14.Sitting head nods x5   15. In stance, 180 turn to L    16. In stance, 180 turn to R     OTHOSTATICS: not done  FUNCTIONAL GAIT: None completed this date                                                                                                                             TREATMENT DATE:  04/22/2024   Evaluation: -ROM measured, Strength assessed, HEP prescribed, pt educated on prognosis, findings, and importance of HEP compliance if given.    Canalith Repositioning:  Epley Left: Number of Reps: 1, Response to Treatment: comment: returned to baseline, and Comment: no nystagmus in position one although pt does report dizziness, position 2 however positive for dizziness and up beating left torsional nystagmus.  PATIENT EDUCATION: Education details: Pt was educated on findings of PT evaluation, prognosis, frequency of therapy visits and rationale, attendance policy, and HEP if given.   Person educated:  Patient Education method: Explanation, Verbal cues, and Handouts Education comprehension: verbalized understanding, verbal cues required, and needs further education  HOME EXERCISE PROGRAM: Access Code: [email or web address] URL: [email or web address] Date: 04/22/2024 Prepared by: Lang Ada  Exercises - Seated VOR Cancellation  - 1 x daily - 7 x weekly - 3 sets - 10 reps  GOALS: Goals reviewed with patient? No  SHORT TERM GOALS: Target date: 05/06/24  Pt will be independent with home exercise program in order to improve balance and decrease dizziness symptoms in order to decrease fall risk and improve function at home and work. Baseline:  Goal status: INITIAL  2.  Pt will report dizziness episodes to less than 2 times per week, for increased quality of life.  Baseline:  Goal status: INITIAL  LONG TERM GOALS: Target date: 05/20/24  Patient will demonstrate ability to complete FGA with minimal increase in dizziness and at a decreased falls risk >22/30.  Baseline:  Goal status: INITIAL  2.  Patient will demonstrate ability to complete Epley maneuver with min assist for independent management of vertigo symptoms at home.  Baseline:  Goal status: INITIAL  3.  Patient will demonstrate ability to complete 2 sets of VOR horizontal cancellization movents with no dizziness symptoms.  Baseline: dizziness reproduced Goal status: INITIAL  4.  Patient will report 50% or better improvement in their dizziness and imbalance symptoms overall in order for patient to be able to perform ADLs and resume prior activities. Baseline:  Goal status: INITIAL    ASSESSMENT:  CLINICAL IMPRESSION: Patient is a 78 y.o. female who was seen today for physical therapy evaluation and treatment for H81.10 (ICD-10-CM) - Benign paroxysmal positional vertigo, unspecified laterality.   Patient demonstrates increased dizziness with VOR cancellization, and left  dix hall [place]. Patient treated with Epley  Maneuver for left posterior semicircular canal with nystagmus noted in position two. Patient educated on vestibular anatomy, role of PT, movements to avoid following canalith repositioning this date and POC. Patient would continue to benefit from skilled physical therapy for decreased dizziness due to BPPV, increased endurance with ambulation, increased activity tolerance, and improved balance for improved quality of life, improved independence with management of dizziness and gait training and continued progress towards therapy goals.   OBJECTIVE IMPAIRMENTS: Abnormal gait, decreased activity tolerance, decreased balance, decreased endurance, difficulty walking, dizziness, and pain.   ACTIVITY LIMITATIONS: carrying, lifting, bending, squatting, stairs, transfers, and bed mobility  PARTICIPATION LIMITATIONS: meal prep, cleaning, laundry, driving, shopping, community activity, occupation, and yard work  PERSONAL FACTORS: Age, Past/current experiences, Time since onset of injury/illness/exacerbation, and 1 comorbidity: hx of vertigo are also affecting patient's functional outcome.   REHAB POTENTIAL: Good  CLINICAL DECISION MAKING: Stable/uncomplicated  EVALUATION COMPLEXITY: Low   PLAN:  PT FREQUENCY: 1-2x/week  PT DURATION: 4 weeks  PLANNED INTERVENTIONS: 97110-Therapeutic exercises, 97530- Therapeutic activity, 97112- Neuromuscular re-education, 97535- Self Care, 02859- Manual therapy, [id number]- Gait training, Patient/Family education, Balance training, Stair training, Vestibular training, and DME instructions  PLAN FOR NEXT SESSION: Retest L dix hall pike, progress vestibular training with VOR activities, be careful with left knee (sx planned first of the year per pt.) Possible balance assessment, FGA when vertigo subsides.   Lang Ada, PT, DPT [facility] Office: [phone] 9:03 AM, 04/22/24  [facility] Medicare Auth Request Information Treatment Start Date:  04/22/24  Date of referral: 04/01/2024 Referring provider: Cook, Jayce G, DO Referring diagnosis (ICD 10)? H81.10 (ICD-10-CM) - Benign paroxysmal positional vertigo, unspecified laterality Treatment diagnosis (ICD 10)? (if different than referring diagnosis) H81.12; Z74.09  What was this (referring dx) caused by? Ongoing Issue  Lysle of Condition: Initial Onset (within last 3 months)   Laterality: Lt  Current Functional Measure Score: Other none performed at eval  Objective measurements identify impairments when they are compared to normal values, the uninvolved extremity, and prior level of function.  [x]  Yes  []  No  Objective assessment of functional ability: Minimal functional limitations   Briefly describe symptoms: occasional dizziness/nausea with bed mobility and head movement while driving  How did symptoms start: insidious  Average pain intensity:  Last 24 hours: 5/10  Past week: 5/10  How often does the pt experience symptoms? Occasionally  How much have the symptoms interfered with usual daily activities? A little bit  How has condition changed since care began at this facility? NA - initial visit  In general, how is the patients overall health? Fair   BACK PAIN (STarT Back Screening Tool) No

## 2024-05-02 ENCOUNTER — Encounter (HOSPITAL_COMMUNITY): Payer: Self-pay

## 2024-05-02 ENCOUNTER — Ambulatory Visit (HOSPITAL_COMMUNITY)

## 2024-05-02 DIAGNOSIS — H8112 Benign paroxysmal vertigo, left ear: Secondary | ICD-10-CM

## 2024-05-02 DIAGNOSIS — Z7409 Other reduced mobility: Secondary | ICD-10-CM

## 2024-05-02 NOTE — Therapy (Signed)
 OUTPATIENT PHYSICAL THERAPY VESTIBULAR TREATMENT     Patient Name: Karen [name] MRN: [id number] DOB:1946-03-15, 78 y.o., female Today's Date: 05/02/2024  END OF SESSION:  PT End of Session - 05/02/24 0942     Visit Number 2    Number of Visits --    Date for Recertification  05/23/24    Authorization Type [employer] MEDICARE    Authorization Time Period seeking auth, first 6 approved    Authorization - Visit Number 2    Progress Note Due on Visit 6    PT Start Time [phone]    PT Stop Time 1020   Patient requested to leave early as it is her day off.   PT Time Calculation (min) 38 min    Activity Tolerance Patient tolerated treatment well    Behavior During Therapy [facility] for tasks assessed/performed           Past Medical History:  Diagnosis Date   Arthritis    C. difficile colitis Jul 18, 2013   Vancomycin . Recheck Cdiff positive. Vancomycin  continued for an additional 2 weeks   H/O herpes zoster    Hyperlipidemia    Hypertension    Metabolic syndrome X    Pes anserinus tendinitis 11/22/2011   Prediabetes 2014   lifestyle    Prediabetes    Past Surgical History:  Procedure Laterality Date   ABDOMINAL HYSTERECTOMY  1983 approx   fibroids, partial   ANKLE SURGERY     COLONOSCOPY  04/27/2005   MFM:Ipfpwlupcz polyp at the splenic flexure cold biopsied/removed. Mid  descending polyp removed with cold snare technique. The remainder of the of the colon looked normal/ Normal rectum. inflammed adenomatous polyps.   COLONOSCOPY N/A 05/26/2013   Dr. Rourk:Multiple colonic polyps removed, tubular adenomas. Needs surveillance Dec 2017.    COLONOSCOPY N/A 05/24/2017   Procedure: COLONOSCOPY;  Surgeon: Shaaron Lamar HERO, MD;  [facility]: [facility];  Service: Endoscopy;  Laterality: N/A;  1:00 pm   COLONOSCOPY WITH PROPOFOL  N/A 08/02/2022   Procedure: COLONOSCOPY WITH PROPOFOL ;  Surgeon: Shaaron Lamar HERO, MD;  Location: [facility];  Service: Endoscopy;  Laterality: N/A;  1015am, asa 2    FRACTURE SURGERY Right mid 1990's   pin put in and waas removed   POLYPECTOMY  08/02/2022   Procedure: POLYPECTOMY;  Surgeon: Shaaron Lamar HERO, MD;  Location: [facility];  Service: Endoscopy;;   Patient Active Problem List   Diagnosis Date Noted   Benign paroxysmal positional vertigo 04/01/2024   Fever 04/01/2024   Excessive daytime sleepiness 09/17/2022   Palpitations 04/06/2022   Unsteady gait when walking 09/14/2021   Anxiety 08/08/2021   Back pain with right-sided radiculopathy 04/07/2020   Vitamin D  deficiency 11/07/2019   Varicose veins of both lower extremities 08/13/2017   Osteopenia 12/21/2014   Seasonal allergies 06/07/2014   Goiter, nontoxic, multinodular 07/31/2013   Hx of adenomatous colonic polyps 05/01/2013   Metabolic syndrome X 09/14/2012   Osteoarthritis of knee 11/22/2011   Hyperlipidemia LDL goal <100 01/02/2008   Obesity (BMI 30.0-34.9) 01/02/2008   Essential hypertension, benign 01/02/2008    PCP: [name], MD  REFERRING PROVIDER: Bluford Jacqulyn MATSU, DO  REFERRING DIAG: H81.10 (ICD-10-CM) - Benign paroxysmal positional vertigo, unspecified laterality  THERAPY DIAG:  BPPV (benign paroxysmal positional vertigo), left  Impaired functional mobility and activity tolerance  ONSET DATE: 04/01/24  Rationale for Evaluation and Treatment: Rehabilitation  SUBJECTIVE:   SUBJECTIVE STATEMENT: Patient reports that her dizziness is doing a lot  better. She has not been dizzy since her last appointment. She is also doing her HEP every day.   Eval: Pt states she took meclizine  for a week 3 weeks ago, as needed since. Pt states she was seeking care due to the weird sound in the left ear but this went away as soon as she got this appointment set up. Referring states it has to do with the inner ear system. Pt states she has not had the spinning feeling but does get dizzy and nauseated with laying head back to fast and looking side to side too fast. Pt states  she has had vertigo before but this time it is not as bad.  Pt accompanied by: self  PERTINENT HISTORY:  Had vertigo a couple of years ago Left knee surgery planned for the first of the year  PAIN:  Are you having pain? Left knee, 5/10, no dizziness this morning  PRECAUTIONS: None  RED FLAGS: None   WEIGHT BEARING RESTRICTIONS: No  FALLS: Has patient fallen in last 6 months? No  PLOF: Independent and Independent with basic ADLs  PATIENT GOALS: decrease the dizziness  OBJECTIVE:  Note: Objective measures were completed at Evaluation unless otherwise noted.  DIAGNOSTIC FINDINGS: none since most recent episode  COGNITION: Overall cognitive status: Within functional limits for tasks assessed   SENSATION: WFL    Cervical ROM:    Active A/PROM (deg) eval  Flexion Very cautious, no dizziness  Extension WFL  Right lateral flexion WFL  Left lateral flexion WFL  Right rotation WFL  Left rotation WFL  (Blank rows = not tested)    LOWER EXTREMITY MMT:   MMT Right eval Left eval  Hip flexion    Hip abduction    Hip adduction    Hip internal rotation    Hip external rotation    Knee flexion    Knee extension    Ankle dorsiflexion    Ankle plantarflexion    Ankle inversion    Ankle eversion    (Blank rows = not tested)   GAIT: Gait pattern: antalgic pattern, decreased stance time- Left, and decreased stride length Distance walked: 75 feet to and from treatment area Assistive device utilized: Single point cane Level of assistance: Modified independence Comments: pt uses can on occasion but carries it most of the time. Pt has just started using it due to knee pain, not for dizziness.    VESTIBULAR ASSESSMENT:   SYMPTOM BEHAVIOR:  Subjective history: see above  Non-Vestibular symptoms: changes in hearing and headaches  Type of dizziness: nauseating dizziness  Frequency: once in a while  Duration: just a minute  Aggravating factors: Induced by  position change: lying supine, rolling to the right, and rolling to the left and Induced by motion: looking up at the ceiling, turning head quickly, and driving  Relieving factors: meclizine   Progression of symptoms: better  OCULOMOTOR EXAM:  Ocular Alignment: normal  Ocular ROM: No Limitations  Spontaneous Nystagmus: absent  Gaze-Induced Nystagmus: absent  Smooth Pursuits: intact  Saccades: hypometric/undershoots  Convergence/Divergence: 9 cm , Left eye drifts off target, no dizziness      VESTIBULAR - OCULAR REFLEX:   Slow VOR: Positive Right and Positive Left  VOR Cancellation: Comment: positive for dizziness, like riding a ferris wheel   POSITIONAL TESTING: Left Dix-Hallpike: upbeating, left nystagmus  MOTION SENSITIVITY:  Motion Sensitivity Quotient Intensity: 0 = none, 1 = Lightheaded, 2 = Mild, 3 = Moderate, 4 = Severe, 5 = Vomiting  Intensity  1. Sitting to supine   2. Supine to L side   3. Supine to R side   4. Supine to sitting   5. L Hallpike-Dix   6. Up from L    7. R Hallpike-Dix   8. Up from R    9. Sitting, head tipped to L knee   10. Head up from L knee   11. Sitting, head tipped to R knee   12. Head up from R knee   13. Sitting head turns x5   14.Sitting head nods x5   15. In stance, 180 turn to L    16. In stance, 180 turn to R     OTHOSTATICS: not done  FUNCTIONAL GAIT: Functional gait assessment:  FUNCTIONAL GAIT ASSESSMENT  Date: 05/02/24 Score  GAIT LEVEL SURFACE Instructions: Walk at your normal speed from here to the next mark (6 m) [20 ft]. (1) Moderate impairment-Walks 6 m (20 ft), slow speed, abnormal gait pattern, evidence for imbalance, or deviates 25.4 - 38.1 cm (10 -15 in) outside of the 30.48-cm (12-in) walkway width. Requires more than 7 seconds to ambulate 6 m (20 ft).  2.   CHANGE IN GAIT SPEED Instructions: Begin walking at your normal pace (for 1.5 m [5 ft]). When I tell you "go," walk as fast as you can (for 1.5 m [5 ft]).  When I tell you "slow," walk as slowly as you can (for 1.5 m [5 ft]. (3) Normal - Able to smoothly change walking speed without loss of balance or gait deviation. Shows a significant difference in walking speeds between normal, fast, and slow speeds. Deviates no more than 15.24 cm (6 in) outside of the 30.48-cm (12-in) walkway width.  3.    GAIT WITH HORIZONTAL HEAD TURNS Instructions: Walk from here to the next mark 6 m (20 ft) away. Begin walking at your normal pace. Keep walking straight; after 3 steps, turn your head to the right and keep walking straight while looking to the right. After 3 more steps, turn your head to the left and keep walking straight while looking left. Continue alternating looking right and left. (1) Moderate impairment - Performs head turns with moderate change in gait velocity, slows down, deviates 25.4 -38.1 cm (10 -15 in) outside 30.48-cm (12-in) walkway width but recovers, can continue to walk.  4.   GAIT WITH VERTICAL HEAD TURNS Instructions: Walk from here to the next mark (6 m [20 ft]). Begin walking at your normal pace. Keep walking straight; after 3 steps, tip your head up and keep walking straight while looking up. After 3 more steps, tip your head down, keep walking straight while looking down. Continue  alternating looking up and down every 3 steps until you have completed 2 repetitions in each direction. (1) Moderate impairment - Performs task with moderate change in gait velocity, slows down, deviates 25.4 -38.1 cm (10 -15 in) outside 30.48-cm (12-in) walkway width but recovers, can continue to walk.  5.  GAIT AND PIVOT TURN Instructions: Begin with walking at your normal pace. When I tell you, "turn and stop," turn as quickly as you can to face the opposite direction and stop. (1) Moderate impairment - Turns slowly, requires verbal cueing, or requires several small steps to catch balance following turn and stop  6.   STEP OVER OBSTACLE Instructions: Begin walking at  your normal speed. When you come to the shoe box, step over it, not around it, and keep walking. (1) Moderate impairment -  Is able to step over one shoe box (11.43 cm [4.5 in] total height) but must slow down and adjust steps to clear box safely. May require verbal cueing.  7.   GAIT WITH NARROW BASE OF SUPPORT Instructions: Walk on the floor with arms folded across the chest, feet aligned heel to toe in tandem for a distance of 3.6 m [12 ft]. The number of steps taken in a straight line are counted for a maximum of 10 steps. (0) Severe impairment - Ambulates less than 4 steps heel to toe or cannot perform without assistance.  8.   GAIT WITH EYES CLOSED Instructions: Walk at your normal speed from here to the next mark (6 m [20 ft]) with your eyes closed. (2) Mild impairment - Walks 6 m (20 ft), uses assistive device, slower speed, mild gait deviations, deviates 15.24 -25.4 cm (6 -10 in) outside 30.48-cm (12-in) walkway width. Ambulates 6 m (20 ft) in less than 9 seconds but greater than 7 seconds  9.   AMBULATING BACKWARDS Instructions: Walk backwards until I tell you to stop (2) Mild impairment - Walks 6 m (20 ft), uses assistive device, slower speed, mild gait deviations, deviates 15.24 -25.4 cm (6 -10 in) outside 30.48-cm (12-in) walkway width  10. STEPS Instructions: Walk up these stairs as you would at home (ie, using the rail if necessary). At the top turn around and walk down. (2) Mild impairment-Alternating feet, must use rail.  Total 14/30   Interpretation of scores: Non-Specific Older Adults Cutoff Score: <=22/30 = risk of falls Parkinson's Disease Cutoff score <15/30= fall risk (Hoehn & Yahr 1-4)  Minimally Clinically Important Difference (MCID)  Stroke (acute, subacute, and chronic) = MDC: 4.2 points Vestibular (acute) = MDC: 6 points Community Dwelling Older Adults =  MCID: 4 points Parkinson's Disease  =  MDC: 4.3 points  (Academy of Neu[employer]ic [employer] (nd). Functional  Gait Assessment. Retrieved from https://www.neuropt.org/docs/default-source/cpgs/core-outcome-measures/function-gait-assessment-pocket-guide-proof9-(2).pdf?sfvrsn=b66f35043_0.)                                                                                                                             TREATMENT DATE:                                    05/02/24 EXERCISE LOG  Exercise Repetitions and Resistance Comments  L Dix-Halpike  Negative   Reviewed goals and HEP       VOR x2  15 reps  Arm and head movement   FGA 14/30        Blank cell = exercise not performed today   04/22/2024   Evaluation: -ROM measured, Strength assessed, HEP prescribed, pt educated on prognosis, findings, and importance of HEP compliance if given.    Canalith Repositioning:  Epley Left: Number of Reps: 1, Response to Treatment: comment: returned to baseline, and Comment: no nystagmus in position one although pt does report  dizziness, position 2 however positive for dizziness and up beating left torsional nystagmus.  PATIENT EDUCATION: Education details: Pt was educated on findings of PT evaluation, prognosis, frequency of therapy visits and rationale, attendance policy, and HEP if given.   Person educated: Patient Education method: Explanation, Verbal cues, and Handouts Education comprehension: verbalized understanding, verbal cues required, and needs further education  HOME EXERCISE PROGRAM: Access Code: [email or web address] URL: [email or web address] Date: 05/02/2024 Prepared by: Lacinda Fass  Exercises - Seated VOR Cancellation  - 1 x daily - 7 x weekly - 3 sets - 10 reps - Seated Gaze Stabilization with Head Rotation  - 1 x daily - 7 x weekly - 3 sets - 10 reps - Seated Gaze Stabilization with Head Rotation and Horizontal Arm Movement  - 1 x daily - 7 x weekly - 3 sets - 10 reps  Access Code: [email or web address] URL: [email or web address] Date: 04/22/2024 Prepared by: Lang Ada  Exercises - Seated VOR Cancellation  - 1 x daily - 7 x weekly - 3 sets - 10 reps  GOALS: Goals reviewed with patient? No  SHORT TERM GOALS: Target date: 05/06/24  Pt will be independent with home exercise program in order to improve balance and decrease dizziness symptoms in order to decrease fall risk and improve function at home and work. Baseline:  Goal status: MET  2.  Pt will report dizziness episodes to less than 2 times per week, for increased quality of life.  Baseline:  Goal status: INITIAL  LONG TERM GOALS: Target date: 05/20/24  Patient will demonstrate ability to complete FGA with minimal increase in dizziness and at a decreased falls risk >22/30.  Baseline:  Goal status: INITIAL  2.  Patient will demonstrate ability to complete Epley maneuver with min assist for independent management of vertigo symptoms at home.  Baseline:  Goal status: INITIAL  3.  Patient will demonstrate ability to complete 2 sets of VOR horizontal cancellization movents with no dizziness symptoms.  Baseline: dizziness reproduced Goal status: INITIAL  4.  Patient will report 50% or better improvement in their dizziness and imbalance symptoms overall in order for patient to be able to perform ADLs and resume prior activities. Baseline:  Goal status: INITIAL    ASSESSMENT:  CLINICAL IMPRESSION: Patient presented to treatment reporting no dizziness or unsteadiness since her last appointment. Her Dix-Halpike was reassessed with this being negative. Her HEP was reviewed and updated with today's new interventions. She was able to properly demonstrate these interventions. Her FGA was assessed at 14/30 which places her at an elevated risk of falling. However, this could partially be attributed to her need for a [facility] with ambulation secondary to her knee pain. She reported feeling fine upon the conclusion of treatment. Patient continues to require skilled physical therapy to address her  remaining impairments to return to her prior level of function.    Eval: Patient is a 78 y.o. female who was seen today for physical therapy evaluation and treatment for H81.10 (ICD-10-CM) - Benign paroxysmal positional vertigo, unspecified laterality. Patient demonstrates increased dizziness with VOR cancellization, and left dix hall pike. Patient treated with Epley Maneuver for left posterior semicircular canal with nystagmus noted in position two. Patient educated on vestibular anatomy, role of PT, movements to avoid following canalith repositioning this date and POC. Patient would continue to benefit from skilled physical therapy for decreased dizziness due to BPPV, increased endurance with ambulation, increased activity tolerance, and improved balance for improved quality of life, improved independence with management  of dizziness and gait training and continued progress towards therapy goals.   OBJECTIVE IMPAIRMENTS: Abnormal gait, decreased activity tolerance, decreased balance, decreased endurance, difficulty walking, dizziness, and pain.   ACTIVITY LIMITATIONS: carrying, lifting, bending, squatting, stairs, transfers, and bed mobility  PARTICIPATION LIMITATIONS: meal prep, cleaning, laundry, driving, shopping, community activity, occupation, and yard work  PERSONAL FACTORS: Age, Past/current experiences, Time since onset of injury/illness/exacerbation, and 1 comorbidity: hx of vertigo are also affecting patient's functional outcome.   REHAB POTENTIAL: Good  CLINICAL DECISION MAKING: Stable/uncomplicated  EVALUATION COMPLEXITY: Low   PLAN:  PT FREQUENCY: 1-2x/week  PT DURATION: 4 weeks  PLANNED INTERVENTIONS: 97110-Therapeutic exercises, 97530- Therapeutic activity, 97112- Neuromuscular re-education, 97535- Self Care, [id number]- Manual therapy, [phone]- Gait training, Patient/Family education, Balance training, Stair training, Vestibular training, and DME instructions  PLAN FOR NEXT  SESSION: Retest L dix hall pike, progress vestibular training with VOR activities, be careful with left knee (sx planned first of the year per pt.) Possible balance assessment, FGA when vertigo subsides.   Lacinda Fass, PT, DPT  1:11 PM, 05/02/24

## 2024-05-07 ENCOUNTER — Ambulatory Visit (HOSPITAL_COMMUNITY)

## 2024-05-07 DIAGNOSIS — H8112 Benign paroxysmal vertigo, left ear: Secondary | ICD-10-CM

## 2024-05-07 DIAGNOSIS — Z7409 Other reduced mobility: Secondary | ICD-10-CM

## 2024-05-07 NOTE — Therapy (Signed)
 OUTPATIENT PHYSICAL THERAPY VESTIBULAR TREATMENT/ DISCHARGE NOTE PHYSICAL THERAPY DISCHARGE SUMMARY  Visits from Start of Care: 3  Current functional level related to goals / functional outcomes: See below   Remaining deficits: See below   Education / Equipment: HEP   Patient agrees to discharge. Patient goals were partially met. Patient is being discharged due to being pleased with the current functional level.      Patient Name: Karen [name] MRN: [id number] DOB:[birth year], 78 y.o., female Today's Date: 05/07/2024  END OF SESSION:  PT End of Session - 05/07/24 0858     Visit Number 3    Date for Recertification  05/23/24    Authorization Type [employer] MEDICARE    Authorization Time Period seeking auth, first 6 approved    Authorization - Visit Number 3    Authorization - Number of Visits 6    Progress Note Due on Visit 6    PT Start Time 0901    PT Stop Time 0915    PT Time Calculation (min) 14 min    Activity Tolerance Patient tolerated treatment well    Behavior During Therapy [facility] for tasks assessed/performed           Past Medical History:  Diagnosis Date   Arthritis    C. difficile colitis Jul 18, 2013   Vancomycin . Recheck Cdiff positive. Vancomycin  continued for an additional 2 weeks   H/O herpes zoster    Hyperlipidemia    Hypertension    Metabolic syndrome X    Pes anserinus tendinitis 11/22/2011   Prediabetes 2014   lifestyle    Prediabetes    Past Surgical History:  Procedure Laterality Date   ABDOMINAL HYSTERECTOMY  1983 approx   fibroids, partial   ANKLE SURGERY     COLONOSCOPY  04/27/2005   MFM:Ipfpwlupcz polyp at the splenic flexure cold biopsied/removed. Mid  descending polyp removed with cold snare technique. The remainder of the of the colon looked normal/ Normal rectum. inflammed adenomatous polyps.   COLONOSCOPY N/A 05/26/2013   Dr. Rourk:Multiple colonic polyps removed, tubular adenomas. Needs surveillance Dec 2017.    COLONOSCOPY  N/A 05/24/2017   Procedure: COLONOSCOPY;  Surgeon: Shaaron Lamar HERO, MD;  [facility];  Service: Endoscopy;  Laterality: N/A;  1:00 pm   COLONOSCOPY WITH PROPOFOL  N/A 08/02/2022   Procedure: COLONOSCOPY WITH PROPOFOL ;  Surgeon: Shaaron Lamar HERO, MD;  Location: [facility];  Service: Endoscopy;  Laterality: N/A;  1015am, asa 2   FRACTURE SURGERY Right mid 1990's   pin put in and waas removed   POLYPECTOMY  08/02/2022   Procedure: POLYPECTOMY;  Surgeon: Shaaron Lamar HERO, MD;  Location: [facility];  Service: Endoscopy;;   Patient Active Problem List   Diagnosis Date Noted   Benign paroxysmal positional vertigo 04/01/2024   Fever 04/01/2024   Excessive daytime sleepiness 09/17/2022   Palpitations 04/06/2022   Unsteady gait when walking 09/14/2021   Anxiety 08/08/2021   Back pain with right-sided radiculopathy 04/07/2020   Vitamin D  deficiency 11/07/2019   Varicose veins of both lower extremities 08/13/2017   Osteopenia 12/21/2014   Seasonal allergies 06/07/2014   Goiter, nontoxic, multinodular 07/31/2013   Hx of adenomatous colonic polyps 05/01/2013   Metabolic syndrome X 09/14/2012   Osteoarthritis of knee 11/22/2011   Hyperlipidemia LDL goal <100 01/02/2008   Obesity (BMI 30.0-34.9) 01/02/2008   Essential hypertension, benign 01/02/2008    PCP: [name], MD  REFERRING PROVIDER: Cook, Jayce G, DO  REFERRING DIAG:  H81.10 (ICD-10-CM) - Benign paroxysmal positional vertigo, unspecified laterality  THERAPY DIAG:  BPPV (benign paroxysmal positional vertigo), left  Impaired functional mobility and activity tolerance  ONSET DATE: 04/01/24  Rationale for Evaluation and Treatment: Rehabilitation  SUBJECTIVE:   SUBJECTIVE STATEMENT: Still doing well;  I think I don't need anymore; 90% better  Eval: Pt states she took meclizine  for a week 3 weeks ago, as needed since. Pt states she was seeking care due to the weird sound in the left ear but this went away  as soon as she got this appointment set up. Referring states it has to do with the inner ear system. Pt states she has not had the spinning feeling but does get dizzy and nauseated with laying head back to fast and looking side to side too fast. Pt states she has had vertigo before but this time it is not as bad.  Pt accompanied by: self  PERTINENT HISTORY:  Had vertigo a couple of years ago Left knee surgery planned for the first of the year  PAIN:  Are you having pain? Left knee, 5/10, no dizziness this morning  PRECAUTIONS: None  RED FLAGS: None   WEIGHT BEARING RESTRICTIONS: No  FALLS: Has patient fallen in last 6 months? No  PLOF: Independent and Independent with basic ADLs  PATIENT GOALS: decrease the dizziness  OBJECTIVE:  Note: Objective measures were completed at Evaluation unless otherwise noted.  DIAGNOSTIC FINDINGS: none since most recent episode  COGNITION: Overall cognitive status: Within functional limits for tasks assessed   SENSATION: WFL    Cervical ROM:    Active A/PROM (deg) eval  Flexion Very cautious, no dizziness  Extension WFL  Right lateral flexion WFL  Left lateral flexion WFL  Right rotation WFL  Left rotation WFL  (Blank rows = not tested)    LOWER EXTREMITY MMT:   MMT Right eval Left eval  Hip flexion    Hip abduction    Hip adduction    Hip internal rotation    Hip external rotation    Knee flexion    Knee extension    Ankle dorsiflexion    Ankle plantarflexion    Ankle inversion    Ankle eversion    (Blank rows = not tested)   GAIT: Gait pattern: antalgic pattern, decreased stance time- Left, and decreased stride length Distance walked: 75 feet to and from treatment area Assistive device utilized: Single point cane Level of assistance: Modified independence Comments: pt uses can on occasion but carries it most of the time. Pt has just started using it due to knee pain, not for dizziness.    VESTIBULAR  ASSESSMENT:   SYMPTOM BEHAVIOR:  Subjective history: see above  Non-Vestibular symptoms: changes in hearing and headaches  Type of dizziness: nauseating dizziness  Frequency: once in a while  Duration: just a minute  Aggravating factors: Induced by position change: lying supine, rolling to the right, and rolling to the left and Induced by motion: looking up at the ceiling, turning head quickly, and driving  Relieving factors: meclizine   Progression of symptoms: better  OCULOMOTOR EXAM:  Ocular Alignment: normal  Ocular ROM: No Limitations  Spontaneous Nystagmus: absent  Gaze-Induced Nystagmus: absent  Smooth Pursuits: intact  Saccades: hypometric/undershoots  Convergence/Divergence: 9 cm , Left eye drifts off target, no dizziness      VESTIBULAR - OCULAR REFLEX:   Slow VOR: Positive Right and Positive Left  VOR Cancellation: Comment: positive for dizziness, like riding a ferris wheel  POSITIONAL TESTING: Left Dix-Hallpike: upbeating, left nystagmus  MOTION SENSITIVITY:  Motion Sensitivity Quotient Intensity: 0 = none, 1 = Lightheaded, 2 = Mild, 3 = Moderate, 4 = Severe, 5 = Vomiting  Intensity  1. Sitting to supine   2. Supine to L side   3. Supine to R side   4. Supine to sitting   5. L Hallpike-Dix   6. Up from L    7. R Hallpike-Dix   8. Up from R    9. Sitting, head tipped to L knee   10. Head up from L knee   11. Sitting, head tipped to R knee   12. Head up from R knee   13. Sitting head turns x5   14.Sitting head nods x5   15. In stance, 180 turn to L    16. In stance, 180 turn to R     OTHOSTATICS: not done  FUNCTIONAL GAIT: Functional gait assessment:  FUNCTIONAL GAIT ASSESSMENT  Date: 05/02/24 Score  GAIT LEVEL SURFACE Instructions: Walk at your normal speed from here to the next mark (6 m) [20 ft]. (1) Moderate impairment-Walks 6 m (20 ft), slow speed, abnormal gait pattern, evidence for imbalance, or deviates 25.4 - 38.1 cm (10 -15 in) outside  of the 30.48-cm (12-in) walkway width. Requires more than 7 seconds to ambulate 6 m (20 ft).  2.   CHANGE IN GAIT SPEED Instructions: Begin walking at your normal pace (for 1.5 m [5 ft]). When I tell you "go," walk as fast as you can (for 1.5 m [5 ft]). When I tell you "slow," walk as slowly as you can (for 1.5 m [5 ft]. (3) Normal - Able to smoothly change walking speed without loss of balance or gait deviation. Shows a significant difference in walking speeds between normal, fast, and slow speeds. Deviates no more than 15.24 cm (6 in) outside of the 30.48-cm (12-in) walkway width.  3.    GAIT WITH HORIZONTAL HEAD TURNS Instructions: Walk from here to the next mark 6 m (20 ft) away. Begin walking at your normal pace. Keep walking straight; after 3 steps, turn your head to the right and keep walking straight while looking to the right. After 3 more steps, turn your head to the left and keep walking straight while looking left. Continue alternating looking right and left. (1) Moderate impairment - Performs head turns with moderate change in gait velocity, slows down, deviates 25.4 -38.1 cm (10 -15 in) outside 30.48-cm (12-in) walkway width but recovers, can continue to walk.  4.   GAIT WITH VERTICAL HEAD TURNS Instructions: Walk from here to the next mark (6 m [20 ft]). Begin walking at your normal pace. Keep walking straight; after 3 steps, tip your head up and keep walking straight while looking up. After 3 more steps, tip your head down, keep walking straight while looking down. Continue  alternating looking up and down every 3 steps until you have completed 2 repetitions in each direction. (1) Moderate impairment - Performs task with moderate change in gait velocity, slows down, deviates 25.4 -38.1 cm (10 -15 in) outside 30.48-cm (12-in) walkway width but recovers, can continue to walk.  5.  GAIT AND PIVOT TURN Instructions: Begin with walking at your normal pace. When I tell you, "turn and stop," turn as  quickly as you can to face the opposite direction and stop. (1) Moderate impairment - Turns slowly, requires verbal cueing, or requires several small steps to catch balance following turn  and stop  6.   STEP OVER OBSTACLE Instructions: Begin walking at your normal speed. When you come to the shoe box, step over it, not around it, and keep walking. (1) Moderate impairment - Is able to step over one shoe box (11.43 cm [4.5 in] total height) but must slow down and adjust steps to clear box safely. May require verbal cueing.  7.   GAIT WITH NARROW BASE OF SUPPORT Instructions: Walk on the floor with arms folded across the chest, feet aligned heel to toe in tandem for a distance of 3.6 m [12 ft]. The number of steps taken in a straight line are counted for a maximum of 10 steps. (0) Severe impairment - Ambulates less than 4 steps heel to toe or cannot perform without assistance.  8.   GAIT WITH EYES CLOSED Instructions: Walk at your normal speed from here to the next mark (6 m [20 ft]) with your eyes closed. (2) Mild impairment - Walks 6 m (20 ft), uses assistive device, slower speed, mild gait deviations, deviates 15.24 -25.4 cm (6 -10 in) outside 30.48-cm (12-in) walkway width. Ambulates 6 m (20 ft) in less than 9 seconds but greater than 7 seconds  9.   AMBULATING BACKWARDS Instructions: Walk backwards until I tell you to stop (2) Mild impairment - Walks 6 m (20 ft), uses assistive device, slower speed, mild gait deviations, deviates 15.24 -25.4 cm (6 -10 in) outside 30.48-cm (12-in) walkway width  10. STEPS Instructions: Walk up these stairs as you would at home (ie, using the rail if necessary). At the top turn around and walk down. (2) Mild impairment-Alternating feet, must use rail.  Total 14/30   Interpretation of scores: Non-Specific Older Adults Cutoff Score: <=22/30 = risk of falls Parkinson's Disease Cutoff score <15/30= fall risk (Hoehn & Yahr 1-4)  Minimally Clinically Important Difference  (MCID)  Stroke (acute, subacute, and chronic) = MDC: 4.2 points Vestibular (acute) = MDC: 6 points Community Dwelling Older Adults =  MCID: 4 points Parkinson's Disease  =  MDC: 4.3 points  (Academy of [employer] Physical [employer] (nd). Functional Gait Assessment. Retrieved from https://www.neuropt.org/docs/default-source/cpgs/core-outcome-measures/function-gait-assessment-pocket-guide-proof9-(2).pdf?sfvrsn=b12f35043_0.)                                                                                                                             TREATMENT DATE:                                    05/02/24 EXERCISE LOG  Exercise Repetitions and Resistance Comments  L Dix-Halpike  Negative   Reviewed goals and HEP       VOR x2  15 reps  Arm and head movement   FGA 14/30        Blank cell = exercise not performed today   04/22/2024   Evaluation: -ROM measured, Strength assessed, HEP prescribed, pt educated on prognosis, findings, and  importance of HEP compliance if given.    Canalith Repositioning:  Epley Left: Number of Reps: 1, Response to Treatment: comment: returned to baseline, and Comment: no nystagmus in position one although pt does report dizziness, position 2 however positive for dizziness and up beating left torsional nystagmus.  PATIENT EDUCATION: Education details: Pt was educated on findings of PT evaluation, prognosis, frequency of therapy visits and rationale, attendance policy, and HEP if given.   Person educated: Patient Education method: Explanation, Verbal cues, and Handouts Education comprehension: verbalized understanding, verbal cues required, and needs further education  HOME EXERCISE PROGRAM: Access Code: [email or web address] URL: [email or web address] Date: 05/02/2024 Prepared by: Lacinda Fass  Exercises - Seated VOR Cancellation  - 1 x daily - 7 x weekly - 3 sets - 10 reps - Seated Gaze Stabilization with Head Rotation  - 1 x daily - 7 x weekly - 3  sets - 10 reps - Seated Gaze Stabilization with Head Rotation and Horizontal Arm Movement  - 1 x daily - 7 x weekly - 3 sets - 10 reps  Access Code: [email or web address] URL: [email or web address] Date: 04/22/2024 Prepared by: Lang Ada  Exercises - Seated VOR Cancellation  - 1 x daily - 7 x weekly - 3 sets - 10 reps  GOALS: Goals reviewed with patient? No  SHORT TERM GOALS: Target date: 05/06/24  Pt will be independent with home exercise program in order to improve balance and decrease dizziness symptoms in order to decrease fall risk and improve function at home and work. Baseline:  Goal status: MET  2.  Pt will report dizziness episodes to less than 2 times per week, for increased quality of life.  Baseline:  Goal status: MET  LONG TERM GOALS: Target date: 05/20/24  Patient will demonstrate ability to complete FGA with minimal increase in dizziness and at a decreased falls risk >22/30.  Baseline:  Goal status: IN PROGRESS  2.  Patient will demonstrate ability to complete Epley maneuver with min assist for independent management of vertigo symptoms at home.  Baseline:  Goal status: MET  3.  Patient will demonstrate ability to complete 2 sets of VOR horizontal cancellization movents with no dizziness symptoms.  Baseline: dizziness reproduced Goal status: MET  4.  Patient will report 50% or better improvement in their dizziness and imbalance symptoms overall in order for patient to be able to perform ADLs and resume prior activities. Baseline:  Goal status: MET    ASSESSMENT:  CLINICAL IMPRESSION: Patient without any compliant of dizziness and has met all but one set rehab goal; did not retest FGA as she just took last visit and limitations likely due to OA of knees.  Patient is agreeable to discharge at this time  Eval: Patient is a 78 y.o. female who was seen today for physical therapy evaluation and treatment for H81.10 (ICD-10-CM) - Benign paroxysmal  positional vertigo, unspecified laterality. Patient demonstrates increased dizziness with VOR cancellization, and left dix hall pike. Patient treated with Epley Maneuver for left posterior semicircular canal with nystagmus noted in position two. Patient educated on vestibular anatomy, role of PT, movements to avoid following canalith repositioning this date and POC. Patient would continue to benefit from skilled physical therapy for decreased dizziness due to BPPV, increased endurance with ambulation, increased activity tolerance, and improved balance for improved quality of life, improved independence with management of dizziness and gait training and continued progress towards therapy goals.   OBJECTIVE IMPAIRMENTS: Abnormal gait, decreased activity tolerance, decreased balance, decreased endurance, difficulty  walking, dizziness, and pain.   ACTIVITY LIMITATIONS: carrying, lifting, bending, squatting, stairs, transfers, and bed mobility  PARTICIPATION LIMITATIONS: meal prep, cleaning, laundry, driving, shopping, community activity, occupation, and yard work  PERSONAL FACTORS: Age, Past/current experiences, Time since onset of injury/illness/exacerbation, and 1 comorbidity: hx of vertigo are also affecting patient's functional outcome.   REHAB POTENTIAL: Good  CLINICAL DECISION MAKING: Stable/uncomplicated  EVALUATION COMPLEXITY: Low   PLAN:  PT FREQUENCY: 1-2x/week  PT DURATION: 4 weeks  PLANNED INTERVENTIONS: 97110-Therapeutic exercises, 97530- Therapeutic activity, 97112- Neuromuscular re-education, 97535- Self Care, [id number]- Manual therapy, [phone]- Gait training, Patient/Family education, Balance training, Stair training, Vestibular training, and DME instructions  PLAN FOR NEXT SESSION: discharge  9:16 AM, 05/07/24 Trenae Brunke Small Karell Tukes MPT [facility] [place] [phone] [phone]

## 2024-05-09 ENCOUNTER — Other Ambulatory Visit: Payer: Self-pay | Admitting: Family Medicine

## 2024-05-14 ENCOUNTER — Ambulatory Visit (HOSPITAL_COMMUNITY)

## 2024-05-21 ENCOUNTER — Ambulatory Visit (HOSPITAL_COMMUNITY)

## 2024-05-28 ENCOUNTER — Ambulatory Visit (HOSPITAL_COMMUNITY)

## 2024-06-14 ENCOUNTER — Ambulatory Visit: Payer: Self-pay | Admitting: Family Medicine

## 2024-06-14 LAB — LIPID PANEL
Chol/HDL Ratio: 2.9 ratio (ref 0.0–4.4)
Cholesterol, Total: 176 mg/dL (ref 100–199)
HDL: 61 mg/dL
LDL Chol Calc (NIH): 101 mg/dL — ABNORMAL HIGH (ref 0–99)
Triglycerides: 75 mg/dL (ref 0–149)
VLDL Cholesterol Cal: 14 mg/dL (ref 5–40)

## 2024-06-14 LAB — CBC WITH DIFFERENTIAL/PLATELET
Basophils Absolute: 0 x10E3/uL (ref 0.0–0.2)
Basos: 1 %
EOS (ABSOLUTE): 0.1 x10E3/uL (ref 0.0–0.4)
Eos: 3 %
Hematocrit: 37 % (ref 34.0–46.6)
Hemoglobin: 12.2 g/dL (ref 11.1–15.9)
Immature Grans (Abs): 0 x10E3/uL (ref 0.0–0.1)
Immature Granulocytes: 0 %
Lymphocytes Absolute: 2 x10E3/uL (ref 0.7–3.1)
Lymphs: 44 %
MCH: 30.7 pg (ref 26.6–33.0)
MCHC: 33 g/dL (ref 31.5–35.7)
MCV: 93 fL (ref 79–97)
Monocytes Absolute: 0.6 x10E3/uL (ref 0.1–0.9)
Monocytes: 13 %
Neutrophils Absolute: 1.7 x10E3/uL (ref 1.4–7.0)
Neutrophils: 39 %
Platelets: 352 x10E3/uL (ref 150–450)
RBC: 3.97 x10E6/uL (ref 3.77–5.28)
RDW: 12.2 % (ref 11.7–15.4)
WBC: 4.4 x10E3/uL (ref 3.4–10.8)

## 2024-06-14 LAB — CMP14+EGFR
ALT: 20 IU/L (ref 0–32)
AST: 22 IU/L (ref 0–40)
Albumin: 4.1 g/dL (ref 3.8–4.8)
Alkaline Phosphatase: 107 IU/L (ref 49–135)
BUN/Creatinine Ratio: 23 (ref 12–28)
BUN: 19 mg/dL (ref 8–27)
Bilirubin Total: 0.3 mg/dL (ref 0.0–1.2)
CO2: 24 mmol/L (ref 20–29)
Calcium: 10.2 mg/dL (ref 8.7–10.3)
Chloride: 102 mmol/L (ref 96–106)
Creatinine, Ser: 0.83 mg/dL (ref 0.57–1.00)
Globulin, Total: 2.6 g/dL (ref 1.5–4.5)
Glucose: 88 mg/dL (ref 70–99)
Potassium: 4.2 mmol/L (ref 3.5–5.2)
Sodium: 140 mmol/L (ref 134–144)
Total Protein: 6.7 g/dL (ref 6.0–8.5)
eGFR: 73 mL/min/1.73

## 2024-06-14 LAB — TSH: TSH: 0.662 u[IU]/mL (ref 0.450–4.500)

## 2024-06-14 LAB — VITAMIN D 25 HYDROXY (VIT D DEFICIENCY, FRACTURES): Vit D, 25-Hydroxy: 39.1 ng/mL (ref 30.0–100.0)

## 2024-06-19 ENCOUNTER — Ambulatory Visit (INDEPENDENT_AMBULATORY_CARE_PROVIDER_SITE_OTHER): Admitting: Family Medicine

## 2024-06-19 ENCOUNTER — Encounter: Payer: Self-pay | Admitting: Family Medicine

## 2024-06-19 VITALS — BP 126/80 | HR 84 | Resp 16 | Ht 66.0 in | Wt 198.0 lb

## 2024-06-19 DIAGNOSIS — Z0001 Encounter for general adult medical examination with abnormal findings: Secondary | ICD-10-CM | POA: Insufficient documentation

## 2024-06-19 NOTE — Progress Notes (Signed)
" ° ° ° ° °  Karen [name]     MRN: [id number]      DOB: 10-21-1945  Chief Complaint  Patient presents with   Annual Exam    HPI: Patient is in for annual physical exam. No other health concerns are expressed or addressed at the visit. Recent labs,  are reviewed. Immunization is reviewed , and  updated if needed.   PE: BP 126/80   Pulse 84   Resp 16   Ht 5' 6 (1.676 m)   Wt 198 lb (89.8 kg)   SpO2 97%   BMI 31.96 kg/m   Pleasant  female, alert and oriented x 3, in no cardio-pulmonary distress. Afebrile. HEENT No facial trauma or asymetry. Sinuses non tender.  Extra occullar muscles intact.. External ears normal, . Neck: supple, no adenopathy,JVD or thyromegaly.No bruits.  Chest: Clear to ascultation bilaterally.No crackles or wheezes. Non tender to palpation   Cardiovascular system; Heart sounds normal,  S1 and  S2 ,no S3.  No murmur, or thrill. Apical beat not displaced Peripheral pulses normal.  Abdomen: Soft, non tender, no organomegaly or masses. No bruits. Bowel sounds normal. No guarding, tenderness or rebound.    Musculoskeletal exam: Decreased  ROM of spine, hips , shoulders and knees.  deformity ,swelling or crepitus noted. No muscle wasting or atrophy.   Neurologic: Cranial nerves 2 to 12 intact. Power, tone ,sensation normal throughout. Mild  disturbance in gait. No tremor.  Skin: Intact, no ulceration, erythema , scaling or rash noted. Pigmentation normal throughout  Psych; Normal mood and affect. Judgement and concentration normal   Assessment & Plan:  Encounter for Medicare annual examination with abnormal findings Annual exam as documented. Immunization and cancer screening needs are specifically addressed at this visit.  "

## 2024-06-19 NOTE — Patient Instructions (Addendum)
 F/U in 41/2  months  No change in medication   Please get 2nd Covid vaccine and TdAP at your pharmacy  It is important that you exercise regularly at least 30 minutes 5 times a week. If you develop chest pain, have severe difficulty breathing, or feel very tired, stop exercising immediately and seek medical attention   Aim for 6 to 8 pound weight loss in next 4 months, if unable to lose weight call in to get re started on low dose phentermine   Recommend strongly cardiac CT scan

## 2024-06-19 NOTE — Assessment & Plan Note (Signed)
 Annual exam as documented. . Immunization and cancer screening needs are specifically addressed at this visit.

## 2024-06-22 ENCOUNTER — Other Ambulatory Visit: Payer: Self-pay | Admitting: Family Medicine

## 2024-09-10 ENCOUNTER — Ambulatory Visit

## 2024-11-14 ENCOUNTER — Ambulatory Visit: Payer: Self-pay | Admitting: Family Medicine
